# Patient Record
Sex: Female | Born: 1965
Health system: Southern US, Academic
[De-identification: ages and names within clinical notes are randomized; demographics above are authoritative.]

## PROBLEM LIST (undated history)

## (undated) ENCOUNTER — Encounter

## (undated) ENCOUNTER — Telehealth

## (undated) DIAGNOSIS — E785 Hyperlipidemia, unspecified: Secondary | ICD-10-CM

## (undated) DIAGNOSIS — G4489 Other headache syndrome: Secondary | ICD-10-CM

## (undated) DIAGNOSIS — D126 Benign neoplasm of colon, unspecified: Secondary | ICD-10-CM

## (undated) DIAGNOSIS — G43909 Migraine, unspecified, not intractable, without status migrainosus: Secondary | ICD-10-CM

## (undated) DIAGNOSIS — S86019A Strain of unspecified Achilles tendon, initial encounter: Secondary | ICD-10-CM

## (undated) DIAGNOSIS — K7689 Other specified diseases of liver: Secondary | ICD-10-CM

## (undated) DIAGNOSIS — K2970 Gastritis, unspecified, without bleeding: Secondary | ICD-10-CM

## (undated) DIAGNOSIS — K317 Polyp of stomach and duodenum: Secondary | ICD-10-CM

## (undated) DIAGNOSIS — G709 Myoneural disorder, unspecified: Secondary | ICD-10-CM

## (undated) DIAGNOSIS — E039 Hypothyroidism, unspecified: Secondary | ICD-10-CM

## (undated) DIAGNOSIS — M797 Fibromyalgia: Secondary | ICD-10-CM

## (undated) DIAGNOSIS — E079 Disorder of thyroid, unspecified: Secondary | ICD-10-CM

## (undated) DIAGNOSIS — K760 Fatty (change of) liver, not elsewhere classified: Secondary | ICD-10-CM

## (undated) HISTORY — DX: Fatty (change of) liver, not elsewhere classified: K76.0

## (undated) HISTORY — DX: Other headache syndrome: G44.89

## (undated) HISTORY — DX: Gastritis, unspecified, without bleeding: K29.70

## (undated) HISTORY — DX: Disorder of thyroid, unspecified: E07.9

## (undated) HISTORY — PX: ABDOMINAL HYSTERECTOMY: SHX81

## (undated) HISTORY — PX: PARTIAL HYSTERECTOMY: SHX80

## (undated) HISTORY — DX: Fibromyalgia: M79.7

## (undated) HISTORY — DX: Strain of unspecified achilles tendon, initial encounter: S86.019A

## (undated) HISTORY — DX: Polyp of stomach and duodenum: K31.7

## (undated) HISTORY — PX: SALPINGOOPHORECTOMY: SHX82

## (undated) HISTORY — DX: Benign neoplasm of colon, unspecified: D12.6

## (undated) HISTORY — PX: NASAL SEPTUM SURGERY: SHX37

## (undated) HISTORY — DX: Hypothyroidism, unspecified: E03.9

## (undated) HISTORY — DX: Myoneural disorder, unspecified: G70.9

## (undated) HISTORY — DX: Other specified diseases of liver: K76.89

## (undated) HISTORY — DX: Migraine, unspecified, not intractable, without status migrainosus: G43.909

## (undated) HISTORY — PX: TUBAL LIGATION: SHX77

## (undated) HISTORY — DX: Hyperlipidemia, unspecified: E78.5

## (undated) HISTORY — PX: CYSTOSCOPY: SUR368

## (undated) HISTORY — PX: DILATION AND CURETTAGE OF UTERUS: SHX78

## (undated) HISTORY — PX: CRUCIATE LIGAMENT REPAIR: SHX607

---

## 2011-03-31 ENCOUNTER — Other Ambulatory Visit (HOSPITAL_COMMUNITY)
Admission: RE | Admit: 2011-03-31 | Discharge: 2011-03-31 | Disposition: A | Discharge status: Home / Self Care | Payer: BC Managed Care – PPO | Source: Ambulatory Visit | Attending: Family Medicine | Admitting: Family Medicine

## 2011-03-31 DIAGNOSIS — Z01419 Encounter for gynecological examination (general) (routine) without abnormal findings: Secondary | ICD-10-CM | POA: Insufficient documentation

## 2012-05-31 ENCOUNTER — Other Ambulatory Visit: Payer: Self-pay | Admitting: Internal Medicine

## 2012-05-31 DIAGNOSIS — N644 Mastodynia: Secondary | ICD-10-CM

## 2012-06-05 ENCOUNTER — Ambulatory Visit
Admission: RE | Admit: 2012-06-05 | Discharge: 2012-06-05 | Disposition: A | Discharge status: Home / Self Care | Payer: BC Managed Care – PPO | Source: Ambulatory Visit | Attending: Internal Medicine | Admitting: Internal Medicine

## 2012-06-05 ENCOUNTER — Other Ambulatory Visit: Payer: BC Managed Care – PPO

## 2012-06-05 DIAGNOSIS — N644 Mastodynia: Secondary | ICD-10-CM

## 2016-03-13 ENCOUNTER — Ambulatory Visit: Attending: Family Medicine | Primary: Family Medicine

## 2016-03-13 ENCOUNTER — Ambulatory Visit

## 2016-03-13 ENCOUNTER — Inpatient Hospital Stay: Admit: 2016-03-13 | Discharge: 2016-03-14 | Primary: Family Medicine

## 2016-03-13 DIAGNOSIS — E875 Hyperkalemia: Principal | ICD-10-CM

## 2016-03-15 ENCOUNTER — Ambulatory Visit: Attending: Family Medicine | Primary: Family Medicine

## 2016-03-15 DIAGNOSIS — M797 Fibromyalgia: Principal | ICD-10-CM

## 2016-03-15 DIAGNOSIS — Z9189 Other specified personal risk factors, not elsewhere classified: Secondary | ICD-10-CM

## 2016-03-15 DIAGNOSIS — E063 Autoimmune thyroiditis: Secondary | ICD-10-CM

## 2016-03-15 DIAGNOSIS — M9901 Segmental and somatic dysfunction of cervical region: Secondary | ICD-10-CM

## 2016-03-15 DIAGNOSIS — Z6836 Body mass index (BMI) 36.0-36.9, adult: Secondary | ICD-10-CM

## 2016-03-15 DIAGNOSIS — S86019A Strain of unspecified Achilles tendon, initial encounter: Secondary | ICD-10-CM

## 2016-03-15 DIAGNOSIS — M9902 Segmental and somatic dysfunction of thoracic region: Secondary | ICD-10-CM

## 2016-03-15 DIAGNOSIS — Z8639 Personal history of other endocrine, nutritional and metabolic disease: Secondary | ICD-10-CM

## 2016-03-15 DIAGNOSIS — E039 Hypothyroidism, unspecified: Secondary | ICD-10-CM

## 2016-03-15 DIAGNOSIS — R3914 Feeling of incomplete bladder emptying: Secondary | ICD-10-CM

## 2016-03-15 DIAGNOSIS — E079 Disorder of thyroid, unspecified: Secondary | ICD-10-CM

## 2016-03-15 DIAGNOSIS — R102 Pelvic and perineal pain: Secondary | ICD-10-CM

## 2016-03-15 DIAGNOSIS — Z1231 Encounter for screening mammogram for malignant neoplasm of breast: Secondary | ICD-10-CM

## 2016-03-15 DIAGNOSIS — G43909 Migraine, unspecified, not intractable, without status migrainosus: Secondary | ICD-10-CM

## 2016-03-15 DIAGNOSIS — M9908 Segmental and somatic dysfunction of rib cage: Secondary | ICD-10-CM

## 2016-03-15 DIAGNOSIS — N946 Dysmenorrhea, unspecified: Secondary | ICD-10-CM

## 2016-03-15 NOTE — Patient Instructions
Look at the floor while you do this.  4. Stay in this position for 3-5 seconds.  5. Slowly lower your chest and your face to the floor.  GET HELP IF:  ? Your back pain gets a lot worse when you do an exercise.  ? Your back pain does not lessen 2 hours after you exercise.  If you have any of these problems, stop doing the exercises. Do not do them again unless your doctor says it is okay.  GET HELP RIGHT AWAY IF:  ? You have sudden, very bad back pain. If this happens, stop doing the exercises. Do not do them again unless your doctor says it is okay.     This information is not intended to replace advice given to you by your health care provider. Make sure you discuss any questions you have with your health care provider.     Document Released: 09/23/2010 Document Revised: 12/13/2015 Document Reviewed: 10/15/2014  Elsevier Interactive Patient Education ?2017 [employer]

## 2016-03-15 NOTE — Patient Instructions
Back Exercises  If you have pain in your back, do these exercises 2-3 times each day or as told by your doctor. When the pain goes away, do the exercises once each day, but repeat the steps more times for each exercise (do more repetitions). If you do not have pain in your back, do these exercises once each day or as told by your doctor.  EXERCISES  Single Knee to Chest  Do these steps 3-5 times in a row for each leg:  1. Lie on your back on a firm bed or the floor with your legs stretched out.  2. Bring one knee to your chest.  3. Hold your knee to your chest by grabbing your knee or thigh.  4. Pull on your knee until you feel a gentle stretch in your lower back.  5. Keep doing the stretch for 10-30 seconds.  6. Slowly let go of your leg and straighten it.  Pelvic Tilt  Do these steps 5-10 times in a row:  1. Lie on your back on a firm bed or the floor with your legs stretched out.  2. Bend your knees so they point up to the ceiling. Your feet should be flat on the floor.  3. Tighten your lower belly (abdomen) muscles to press your lower back against the floor. This will make your tailbone point up to the ceiling instead of pointing down to your feet or the floor.  4. Stay in this position for 5-10 seconds while you gently tighten your muscles and breathe evenly.  Cat-Cow  Do these steps until your lower back bends more easily:  1. Get on your hands and knees on a firm surface. Keep your hands under your shoulders, and keep your knees under your hips. You may put padding under your knees.  2. Let your head hang down, and make your tailbone point down to the floor so your lower back is round like the back of a cat.  3. Stay in this position for 5 seconds.  4. Slowly lift your head and make your tailbone point up to the ceiling so your back hangs low (sags) like the back of a cow.  5. Stay in this position for 5 seconds.  Press-Ups  Do these steps 5-10 times in a row:  1. Lie on your belly (face-down) on the floor.

## 2016-03-15 NOTE — Patient Instructions
2. Place your hands near your head, about shoulder-width apart.  3. While you keep your back relaxed and keep your hips on the floor, slowly straighten your arms to raise the top half of your body and lift your shoulders. Do not use your back muscles. To make yourself more comfortable, you may change where you place your hands.  4. Stay in this position for 5 seconds.  5. Slowly return to lying flat on the floor.  Bridges  Do these steps 10 times in a row:  1. Lie on your back on a firm surface.  2. Bend your knees so they point up to the ceiling. Your feet should be flat on the floor.  3. Tighten your butt muscles and lift your butt off of the floor until your waist is almost as high as your knees. If you do not feel the muscles working in your butt and the back of your thighs, slide your feet 1-2 inches farther away from your butt.  4. Stay in this position for 3-5 seconds.  5. Slowly lower your butt to the floor, and let your butt muscles relax.  If this exercise is too easy, try doing it with your arms crossed over your chest.  Belly Crunches  Do these steps 5-10 times in a row:  1. Lie on your back on a firm bed or the floor with your legs stretched out.  2. Bend your knees so they point up to the ceiling. Your feet should be flat on the floor.  3. Cross your arms over your chest.  4. Tip your chin a little bit toward your chest but do not bend your neck.  5. Tighten your belly muscles and slowly raise your chest just enough to lift your shoulder blades a tiny bit off of the floor.  6. Slowly lower your chest and your head to the floor.  Back Lifts  Do these steps 5-10 times in a row:  1. Lie on your belly (face-down) with your arms at your sides, and rest your forehead on the floor.  2. Tighten the muscles in your legs and your butt.  3. Slowly lift your chest off of the floor while you keep your hips on the floor. Keep the back of your head in line with the curve in your back.

## 2016-03-16 NOTE — Progress Notes
?   High Blood Pressure Father    ? Cancer Maternal Uncle    ? Heart Attack Paternal Uncle    ? Heart Attack Paternal Grandfather    ? Breast Cancer Neg Hx      Social History     Social History   ? Marital status: Married     Spouse name: N/A   ? Number of children: N/A   ? Years of education: N/A     Occupational History   ? Not on file.     Social History Main Topics   ? Smoking status: Former Smoker   ? Smokeless tobacco: Never Used   ? Alcohol use No      Comment: very rare   ? Drug use: No   ? Sexual activity: Yes     Partners: Male     Other Topics Concern   ? Not on file     Social History Narrative     Current Outpatient Prescriptions on File Prior to Visit   Medication Sig   ? cholecalciferol (VITAMIN D-3) 1000 UNITS Tablet Take 1,000 Units by mouth daily.   ? cyanocobalamin (VITAMIN B-12) 50 MCG Tablet Take by mouth daily. Pt does not know dose.   ? estradiol (ESTRACE) 0.1 MG/GM Cream Apply dime sized amount to vagina and urethra twice weekly at bedtime   ? levothyroxine (SYNTHROID, LEVOTHROID) 100 MCG Tablet Take 1 tablet by mouth daily.   ? [DISCONTINUED] PEG-3350 and electrolytes (GoLYTELY) 236 G Solution Reconstituted Use as directed     No current facility-administered medications on file prior to visit.      Allergies   Allergen Reactions   ? Shellfish Allergy Itching and Swelling   ? Azithromycin Rash     Takes benadryl with this medication.   ? Erythromycin Rash     Takes benadryl with this medication.         Review of Systems  Review of Systems  CONSTITUTIONAL: Appetite good, no fevers, improved fatigue or weight loss, no headache  CV: One episode of atypical chest pain, palpitations, PND or peripheral edema  RESPIRATORY: No cough, shortness of breath, wheezing or dyspnea  GI: No nausea/vomiting, change in bowel habits, blood in stool, dysphagia, heartburn or abdominal pain  GU: No dysuria, urgency or incontinence  MUSC: upper back pain   SKIN: resolved lesion on left cheek

## 2016-03-16 NOTE — Progress Notes
Subjective:   Angela Andersen is a 50 y.o. 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 She is drinking more water and feels like it helps.  She states she is working on it.    She had one episode of chest pain doing dishes in April 2017. She was doing dishes.   Improved with rest.  She was able to relax with breathing.  Had one episode very similar six years ago that per [name] was worked up and came back as nothing.  No current pain.  No pain with exercise or sob.  No primary relatives with heart disease    She is down 2lbs    We adjusted her thyroid medications. She had some pain for a couple months and was able to work through it. She is now euthyroid.  She has cut back on choclate.      She states the spot we froze is resolved    She has fibromyalgia.  She is not going to [facility].          The 10-year ASCVD risk score Denman George(Goff DC Jorge NyJr, et al., 2013) is: 1.2%    Values used to calculate the score:      Age: [age] years      Sex: Female      Is Non-Hispanic African American: No      Diabetic: No      Tobacco smoker: No      Systolic Blood Pressure: 118 mmHg      Is BP treated: No      HDL Cholesterol: 60 mg/dL      Total Cholesterol: 211 mg/dL      Results for Angela ElyBAKER, Sidney (MRN [id number]) as of 03/15/2016 09:43   Ref. Range 02/09/2016 07:50 02/24/2016 09:10 03/13/2016 10:10   SODIUM Latest Ref Range: 135 - 145 mmol/L 143 140 142   POTASSIUM, SERUM Latest Ref Range: 3.3 - 4.6 mmol/L 5.6 (H) 5.4 (H) 4.8 (H)   CHLORIDE, SERUM Latest Ref Range: 101 - 110 mmol/L 103 99 (L) 101   CARBON DIOXIDE TOTAL Latest Ref Range: 21 - 29 mmol/L 28 28 28    Urea Nitrogen Latest Ref Range: 6 - 22 mg/dL 11 8 12    CREATININE Latest Ref Range: 0.51 - 0.95 mg/dL 5.400.79 9.810.71 1.910.78   GLUCOSE, SERUM Latest Ref Range: 71 - 99 mg/dL 478100 (H) 88 71

## 2016-03-16 NOTE — Progress Notes
nRBC % Latest Ref Range: 0.0 - 1.0 % 0.0     Basophils % Latest Ref Range: 0 - 1 % 1.1 (H)     Neutrophils Absolute Latest Ref Range: 1.80 - 8.70 x10E3/uL 2.97     Lymphocytes Absolute Latest Units: x10E3/uL 1.84     Monocytes Absolute Latest Units: x10E3/uL 0.37     Eosinophils Absolute Latest Units: x10E3/uL 0.09     Basophils Absolute Latest Units: x10E3/uL 0.06     Absolute NRBC Count Unknown 0.00     CBC AND DIFFERENTIAL Unknown Rpt (A)       Past Medical History:   Diagnosis Date   ? Dysmenorrhea    ? Feeling of incomplete bladder emptying    ? Fibromyalgia    ? H/O hyperkalemia    ? History of general anesthesia complication     VERY sensitive to anesthesia   ? Hypothyroid    ? Migraines     hemiplegic migraine   ? Partial tear of Achilles tendon     left   ? Pelvic pain     before hysterectomy   ? Thyroid disease      Past Surgical History:   Procedure Laterality Date   ? COLONOSCOPY     ? COLONOSCOPY W/ OR W/O BIOPSY N/A 01/27/2016    COLONOSCOPY W/ OR W/O BIOPSY performed by Noel GeroldEid, Amalie F, [facility]   ? CYSTOSCOPY      Surgery Description: Cystoscopy (Diagnostic);  Problem Comments: 12/16/12 (Created by Conversion)   ? DILATION AND CURETTAGE OF UTERUS      Surgery Description: Dilation And Curettage;  Problem Comments: for heavy bleeding after second delivery (Created by Conversion)   ? HYSTERECTOMY     ? NASAL SEPTUM SURGERY      Surgery Description: Nasal Septal Deviation Repair;   (Created by Conversion)   ? OTHER SURGICAL HISTORY      Surgery Description: Laparoscopy With Total Hysterectomy;  Problem Comments: excision of endometriosis-12/16/12 (Created by Conversion)   ? SALPINGO-OOPHORECTOMY      Surgery Description: Salpingo-oophorectomy Left Side;  Problem Comments: 12/16/12 (Created by Conversion)   ? TUBAL LIGATION     ? TUBAL LIGATION      Surgery Description: Tubal Ligation;   (Created by Conversion)     Family History   Problem Relation Age of Onset   ? High Blood Pressure Mother

## 2016-03-16 NOTE — Progress Notes
2. Visit for screening mammogram Z12.31 V76.12 MAM Screening   3. Somatic dysfunction of cervical region M99.01 739.1    4. Somatic dysfunction of thoracic region M99.02 739.2    5. Somatic dysfunction of rib M99.08 739.8    6. BMI 36.0-36.9,adult Z68.36 V85.36    7. Chronic lymphocytic thyroiditis E06.3 245.2           Plan:   Plan   1.  Thyroid disorder  -Improved and euthyroid on synthroid 100mcg  -recheck labs in 12 weeks     2. Fibromyalgia  -stable  -improving with adjustment of thyroid  -pnt will self refer to [facility]  -work on 30 minutes of activity daily  -work on gluten free diet  -call if worsening  -email consider gabapentin or flexerill         3. Somatic dysfunction cspine tspine and ribs  -Verbal consent given to the patient who consents to the procedure  -Chaperone is: Milbert CoulterLatesha Harmon  -Osteopathic Manipulative Treatment using Soft tissue techniques, Muscle Energy (ME) and High Velocity Low Amplitude (HVLA)  to affected areas  -Pnt is 50 %better  -Stretches given  -Recommend foam rolling  -Pnt instructed to email or call if not improving or worsening consider further workup        4. Hep C screening ordered        5.  Hyperkalemia  -improved  -hydrate well  -consider hctz    No orders of the following type(s) were placed in this encounter: Medications.     Orders Placed This Encounter   Procedures   ? MAM Screening       Health Maintenance was reviewed. The patient's HM Topic list was:                                            Health Maintenance   Topic Date Due   ? DTaP,Tdap,and Td Vaccines (1 - Tdap) 06/15/2016 (Originally 12/19/1984)   ? Mammogram Discussion  06/15/2016 (Originally 10/24/2014)   ? Influenza Vaccine (1) 05/05/2016   ? Preventive Wellness Visit  02/08/2017   ? Colonoscopy  01/26/2021   ? Lipid Profile  02/08/2021   ? USPSTF HIV Risk Assessment  Completed   ? USPSTF Hepatitis C Screening  Completed     No changes made.

## 2016-03-16 NOTE — Progress Notes
CALCIUM, SERUM Latest Ref Range: 8.6 - 10.0 mg/dL 10.2 (H) 9.7 9.7   Calc Total Globuin Latest Units: gm/dL 2.8     ANION GAP Latest Ref Range: 4 - 16 mmol/L _0 BUN/Creatinine Ratio Latest Ref Range: 6.0 - 22.0 (calc) 13.9 11.3 15.4   TOTAL PROTEIN Latest Ref Range: 6.5 - 8.3 g/dL 7.2     ALBUMIN Latest Ref Range: 3.8 - 4.9 g/dL 4.4     ALBUMIN/GLOBULIN RATIO Latest Units: (calc) 1.6     AST Latest Ref Range: 14 - 33 IU/L 14     ALT Latest Ref Range: 10 - 42 IU/L 17     EGFR Latest Units: mL/min/1.73M2 >59 >59 >59   Total Bilirubin Latest Ref Range: 0.2 - 1.0 mg/dL 0.4     Alkaline Phosphatase Latest Ref Range: 35 - 104 IU/L 93     Osmolality Calc Unknown 284.5 277.1 281.4   Ferritin Latest Ref Range: 15.0 - 150.0 ng/ml 105.4     Cholesterol, Total Latest Ref Range: 100 - 199 mg/dL 211 (H)     HDL Latest Ref Range: >=60 mg/dL 60     LDL Calculated Latest Units: mg/dL 132     NON-HDL CHOLESTEROL Latest Units: mg/dL (calc) 151     Triglycerides Latest Ref Range: <=150 mg/dL 96     Vitamin B-12 Latest Ref Range: 211 - 946 pg/ml 369     T4, Total Latest Ref Range: 4.6 - 12 mcg/dL 9.1     TSH, 3RD GENERATION Latest Ref Range: 0.270 - 4.200 mIU/L 0.637     WBC Latest Ref Range: 4.5 - 11 x10E3/uL 5.35     RBC Latest Ref Range: 4.20 - 5.40 x10E6/uL 4.58     HEMOGLOBIN Latest Ref Range: 12.0 - 16.0 g/dL 13.3     HEMATOCRIT Latest Ref Range: 37.0 - 47.0 % 41.4     MCV Latest Ref Range: 82.0 - 101.0 fl 90.4     MCH Latest Ref Range: 27.0 - 34.0 pg 29.0     MCHC Latest Ref Range: 31.0 - 36.0 g/dL 32.1     RDW Latest Ref Range: 12.0 - 16.1 % 13.3     PLATELET COUNT Latest Ref Range: 140 - 440 thou/cu mm 340     MPV Latest Ref Range: 9.5 - 11.5 fl 10.9     Neutrophils Latest Ref Range: 34.0 - 73.0 % 55.5     IMMATURE GRANULOCYTES Latest Ref Range: 0.0 - 2.0 % 0.4     Lymphocytes % Latest Ref Range: 25.0 - 45.0 % 34.4     MONOCYTES Latest Ref Range: 2.0 - 6.0 % 6.9 (H)     EOSINOPHILS Latest Ref Range: 1.0 - 4.0 % 1.7

## 2016-03-16 NOTE — Progress Notes
NEURO: No dizziness, numbness, MS changes, motor weakness, or syncope    Objective:        VITAL SIGNS (all recorded)      Clinic Vitals       03/15/16 0854             Amb Encounter Vitals    Weight 101.2 kg (223 lb)    -MD at 03/15/16 0858       Height 1.676 m (5\' 6" )    -MD at 03/15/16 0858       BMI (Calculated) 36.07    -MD at 03/15/16 0858       BSA (Calculated - sq m) 2.17    -MD at 03/15/16 0858       BP 118/80    -MD at 03/15/16 0858       BP Location Left lower arm    -MD at 03/15/16 0858       Position Sitting    -MD at 03/15/16 0858       Pulse 79    -MD at 03/15/16 0858       Resp 16    -MD at 03/15/16 0858       Temp 36.4 ?C (97.5 ?F)    -MD at 03/15/16 0858       Temperature Source Oral    -MD at 03/15/16 0858       O2 Saturation 98 %    -MD at 03/15/16 0858       FiO2 Source RA    -MD at 03/15/16 0858       Pain Score Zero    -MD at 03/15/16 0858       Education/Communication Barriers?    Learning/Communication Barriers? No    -MD at 03/15/16 0858       Fall Risk Assessment    Had recent fall / Last 6 months? No recent fall    -MD at 03/15/16 0858       Does patient have a fear of falling? No    -MD at 03/15/16 [phone]         User Key  (r) = Recorded By, (t) = Taken By, (c) = Cosigned By    Initials Name Effective Dates    MD Kennis Carinaonovan, Mary A, MA 12/07/15 -         Physical Exam  GENERAL: Vitals reviewed and patient is in NAD  HEENT: Neck supple. No lymphadenopathy.   LUNGS: Clear to auscultation bilaterally, no wheezes with forced expiration, respirations unlabored.  HEART: Regular rate and rhythm,  no ectopic beats or murmur  ABDOMEN: Non-tender, no organomegaly, no palpable masses or lesions.   EXTREMITIES: No edema.    MUSC: C7-T4RlSl with rib dysfunction   NEURO: Gait normal.   MENTAL STATUS: Alert, normal MS. Answers all questions appropriately.        Procedures  none   Assessment:       ICD-10-CM ICD-9-CM    1. Fibromyalgia M79.7 729.1

## 2016-03-23 ENCOUNTER — Inpatient Hospital Stay: Admit: 2016-03-23 | Discharge: 2016-03-24 | Primary: Family Medicine

## 2016-03-23 DIAGNOSIS — Z1231 Encounter for screening mammogram for malignant neoplasm of breast: Principal | ICD-10-CM

## 2016-03-23 DIAGNOSIS — R921 Mammographic calcification found on diagnostic imaging of breast: Secondary | ICD-10-CM

## 2016-03-23 LAB — HM MAMMOGRAPHY

## 2016-06-04 DIAGNOSIS — K760 Fatty (change of) liver, not elsewhere classified: Secondary | ICD-10-CM

## 2016-06-04 HISTORY — DX: Fatty (change of) liver, not elsewhere classified: K76.0

## 2016-06-12 ENCOUNTER — Ambulatory Visit: Attending: Family Medicine | Primary: Family Medicine

## 2016-06-12 DIAGNOSIS — D649 Anemia, unspecified: Principal | ICD-10-CM

## 2016-06-12 DIAGNOSIS — E039 Hypothyroidism, unspecified: Secondary | ICD-10-CM

## 2016-06-12 LAB — BASIC METABOLIC PANEL
BUN: 12 (ref 4–21)
Creatinine: 0.8 (ref 0.5–1.1)
GLUCOSE: 94
POTASSIUM: 4.5 (ref 3.4–5.3)
SODIUM: 143 (ref 137–147)

## 2016-06-12 LAB — HEPATIC FUNCTION PANEL
ALT: 20 (ref 7–35)
AST: 18 (ref 13–35)
Alkaline Phosphatase: 94 (ref 25–125)
BILIRUBIN, TOTAL: 0.5

## 2016-06-12 LAB — CBC AND DIFFERENTIAL
HCT: 42 (ref 36–46)
Hemoglobin: 13 (ref 12.0–16.0)
Platelets: 336 (ref 150–399)
WBC: 5.4

## 2016-06-12 LAB — FERRITIN: Ferritin: 123.7

## 2016-06-12 LAB — LIPID PANEL
Cholesterol: 230 — AB (ref 0–200)
HDL: 65 (ref 35–70)
LDL Cholesterol: 140
Triglycerides: 124 (ref 40–160)

## 2016-06-12 LAB — VITAMIN B12: VITAMIN B 12: 336

## 2016-06-12 LAB — TSH: TSH: 1.49 (ref 0.41–5.90)

## 2016-06-12 LAB — T4, FREE: FREE T4: 1.3

## 2016-06-13 ENCOUNTER — Inpatient Hospital Stay: Admit: 2016-06-13 | Discharge: 2016-06-13 | Primary: Family Medicine

## 2016-06-15 ENCOUNTER — Ambulatory Visit: Attending: Family Medicine | Primary: Family Medicine

## 2016-06-15 DIAGNOSIS — E039 Hypothyroidism, unspecified: Secondary | ICD-10-CM

## 2016-06-15 DIAGNOSIS — R3914 Feeling of incomplete bladder emptying: Secondary | ICD-10-CM

## 2016-06-15 DIAGNOSIS — R0789 Other chest pain: Principal | ICD-10-CM

## 2016-06-15 DIAGNOSIS — M797 Fibromyalgia: Secondary | ICD-10-CM

## 2016-06-15 DIAGNOSIS — R5383 Other fatigue: Secondary | ICD-10-CM

## 2016-06-15 DIAGNOSIS — Z9189 Other specified personal risk factors, not elsewhere classified: Secondary | ICD-10-CM

## 2016-06-15 DIAGNOSIS — N3943 Post-void dribbling: Secondary | ICD-10-CM

## 2016-06-15 DIAGNOSIS — N946 Dysmenorrhea, unspecified: Secondary | ICD-10-CM

## 2016-06-15 DIAGNOSIS — Z8639 Personal history of other endocrine, nutritional and metabolic disease: Secondary | ICD-10-CM

## 2016-06-15 DIAGNOSIS — E079 Disorder of thyroid, unspecified: Secondary | ICD-10-CM

## 2016-06-15 DIAGNOSIS — G43909 Migraine, unspecified, not intractable, without status migrainosus: Secondary | ICD-10-CM

## 2016-06-15 DIAGNOSIS — S86019A Strain of unspecified Achilles tendon, initial encounter: Secondary | ICD-10-CM

## 2016-06-15 DIAGNOSIS — Z6836 Body mass index (BMI) 36.0-36.9, adult: Secondary | ICD-10-CM

## 2016-06-15 DIAGNOSIS — R102 Pelvic and perineal pain: Secondary | ICD-10-CM

## 2016-06-15 NOTE — Patient Instructions
Your chest pain may come and go, or it may stay constant.  DIAGNOSIS  Lab tests or other studies may be needed to find the cause of your pain. Your health care provider may have you take a test called an ambulatory ECG (electrocardiogram). An ECG records your heartbeat patterns at the time the test is performed. You may also have other tests, such as:  ? Transthoracic echocardiogram (TTE). During echocardiography, sound waves are used to create a picture of all of the heart structures and to look at how blood flows through your heart.  ? Transesophageal echocardiogram (TEE).?This is a more advanced imaging test that obtains images from inside your body. It allows your health care provider to see your heart in finer detail.  ? Cardiac monitoring. This allows your health care provider to monitor your heart rate and rhythm in real time.  ? Holter monitor. This is a portable device that records your heartbeat and can help to diagnose abnormal heartbeats. It allows your health care provider to track your heart activity for several days, if needed.  ? Stress tests. These can be done through exercise or by taking medicine that makes your heart beat more quickly.  ? Blood tests.  ? Imaging tests.  TREATMENT   Your treatment depends on what is causing your chest pain. Treatment may include:  ? Medicines. These may include:    Acid blockers for heartburn.    Anti-inflammatory medicine.    Pain medicine for inflammatory conditions.    Antibiotic medicine, if an infection is present.    Medicines to dissolve blood clots.    Medicines to treat coronary artery disease.  ? Supportive care for conditions that do not require medicines. This may include:    Resting.    Applying heat or cold packs to injured areas.    Limiting activities until pain decreases.  HOME CARE INSTRUCTIONS  ? If you were prescribed an antibiotic medicine, finish it all even if you start to feel better.  ? Avoid any activities that bring on chest pain.

## 2016-06-15 NOTE — Patient Instructions
Nonspecific Chest Pain   Chest pain can be caused by many different conditions. There is always a chance that your pain could be related to something serious, such as a heart attack or a blood clot in your lungs. Chest pain can also be caused by conditions that are not life-threatening. If you have chest pain, it is very important to follow up with your health care provider.  CAUSES   Chest pain can be caused by:  ? Heartburn.  ? Pneumonia or bronchitis.  ? Anxiety or stress.  ? Inflammation around your heart (pericarditis) or lung (pleuritis or pleurisy).  ? A blood clot in your lung.  ? A collapsed lung (pneumothorax). It can develop suddenly on its own (spontaneous pneumothorax) or from trauma to the chest.  ? Shingles infection (varicella-zoster virus).  ? Heart attack.  ? Damage to the bones, muscles, and cartilage that make up your chest wall. This can include:    Bruised bones due to injury.    Strained muscles or cartilage due to frequent or repeated coughing or overwork.    Fracture to one or more ribs.    Sore cartilage due to inflammation (costochondritis).  RISK FACTORS   Risk factors for chest pain may include:  ? Activities that increase your risk for trauma or injury to your chest.  ? Respiratory infections or conditions that cause frequent coughing.  ? Medical conditions or overeating that can cause heartburn.  ? Heart disease or family history of heart disease.  ? Conditions or health behaviors that increase your risk of developing a blood clot.  ? Having had chicken pox (varicella zoster).  SIGNS AND SYMPTOMS  Chest pain can feel like:  ? Burning or tingling on the surface of your chest or deep in your chest.  ? Crushing, pressure, aching, or squeezing pain.  ? Dull or sharp pain that is worse when you move, cough, or take a deep breath.  ? Pain that is also felt in your back, neck, shoulder, or arm, or pain that spreads to any of these areas.

## 2016-06-15 NOTE — Patient Instructions
?   Do not use any tobacco products, including cigarettes, chewing tobacco, or electronic cigarettes. If you need help quitting, ask your health care provider.  ? Do not drink alcohol.  ? Take medicines only as directed by your health care provider.  ? Keep all follow-up visits as directed by your health care provider. This is important. This includes any further testing if your chest pain does not go away.  ? If heartburn is the cause for your chest pain, you may be told to keep your head raised (elevated) while sleeping. This reduces the chance that acid will go from your stomach into your esophagus.  ? Make lifestyle changes as directed by your health care provider. These may include:    Getting regular exercise. Ask your health care provider to suggest some activities that are safe for you.    Eating a heart-healthy diet. A registered dietitian can help you to learn healthy eating options.    Maintaining a healthy weight.    Managing diabetes, if necessary.    Reducing stress.  SEEK MEDICAL CARE IF:  ? Your chest pain does not go away after treatment.  ? You have a rash with blisters on your chest.  ? You have a fever.  SEEK IMMEDIATE MEDICAL CARE IF:   ? Your chest pain is worse.  ? You have an increasing cough, or you cough up blood.  ? You have severe abdominal pain.  ? You have severe weakness.  ? You faint.  ? You have chills.  ? You have sudden, unexplained chest discomfort.  ? You have sudden, unexplained discomfort in your arms, back, neck, or jaw.  ? You have shortness of breath at any time.  ? You suddenly start to sweat, or your skin gets clammy.  ? You feel nauseous or you vomit.  ? You suddenly feel light-headed or dizzy.  ? Your heart begins to beat quickly, or it feels like it is skipping beats.  These symptoms may represent a serious problem that is an emergency. Do not wait to see if the symptoms will go away. Get medical help right away.

## 2016-06-15 NOTE — Patient Instructions
Call your local emergency services (911 in the [place] Do not drive yourself to the hospital.     This information is not intended to replace advice given to you by your health care provider. Make sure you discuss any questions you have with your health care provider.     Document Released: 05/31/2005 Document Revised: 09/11/2014 Document Reviewed: 03/27/2014  Elsevier Interactive Patient Education ?2017 [employer]

## 2016-06-16 NOTE — Progress Notes
?   Not on file.     Social History Main Topics   ? Smoking status: Former Smoker   ? Smokeless tobacco: Never Used   ? Alcohol use No      Comment: very rare   ? Drug use: No   ? Sexual activity: Yes     Partners: Male     Other Topics Concern   ? Not on file     Social History Narrative     Current Outpatient Prescriptions on File Prior to Visit   Medication Sig   ? cholecalciferol (VITAMIN D-3) 1000 UNITS Tablet Take 1,000 Units by mouth daily.   ? cyanocobalamin (VITAMIN B-12) 50 MCG Tablet Take by mouth daily. Pt does not know dose.   ? estradiol (ESTRACE) 0.1 MG/GM Cream Apply dime sized amount to vagina and urethra twice weekly at bedtime   ? levothyroxine (SYNTHROID, LEVOTHROID) 100 MCG Tablet Take 1 tablet by mouth daily.     No current facility-administered medications on file prior to visit.      Allergies   Allergen Reactions   ? Shellfish Allergy Itching and Swelling   ? Azithromycin Rash     Takes benadryl with this medication.   ? Erythromycin Rash     Takes benadryl with this medication.         Review of Systems  Review of Systems  CONSTITUTIONAL: Appetite good, no fevers, worse daytime fatigue or weight loss, no headache  CV: Last week she had one episode of atypical chest pain, palpitations, PND or peripheral edema  RESPIRATORY: No cough, shortness of breath, wheezing or dyspnea  GI: No nausea/vomiting, change in bowel habits, blood in stool, dysphagia, heartburn or abdominal pain  GU: No dysuria, urgency increased smell and post void occasional incontinence  MUSC: + occasional myalgias  NEURO: No dizziness, numbness, MS changes, motor weakness, or syncope    Objective:        VITAL SIGNS (all recorded)      Clinic Vitals       06/15/16 0842             Amb Encounter Vitals    Weight 101.2 kg (223 lb)    -LD at 06/15/16 0843       Height 1.676 m (5\' 6" )    -LD at 06/15/16 0843       BMI (Calculated) 36.07    -LD at 06/15/16 0843       BSA (Calculated - sq m) 2.17    -LD at 06/15/16 [id number]

## 2016-06-16 NOTE — Progress Notes
BP 124/79    -LD at 06/15/16 0843       BP Location Right upper arm    -LD at 06/15/16 0843       Position Sitting    -LD at 06/15/16 0843       Pulse 68    -LD at 06/15/16 0843       Resp 14    -LD at 06/15/16 0843       Temp 36.2 ?C (97.1 ?F)    -LD at 06/15/16 0843       Temperature Source Oral    -LD at 06/15/16 0843       O2 Saturation 99 %    -LD at 06/15/16 0843       Pain Score Zero    -LD at 06/15/16 16100843         User Key  (r) = Recorded By, (t) = Taken By, (c) = Cosigned By    Initials Name Effective Dates    LD Cordelia Pocheavis, [place], MA 12/07/15 -         Physical Exam  GENERAL: Vitals reviewed and patient is in NAD  Pulse ox 99% on RA BMI 36  HEENT: Neck supple. No lymphadenopathy.   LUNGS: Clear to auscultation bilaterally, no wheezes with forced expiration, respirations unlabored.  HEART: Regular rate and rhythm,  no ectopic beats or murmur  ABDOMEN: Mild suprapubic ttp no r/r/g no organomegaly, no palpable masses or lesions.   EXTREMITIES: No edema.    NEURO: Gait normal.   MENTAL STATUS: Alert, normal MS. Answers all questions appropriately.        Procedures  none   Assessment:       ICD-10-CM ICD-9-CM    1. Atypical chest pain R07.89 786.59 Refer to Cardiology   2. Fatigue, unspecified type R53.83 780.79    3. BMI 36.0-36.9,adult Z68.36 V85.36    4. Post-void dribbling N39.43 788.35 Refer to Physical Therapy      Urinalysis W/Microscopy      Culture, Urine   5. Fibromyalgia M79.7 729.1 Refer to Physical Therapy   6. Hypothyroidism, unspecified type E03.9 244.9           Plan:   Plan   1.  Thyroid disorder  -Euthyroid  -continue synthroid 100mcg  -recheck labs in 12 weeks     2. Fibromyalgia  -stable  -doing well   -work on 30 minutes of activity daily  -work on gluten free diet  -call if worsening  -email consider gabapentin or flexerill         3. Chest pain second episode since April and increasing daytime fatigue  -no current symptoms  -refer to cardiology  -ekg NSR v3 has borderline t waves

## 2016-06-16 NOTE — Progress Notes
-  reviewed limits of cardio workup at our office  -start baby asa  -er precautions given  -if cardiac workup neg consider sleep eval   -no current complaints         4.  Urine has ammonia smell and has some incontinence  -urine culture and UA today  -refer to pelvic pt  -consider urogyn   Patient given precautions that illnesses may evolve and that they should seek immediate medical treatment if they have:  Worsening fever   Shortness of breath   Vomiting and inability to keep down liquids   Severe pain or worsening pain   Mental status changes  No improvement in 3-5 days   Worsening of symptoms   Any adverse affects they feel is due to any of their current medications     5. BMI 36  Dimitra's Estimated body mass index is 35.99 kg/(m^2) as calculated from the following:    Height as of this encounter: 1.676 m (5\' 6" ).    Weight as of this encounter: 101.2 kg (223 lb).      The health risks associated with an elevated BMI were discussed and education was provided in the AVS.  See orders for any further follow up plans.        No orders of the following type(s) were placed in this encounter: Medications.     Orders Placed This Encounter   Procedures   ? Culture, Urine   ? Urinalysis W/Microscopy   ? Refer to Cardiology   ? Refer to Physical Therapy       Health Maintenance was reviewed. The patient's HM Topic list was:                                            Health Maintenance   Topic Date Due   ? DTaP,Tdap,and Td Vaccines (1 - Tdap) 06/15/2016 (Originally [birth year]19/1984)   ? Preventive Wellness Visit  02/08/2017   ? Breast Cancer Screening  03/23/2018   ? Lipid Profile  06/12/2021   ? Colon Cancer Screening  01/26/2026   ? USPSTF HIV Risk Assessment  Completed   ? USPSTF Hepatitis C Screening  Completed   ? Influenza Vaccine  Completed     No changes made.

## 2016-06-16 NOTE — Progress Notes
Electronically Verified By Cristal Deer- Cassandra Fernandez M.D.    Released Date Time - 03/23/2016 10:33 AM    Resident -   ?  Past Medical History:   Diagnosis Date   ? Dysmenorrhea    ? Feeling of incomplete bladder emptying    ? Fibromyalgia    ? H/O hyperkalemia    ? History of general anesthesia complication     VERY sensitive to anesthesia   ? Hypothyroid    ? Migraines     hemiplegic migraine   ? Partial tear of Achilles tendon     left   ? Pelvic pain     before hysterectomy   ? Thyroid disease      Past Surgical History:   Procedure Laterality Date   ? COLONOSCOPY     ? COLONOSCOPY W/ OR W/O BIOPSY N/A 01/27/2016    COLONOSCOPY W/ OR W/O BIOPSY performed [facility] Noel GeroldEid, Amalie F[facility]   ? CYSTOSCOPY      Surgery Description: Cystoscopy (Diagnostic);  Problem Comments: 12/16/12 (Created by Conversion)   ? DILATION AND CURETTAGE OF UTERUS      Surgery Description: Dilation And Curettage;  Problem Comments: for heavy bleeding after second delivery (Created by Conversion)   ? HYSTERECTOMY     ? NASAL SEPTUM SURGERY      Surgery Description: Nasal Septal Deviation Repair;   (Created by Conversion)   ? OTHER SURGICAL HISTORY      Surgery Description: Laparoscopy With Total Hysterectomy;  Problem Comments: excision of endometriosis-12/16/12 (Created by Conversion)   ? SALPINGO-OOPHORECTOMY      Surgery Description: Salpingo-oophorectomy Left Side;  Problem Comments: 12/16/12 (Created by Conversion)   ? TUBAL LIGATION     ? TUBAL LIGATION      Surgery Description: Tubal Ligation;   (Created by Conversion)     Family History   Problem Relation Age of Onset   ? High Blood Pressure Mother    ? High Blood Pressure Father    ? Cancer Maternal Uncle    ? Heart Attack Paternal Uncle    ? Heart Attack Paternal Grandfather    ? Breast Cancer Neg Hx      Social History     Social History   ? Marital status: Married     Spouse name: N/A   ? Number of children: N/A   ? Years of education: N/A     Occupational History

## 2016-06-16 NOTE — Progress Notes
CALCIUM, SERUM Latest Ref Range: 8.6 - 10.0 mg/dL 10.2 (H) 9.7 9.7  10.1 (H)   Calc Total Globuin Latest Units: gm/dL 2.8    3.1   ANION GAP Latest Ref Range: 4 - 16 mmol/L _0 (H)   BUN/Creatinine Ratio Latest Ref Range: 6.0 - 22.0 (calc) 13.9 11.3 15.4  15.8   TOTAL PROTEIN Latest Ref Range: 6.5 - 8.3 g/dL 7.2    7.4   ALBUMIN Latest Ref Range: 3.8 - 4.9 g/dL 4.4    4.3   ALBUMIN/GLOBULIN RATIO Latest Units: (calc) 1.6    1.4   AST Latest Ref Range: 14 - 33 IU/L 14    18   ALT Latest Ref Range: 10 - 42 IU/L 17    20   EGFR Latest Units: mL/min/1.73M2 >59 >59 >59  >59   Total Bilirubin Latest Ref Range: 0.2 - 1.0 mg/dL 0.4    0.5   Alkaline Phosphatase Latest Ref Range: 35 - 104 IU/L 93    94   Osmolality Calc Unknown 284.5 277.1 281.4  284.5   Ferritin Latest Ref Range: 15.0 - 150.0 ng/ml 105.4    123.7   Cholesterol, Total Latest Ref Range: 100 - 199 mg/dL 211 (H)    230 (H)   HDL Latest Ref Range: >=60 mg/dL 60    65   LDL Calculated Latest Units: mg/dL 132    140   NON-HDL CHOLESTEROL Latest Units: mg/dL (calc) 151    165   Triglycerides Latest Ref Range: <=150 mg/dL 96    124   Vitamin B-12 Latest Ref Range: 211 - 946 pg/ml 369    336   Free T4 Latest Ref Range: 0.80 - 1.70 ng/dL     1.30   T4, Total Latest Ref Range: 4.6 - 12 mcg/dL 9.1       TSH, 3RD GENERATION Latest Ref Range: 0.270 - 4.200 mIU/L 0.637    1.490   WBC Latest Ref Range: 4.5 - 11 x10E3/uL 5.35    5.35   RBC Latest Ref Range: 4.20 - 5.40 x10E6/uL 4.58    4.69   HEMOGLOBIN Latest Ref Range: 12.0 - 16.0 g/dL 13.3    13.0   HEMATOCRIT Latest Ref Range: 37.0 - 47.0 % 41.4    42.3   MCV Latest Ref Range: 82.0 - 101.0 fl 90.4    90.2   MCH Latest Ref Range: 27.0 - 34.0 pg 29.0    27.7   MCHC Latest Ref Range: 31.0 - 36.0 g/dL 32.1    30.7 (L)   RDW Latest Ref Range: 12.0 - 16.1 % 13.3    13.8   PLATELET COUNT Latest Ref Range: 140 - 440 thou/cu mm 340    336   MPV Latest Ref Range: 9.5 - 11.5 fl 10.9    10.6

## 2016-06-16 NOTE — Progress Notes
Subjective:   Angela Andersen is a 50 y.o. 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  She has hypothyroidism.  TSH is 1.49    She has hyperlipidemia.  Her 10 year cardiac risk is 1.4%.  LDL is 140    She has no one more episode of chest pain.  She had one stabbing episode of chest pain last week.  Had last episode in April. She can relax it with breathing.     She has no current complaints.  No sob or chest pain with activity. She gets tired with activity.  She is napping some more than usual. She is not snoring at all per her husband     She had a flu shot at work    She has fibromyalgia.  She states she is stable. She is sleeping better. Wakes up with lots of energy. During the day she gets tired midday        The 10-year ASCVD risk score Denman George(Goff DC Jorge NyJr, et al., 2013) is: 1.4%    Values used to calculate the score:      Age: [age] years      Sex: Female      Is Non-Hispanic African American: No      Diabetic: No      Tobacco smoker: No      Systolic Blood Pressure: 124 mmHg      Is BP treated: No      HDL Cholesterol: 65 mg/dL      Total Cholesterol: 230 mg/dL      Results for Angela Andersen, Angela Andersen (MRN [id number]) as of 06/15/2016 09:10   Ref. Range 02/09/2016 07:50 02/24/2016 09:10 03/13/2016 10:10 03/23/2016 08:31 06/12/2016 07:45   SODIUM Latest Ref Range: 135 - 145 mmol/L 143 140 142  143   POTASSIUM, SERUM Latest Ref Range: 3.3 - 4.6 mmol/L 5.6 (H) 5.4 (H) 4.8 (H)  4.5   CHLORIDE, SERUM Latest Ref Range: 101 - 110 mmol/L 103 99 (L) 101  101   CARBON DIOXIDE TOTAL Latest Ref Range: 21 - 29 mmol/L 28 28 28  24    Urea Nitrogen Latest Ref Range: 6 - 22 mg/dL 11 8 12  12    CREATININE Latest Ref Range: 0.51 - 0.95 mg/dL 8.290.79 5.620.71 1.300.78  8.650.76   GLUCOSE, SERUM Latest Ref Range: 71 - 99 mg/dL 784100 (H) 88 71  94

## 2016-06-16 NOTE — Progress Notes
Neutrophils Latest Ref Range: 34.0 - 73.0 % 55.5    59.8   IMMATURE GRANULOCYTES Latest Ref Range: 0.0 - 2.0 % 0.4    0.4   Lymphocytes % Latest Ref Range: 25.0 - 45.0 % 34.4    32.0   MONOCYTES Latest Ref Range: 2.0 - 6.0 % 6.9 (H)    6.0   EOSINOPHILS Latest Ref Range: 1.0 - 4.0 % 1.7    1.1   nRBC % Latest Ref Range: 0.0 - 1.0 % 0.0    0.0   Basophils % Latest Ref Range: 0 - 1 % 1.1 (H)    0.7   Neutrophils Absolute Latest Ref Range: 1.80 - 8.70 x10E3/uL 2.97    3.20   Lymphocytes Absolute Latest Units: x10E3/uL 1.84    1.71   Monocytes Absolute Latest Units: x10E3/uL 0.37    0.32   Eosinophils Absolute Latest Units: x10E3/uL 0.09    0.06   Basophils Absolute Latest Units: x10E3/uL 0.06    0.04   Absolute NRBC Count Unknown 0.00    0.00   CBC AND DIFFERENTIAL Unknown Rpt (A)    Rpt (A)         Bilateral Digital Screening Mammogram   ?  Comparison: 2015  ?  History: 50 year old female with no current complaints.  ?  Routine digital cranial caudal and MLO views were obtained.  ?  Findings: Scattered fibroglandular densities are seen.  No masses are identified.  There are no   abnormal clustered microcalcifications.  A few scattered benign-appearing calcifications are   noted.No evidence of architectural distortion or skin thickening identified.  ?  CAD was also utilized.  ?  IMPRESSION:  Impression: BIRADS:2, benign findings.  Annual mammography is recommended.  ?  ?  NOTE:  ?  An x-ray report which is negative for cancer should not delay biopsy if a dominant or   clinically suspicious mass is present.  4  to 8% of cancers are not are not identified by   x-ray.  ?  A negative report may reinforce clinical impression.  ?  Adenosis or fibrocystic disease ( mammary dysplasia )may obscure an underlying neoplasm.  ?  False positive reports average 6 to 10%.  ?  Written notification of the findings and follow-up recommendations were mailed to the patient.  ?  Read By Stephanie Coup- Cassandra Fernandez M.D.

## 2016-06-19 ENCOUNTER — Inpatient Hospital Stay: Admit: 2016-06-19 | Discharge: 2016-06-19

## 2016-06-19 ENCOUNTER — Ambulatory Visit: Attending: Cardiovascular Disease | Primary: Family Medicine

## 2016-06-19 ENCOUNTER — Emergency Department: Admit: 2016-06-19 | Discharge: 2016-06-19

## 2016-06-19 DIAGNOSIS — E039 Hypothyroidism, unspecified: Secondary | ICD-10-CM

## 2016-06-19 DIAGNOSIS — Z8639 Personal history of other endocrine, nutritional and metabolic disease: Secondary | ICD-10-CM

## 2016-06-19 DIAGNOSIS — E079 Disorder of thyroid, unspecified: Principal | ICD-10-CM

## 2016-06-19 DIAGNOSIS — Z91013 Allergy to seafood: Secondary | ICD-10-CM

## 2016-06-19 DIAGNOSIS — R102 Pelvic and perineal pain: Secondary | ICD-10-CM

## 2016-06-19 DIAGNOSIS — N946 Dysmenorrhea, unspecified: Secondary | ICD-10-CM

## 2016-06-19 DIAGNOSIS — M797 Fibromyalgia: Secondary | ICD-10-CM

## 2016-06-19 DIAGNOSIS — Z79899 Other long term (current) drug therapy: Secondary | ICD-10-CM

## 2016-06-19 DIAGNOSIS — S86019A Strain of unspecified Achilles tendon, initial encounter: Secondary | ICD-10-CM

## 2016-06-19 DIAGNOSIS — G43909 Migraine, unspecified, not intractable, without status migrainosus: Secondary | ICD-10-CM

## 2016-06-19 DIAGNOSIS — E785 Hyperlipidemia, unspecified: Secondary | ICD-10-CM

## 2016-06-19 DIAGNOSIS — Z87891 Personal history of nicotine dependence: Secondary | ICD-10-CM

## 2016-06-19 DIAGNOSIS — R0609 Other forms of dyspnea: Secondary | ICD-10-CM

## 2016-06-19 DIAGNOSIS — E669 Obesity, unspecified: Secondary | ICD-10-CM

## 2016-06-19 DIAGNOSIS — R079 Chest pain, unspecified: Principal | ICD-10-CM

## 2016-06-19 DIAGNOSIS — Z7982 Long term (current) use of aspirin: Secondary | ICD-10-CM

## 2016-06-19 DIAGNOSIS — R0602 Shortness of breath: Secondary | ICD-10-CM

## 2016-06-19 DIAGNOSIS — R0789 Other chest pain: Principal | ICD-10-CM

## 2016-06-19 DIAGNOSIS — R61 Generalized hyperhidrosis: Secondary | ICD-10-CM

## 2016-06-19 DIAGNOSIS — Z9189 Other specified personal risk factors, not elsewhere classified: Secondary | ICD-10-CM

## 2016-06-19 DIAGNOSIS — R3914 Feeling of incomplete bladder emptying: Secondary | ICD-10-CM

## 2016-06-19 DIAGNOSIS — R9431 Abnormal electrocardiogram [ECG] [EKG]: Secondary | ICD-10-CM

## 2016-06-19 DIAGNOSIS — Z6835 Body mass index (BMI) 35.0-35.9, adult: Secondary | ICD-10-CM

## 2016-06-19 LAB — CBC AND DIFFERENTIAL
HCT: 37 (ref 36–46)
Hemoglobin: 12.4 (ref 12.0–16.0)
NEUTROS ABS: 4
PLATELETS: 327 (ref 150–399)
WBC: 6.7

## 2016-06-19 LAB — BASIC METABOLIC PANEL
BUN: 11 (ref 4–21)
Creatinine: 0.8 (ref <=1.1)
GLUCOSE: 87
POTASSIUM: 4.1 (ref 3.4–5.3)
SODIUM: 139 (ref 137–147)

## 2016-06-19 LAB — TSH: TSH: 1.05 (ref <=5.90)

## 2016-06-19 MED ORDER — ASPIRIN 81 MG PO TABS
Freq: Every day | ORAL
Start: 2016-06-19 — End: 2016-09-19

## 2016-06-19 MED ORDER — ASPIRIN 325 MG PO TABS
325 mg | Freq: Once | ORAL | Status: CP
Start: 2016-06-19 — End: ?

## 2016-06-19 MED ORDER — BOLUS IV FLUID JX
Freq: Once | INTRAVENOUS | Status: CP
Start: 2016-06-19 — End: ?

## 2016-06-19 NOTE — Progress Notes
she has had a few episodes of left sided chest pain, described as sharp across the left anterior chest and retrosternal pressure, radiated to the neck, unrelated with exertion, occasionally associated with dyspnea, and diaphoresis. Her last episode was actually today in the morning and [facility] where she had negative cardiac enzymes and unchanged ECG and was discharged to follow up in the office. She is asymptomatic now. She also admits dyspnea on exertion, NYHA functional class II, and occasional palpitations, lightheadedness, and dizziness. She denies syncope, claudication, orthopnea, cough, bleeding or other crdiovascular symptoms. Her cardiovascular exam is unremarkable except for a soft systolic murmur, she is not on heart failure and is asymptomatic now. Some of the characteristics of her pain are typical but her pain is unrelted with exertion. However, given her risk factors and abnormal ECG, I will order an exercise nuclear stress test and a transthoracic ecocardiogram to rule out ischemia or structural or valvular disease. She will continue ASA 81 mg daily. Advised on coming back to the ER if her symptoms worsen.      - Ordered an exercise nuclear stress test and transthoracic echocardiogram.  - Continue ASA 81 mg daily.  - RTC in 4 weeks.       2. Dyslipidemia. LDL 140.  - She will like to try diet and exercise before starting statin therapy.       Plan:     No orders of the following type(s) were placed in this encounter: Medications.     Orders Placed This Encounter   Procedures   ? NM Myocard Perfusion Comp Exercise Stres   ? Echo-TTE complete       Health Maintenance was reviewed. The patient's HM Topic list was:                                            Health Maintenance   Topic Date Due   ? DTaP,Tdap,and Td Vaccines (1 - Tdap) 12/19/1982   ? Preventive Wellness Visit  02/08/2017   ? Breast Cancer Screening  03/23/2018   ? Lipid Profile  06/12/2021

## 2016-06-19 NOTE — ED Notes
Bed: ED-24  Expected date: 06/19/16  Expected time:   Means of arrival:   Comments:  Hold first

## 2016-06-19 NOTE — Progress Notes
?   Colon Cancer Screening  01/26/2026   ? USPSTF HIV Risk Assessment  Completed   ? USPSTF Hepatitis C Screening  Completed   ? Influenza Vaccine  Completed       Return in about 4 weeks (around 07/17/2016).

## 2016-06-19 NOTE — Patient Instructions
?   Certain medicines, including birth control pills, diuretics, beta-blockers, and some medicines for depression.  ? Smoking.  ? Eating a high-fat diet.  ? Being overweight.  ? Medical conditions such as diabetes, polycystic ovary syndrome, pregnancy, kidney disease, and hypothyroidism.  ? Lack of regular exercise.  SIGNS AND SYMPTOMS  There are no signs or symptoms with dyslipidemia.  DIAGNOSIS  A simple blood test called a fasting blood test can be done to determine your level of:  ? Total cholesterol. This is the combined number of LDL cholesterol and HDL cholesterol. A healthy number is lower than 200.  ? LDL cholesterol. The goal number for LDL cholesterol is different for each person depending on risk factors. Ask your health care provider what your LDL cholesterol number should be.  ? HDL cholesterol. A healthy level of HDL cholesterol is 60 or higher. A number lower than 40 for men or 50 for women is a danger sign.  ? Triglycerides. A healthy triglyceride number is less than 150.  TREATMENT  Dyslipidemia is a treatable condition. Your health care provider will advise you on what type of treatment is best based on your age, your test results, and current guidelines. Treatment may include:  ? Dietary changes. A dietitian may help you create a diet that is based on your risk factors, conditions, and lifestyle.  ? Regular exercise. This can help lower your LDL cholesterol, raise your HDL cholesterol, and help with weight management. Check with your health care provider before beginning an exercise program. Most people should participate in 30 minutes of brisk exercise 5 days a week.  ? Quitting smoking.  ? Medicines to lower LDL cholesterol and triglycerides.  ? If you have high levels of triglycerides, your health care provider may:    Have you stop drinking alcohol.    Have you restrict your fat intake.    Have you eliminate refined sugars from your diet.

## 2016-06-19 NOTE — Progress Notes
bleeding after second delivery (Created by Conversion)   ? HYSTERECTOMY     ? NASAL SEPTUM SURGERY      Surgery Description: Nasal Septal Deviation Repair;   (Created by Conversion)   ? OTHER SURGICAL HISTORY      Surgery Description: Laparoscopy With Total Hysterectomy;  Problem Comments: excision of endometriosis-12/16/12 (Created by Conversion)   ? SALPINGO-OOPHORECTOMY      Surgery Description: Salpingo-oophorectomy Left Side;  Problem Comments: 12/16/12 (Created by Conversion)   ? TUBAL LIGATION     ? TUBAL LIGATION      Surgery Description: Tubal Ligation;   (Created by Conversion)     Family History   Problem Relation Age of Onset   ? High Blood Pressure Mother    ? High Blood Pressure Father    ? Cancer Maternal Uncle    ? Heart Attack Paternal Uncle    ? Heart Attack Paternal Grandfather    ? Breast Cancer Neg Hx      Social History     Social History   ? Marital status: Married     Spouse name: N/A   ? Number of children: N/A   ? Years of education: N/A     Occupational History   ? Not on file.     Social History Main Topics   ? Smoking status: Former Smoker   ? Smokeless tobacco: Never Used   ? Alcohol use No      Comment: very rare   ? Drug use: No   ? Sexual activity: Yes     Partners: Male     Other Topics Concern   ? Not on file     Social History Narrative     Current Outpatient Prescriptions on File Prior to Visit   Medication Sig   ? cholecalciferol (VITAMIN D-3) 1000 UNITS Tablet Take 1,000 Units by mouth daily.   ? cyanocobalamin (VITAMIN B-12) 50 MCG Tablet Take by mouth daily. Pt does not know dose.   ? estradiol (ESTRACE) 0.1 MG/GM Cream Apply dime sized amount to vagina and urethra twice weekly at bedtime   ? levothyroxine (SYNTHROID, LEVOTHROID) 100 MCG Tablet Take 1 tablet by mouth daily.     No current facility-administered medications on file prior to visit.      Allergies   Allergen Reactions   ? Shellfish Allergy Itching and Swelling   ? Azithromycin Rash

## 2016-06-19 NOTE — ED Notes
IV removed with catheter intact.  Pressure applied, Time of discharge: 1426  PM., Patient discharged to  Home.  Patient discharged  ambulatory. to exit with belongings in  Improved condition.  Patient escorted by  family., Written discharge instructions given to  patient.  Patient/recipient  verbalizes discharge instructions.

## 2016-06-19 NOTE — Progress Notes
Takes benadryl with this medication.   ? Erythromycin Rash     Takes benadryl with this medication.         Review of Systems  Review of Systems   Constitutional: Positive for diaphoresis and fatigue. Negative for chills and fever.   HENT: Negative for nosebleeds, rhinorrhea and sinus pressure.    Respiratory: Positive for chest tightness and shortness of breath. Negative for cough.    Cardiovascular: Positive for chest pain, palpitations and leg swelling.   Gastrointestinal: Negative for abdominal pain, anal bleeding, constipation, diarrhea, nausea and vomiting.   Genitourinary: Negative for dysuria, frequency and hematuria.   Musculoskeletal: Positive for arthralgias and neck pain. Negative for back pain.   Skin: Positive for pallor and rash.   Neurological: Positive for dizziness and light-headedness. Negative for syncope.           Objective:        VITAL SIGNS (all recorded)      Clinic Vitals       06/19/16 1046 06/19/16 1400 06/19/16 1616       Amb Encounter Vitals    Weight 98.5 kg (217 lb 2.5 oz)    -JB at 06/19/16 1047  102.1 kg (225 lb)    -LW at 06/19/16 1618     Height 1.676 m (5\' 6" )    -JB at 06/19/16 1047  1.676 m (5\' 6" )    -LW at 06/19/16 1618     BMI (Calculated) 35.12    -JB at 06/19/16 1047  36.39    -LW at 06/19/16 1618     BSA (Calculated - sq m) 2.14    -JB at 06/19/16 1047  2.18    -LW at 06/19/16 1618     BP 107/68    -JB at 06/19/16 1047 110/67    -JB at 06/19/16 1440 130/82    -LW at 06/19/16 1618     BP Location   Right lower arm    -LW at 06/19/16 1618     Position   Sitting    -LW at 06/19/16 1618     Pulse 72    -JB at 06/19/16 1047 84    -JB at 06/19/16 1440 84    -LW at 06/19/16 1618     Pulse Source   Monitor    -LW at 06/19/16 1618     Pulse Quality   Normal    -LW at 06/19/16 1618     Resp 20    -JB at 06/19/16 1047 20    -JB at 06/19/16 1440      Temp 36.8 ?C (98.3 ?F)    -JB at 06/19/16 1047       O2 Saturation   98 %    -LW at 06/19/16 1618     Pain Score   Five

## 2016-06-19 NOTE — Patient Instructions
Chest Pain, Nonspecific  It is often hard to give a specific diagnosis for the cause of chest pain. There is always a chance that your pain could be related to something serious, like a heart attack or a blood clot in the lungs. You need to follow up with your caregiver for further evaluation. More lab tests or other studies such as X-rays, electrocardiography, stress testing, or cardiac imaging may be needed to find the cause of your pain.  Most of the time, nonspecific chest pain improves within 2 to 3 days with rest and mild pain medicine. For the next few days, avoid physical exertion or activities that bring on pain. Do not smoke. Avoid drinking alcohol. Call your caregiver for routine follow-up as advised.   SEEK IMMEDIATE MEDICAL CARE IF:  ? You develop increased chest pain or pain that radiates to the arm, neck, jaw, back, or abdomen.   ? You develop shortness of breath, increased coughing, or you start coughing up blood.   ? You have severe back or abdominal pain, nausea, or vomiting.   ? You develop severe weakness, fainting, fever, or chills.   Document Released: 08/21/2005 Document Revised: 08/10/2011 Document Reviewed: 02/08/2007  ExitCare? Patient Information ?[address]2012 [employer], [place].    Dyslipidemia  Dyslipidemia is an imbalance of the lipids in your blood. Lipids are waxy, fat-like proteins that your body needs in small amounts. Dyslipidemia often involves the lipids cholesterol or triglycerides. Common forms of dyslipidemia are:  ? High levels of bad cholesterol (LDL cholesterol). LDL cholesterol is the type of cholesterol that causes heart disease.  ? Low levels of good cholesterol (HDL cholesterol). HDL cholesterol is the type of cholesterol that helps protect against heart disease.  ? High levels of triglycerides. Triglycerides are a fatty substance in the blood linked to a buildup of plaque on your arteries.  RISK FACTORS  ? Increased age.  ? Having a family history of high cholesterol.

## 2016-06-19 NOTE — ED Notes
Pt to ed rm 22 w steady gait; pt states cp started 15 mins prior to arriving at ed; states intermittent sob with it, radiating to left side; pt a/ox4, resp even/unlabored; speech clear, skin w/d, moving all extremties

## 2016-06-19 NOTE — Progress Notes
-  LW at 06/19/16 1618     Location   chest    -LW at 06/19/16 1618     Education/Communication Barriers?    Learning/Communication Barriers?   No    -LW at 06/19/16 1618     Fall Risk Assessment    Had recent fall / Last 6 months?   No recent fall    -LW at 06/19/16 1618     Does patient have a fear of falling?   No    -LW at 06/19/16 1618       User Key  (r) = Recorded By, (t) = Taken By, (c) = Cosigned By    Initials Name Effective Dates    Heron SabinsJB Bicksler, Curt BearsJennifer J, RN 12/27/15 -     Alyse LowLW Bill SalinasWillingham, Loni -        Physical Exam   Vitals:    06/19/16 1616   BP: 130/82   Pulse: 84   Weight: 102.1 kg (225 lb)   Height: 1.676 m (5\' 6" )   GEN: Comfortable. No acute distress.   NECK: normal JVP. No bruits.   CV: RRR, normal S1 and S2, no S3. 2/6 SEM best at the LUSB  RESP: clear lung auscultation bilaterally.   ABD: + BS, NT/ND, soft. Obese.   EXT: 2+ pedal pulses bilaterally. No edema.  NEURO: alert, oriented x3. No motor or sensory deficit.        Diagnostic Studies:    ECHO: N/A     CATH: N/A     ECG (06/19/16): SR, HR 69, PR 176, QRS 84, TWI lead III.     LABS (06/19/16): Creatinine 0.76, GFR >60, Hb 12.4, Plt 327, TSH 1.050    LIPID PROFILE (06/19/16): HDL 65, LDL 140, Total Cholesterol 230, Triglycerides 124.         Assessment:       ICD-10-CM ICD-9-CM    1. Atypical chest pain R07.89 786.59 Refer to Cardiology      NM Myocard Perfusion Comp Exercise Stres      Echo-TTE complete   2. Dyspnea on exertion R06.09 786.09 NM Myocard Perfusion Comp Exercise Stres      Echo-TTE complete   3. Hypothyroidism, unspecified type E03.9 244.9    4. Dyslipidemia E78.5 272.4        1. Chest Pain/Abnormal ECG. 50 y/o female with significant past medical history for dyslipidemia, hypothyroidism, fibromyalgia, ex-smoker, and family history of CAD (father had PCI in his 6270's) was referred for initial cardiovascular evaluation due to chest pain. For the past 6 months

## 2016-06-19 NOTE — Patient Instructions
Treat you for other conditions, such as underactive thyroid gland (hypothyroidism) and high blood sugar (hyperglycemia).  Your health care provider will monitor your lipid levels with regular blood tests.  HOME CARE INSTRUCTIONS  ? Eat a healthy diet. Follow any diet instructions if they were given to you by your health care provider.  ? Maintain a healthy weight.  ? Exercise regularly based on the recommendations of your health care provider.  ? Do not use any tobacco products, including cigarettes, chewing tobacco, or electronic cigarettes.  ? Take medicines only as directed by your health care provider.  ? Keep all follow-up visits as directed by your health care provider.  SEEK MEDICAL CARE IF:  You are having possible side effects from your medicines.     This information is not intended to replace advice given to you by your health care provider. Make sure you discuss any questions you have with your health care provider.     Document Released: 08/26/2013 Document Revised: 09/11/2014 Document Reviewed: 08/26/2013  Elsevier Interactive Patient Education ?2017 Elsevier Inc.

## 2016-06-19 NOTE — Progress Notes
Subjective:   Angela Andersen is a 50 y.o. female being seen today for New Patient (chest pain)       HPI   [name] is a 50 y/o female with significant past medical history for dyslipidemia, hypothyroidism, fibromyalgia, ex-smoker, and family history of CAD (father had PCI in his 10870's) was referred for initial cardiovascular evaluation due to chest pain. For the past 6 months she has had a few episodes of left sided chest pain, described as sharp across the left anterior chest and retrosternal pressure, radiated to the neck, unrelated with exertion, occasionally associated with dyspnea, and diaphoresis. Her last episode was actually today in the morning and [facility] where she had negative cardiac enzymes and unchanged ECG and was discharged to follow up in the office. She is asymptomatic now. She also admits dyspnea on exertion, NYHA functional class II, and occasional palpitations, lightheadedness, and dizziness. She denies syncope, claudication, orthopnea, cough, bleeding or other crdiovascular symptoms.       Current Cardiovascular Meds:  ASA 81 mg daily      Past Medical History:   Diagnosis Date   ? Dysmenorrhea    ? Feeling of incomplete bladder emptying    ? Fibromyalgia    ? H/O hyperkalemia    ? History of general anesthesia complication     VERY sensitive to anesthesia   ? Hypothyroid    ? Migraines     hemiplegic migraine   ? Partial tear of Achilles tendon     left   ? Pelvic pain     before hysterectomy   ? Thyroid disease      Past Surgical History:   Procedure Laterality Date   ? COLONOSCOPY     ? COLONOSCOPY W/ OR W/O BIOPSY N/A 01/27/2016    COLONOSCOPY W/ OR W/O BIOPSY performed by Noel GeroldEid, Amalie F, MD at [facility]   ? CYSTOSCOPY      Surgery Description: Cystoscopy (Diagnostic);  Problem Comments: 12/16/12 (Created by Conversion)   ? DILATION AND CURETTAGE OF UTERUS      Surgery Description: Dilation And Curettage;  Problem Comments: for heavy

## 2016-06-20 ENCOUNTER — Encounter: Primary: Family Medicine

## 2016-06-20 DIAGNOSIS — R0789 Other chest pain: Principal | ICD-10-CM

## 2016-06-20 DIAGNOSIS — R0609 Other forms of dyspnea: Secondary | ICD-10-CM

## 2016-06-24 NOTE — ED Provider Notes
History   No chief complaint on file.      HPI Comments: 50 y.o F w/ hx hypothyroid c/o acute onset L sided sharp stabbing CP and retrosternal pressure like CP x 20 minytes. Assocaited w/ some mild sob/diaphoresis. Noa ssocaited n/v/f/c/abd apin/leg pain or swelling. Had a similar episode about 9 days ago. Saw her pcp for this 5 days ago. Has not ttried anything to relive pain. Worsenend by deep breathing. No previous cariac or vte hx. States compliant w/ meds. Denies etoh, tobacco, drug use      Allergies   Allergen Reactions   ? Shellfish Allergy Itching and Swelling   ? Azithromycin Rash     Takes benadryl with this medication.   ? Erythromycin Rash     Takes benadryl with this medication.       Patient's Medications   New Prescriptions    No medications on file   Previous Medications    CHOLECALCIFEROL (VITAMIN D-3) 1000 UNITS TABLET    Take 1,000 Units by mouth daily.    CYANOCOBALAMIN (VITAMIN B-12) 50 MCG TABLET    Take by mouth daily. Pt does not know dose.    ESTRADIOL (ESTRACE) 0.1 MG/GM CREAM    Apply dime sized amount to vagina and urethra twice weekly at bedtime    LEVOTHYROXINE (SYNTHROID, LEVOTHROID) 100 MCG TABLET    Take 1 tablet by mouth daily.   Modified Medications    No medications on file   Discontinued Medications    No medications on file       Past Medical History:   Diagnosis Date   ? Dysmenorrhea    ? Feeling of incomplete bladder emptying    ? Fibromyalgia    ? H/O hyperkalemia    ? History of general anesthesia complication     VERY sensitive to anesthesia   ? Hypothyroid    ? Migraines     hemiplegic migraine   ? Partial tear of Achilles tendon     left   ? Pelvic pain     before hysterectomy   ? Thyroid disease        Past Surgical History:   Procedure Laterality Date   ? COLONOSCOPY     ? COLONOSCOPY W/ OR W/O BIOPSY N/A 01/27/2016    COLONOSCOPY W/ OR W/O BIOPSY performed by Noel GeroldEid, Amalie F, [facility]   ? CYSTOSCOPY

## 2016-06-24 NOTE — ED Provider Notes
and plan as documented in the note. I reviewed the patient's history. I discussed this patient with the resident/fellow.          Rogelia RohrerStorch, Ian David, MD  06/24/16 Jacinta [id number]

## 2016-06-24 NOTE — ED Provider Notes
{  Imaging findings:[phone]}    EKG (Read by ED Provider):  {EKG findings:[phone]}    MDM    ED Course & Re-Evaluation     ED Course     VS wnl. Resting comfortably. Exam unremarkable. Cbc, bmp, trop, tsh, d dimer, ekg, cxr, asa, 1L NS ordered.  Anne ShutterWARREN SHER, MD 10:25 AM 06/19/2016      ED Disposition   ED Disposition: No ED Disposition Set      ED Clinical Impression   ED Clinical Impression:   No Clinical Impression Set      ED Patient Status   Patient Status:   {SH ED JX PATIENT [phone]}        ED Medical Evaluation Initiated   Medical Evaluation Initiated:   Yes, filed at 06/19/16 1025  by Anne ShutterSher, Warren, MD

## 2016-06-24 NOTE — ED Provider Notes
{  Imaging findings:[id number]}    EKG (Read by ED Provider):  {EKG findings:[phone]}    MDM    ED Course & Re-Evaluation     ED Course     VS wnl. Resting comfortably. Exam unremarkable. Cbc, bmp, trop, tsh, d dimer, ekg, cxr, asa, 1L NS ordered.  Anne ShutterWARREN SHER, MD 10:25 AM 06/19/2016    Pt resting comfrotably - sx improvng and almost gone. Exam largely unremakrbale. EKG NSr @ 75, no STE, QTc 413, QRS 90. HEART score 3. D dimer negative. Wil delta trop and repeat ekg.  Anne ShutterWARREN SHER, MD 12:04 PM 06/19/2016    ED Disposition   ED Disposition: No ED Disposition Set      ED Clinical Impression   ED Clinical Impression:   No Clinical Impression Set      ED Patient Status   Patient Status:   {SH ED JX PATIENT [phone]}        ED Medical Evaluation Initiated   Medical Evaluation Initiated:   Yes, filed at 06/19/16 1025  by Anne ShutterSher, Warren, MD

## 2016-06-24 NOTE — ED Provider Notes
estradiol (ESTRACE) 0.1 MG/GM Cream Apply dime sized amount to vagina and urethra twice weekly at bedtimeDisp-1 Tube, R-11, E-Prescribing      levothyroxine (SYNTHROID, LEVOTHROID) 100 MCG Tablet Take 1 tablet by mouth daily.Disp-90 tablet, R-3, DAW, E-PrescribingBrand only synthroid             Past Medical History:   Diagnosis Date   ? Dysmenorrhea    ? Feeling of incomplete bladder emptying    ? Fibromyalgia    ? H/O hyperkalemia    ? History of general anesthesia complication     VERY sensitive to anesthesia   ? Hypothyroid    ? Migraines     hemiplegic migraine   ? Partial tear of Achilles tendon     left   ? Pelvic pain     before hysterectomy   ? Thyroid disease        Past Surgical History:   Procedure Laterality Date   ? COLONOSCOPY     ? COLONOSCOPY W/ OR W/O BIOPSY N/A 01/27/2016    COLONOSCOPY W/ OR W/O BIOPSY performed by Noel GeroldEid, Amalie F, MD at [facility]   ? CYSTOSCOPY      Surgery Description: Cystoscopy (Diagnostic);  Problem Comments: 12/16/12 (Created by Conversion)   ? DILATION AND CURETTAGE OF UTERUS      Surgery Description: Dilation And Curettage;  Problem Comments: for heavy bleeding after second delivery (Created by Conversion)   ? HYSTERECTOMY     ? NASAL SEPTUM SURGERY      Surgery Description: Nasal Septal Deviation Repair;   (Created by Conversion)   ? OTHER SURGICAL HISTORY      Surgery Description: Laparoscopy With Total Hysterectomy;  Problem Comments: excision of endometriosis-12/16/12 (Created by Conversion)   ? SALPINGO-OOPHORECTOMY      Surgery Description: Salpingo-oophorectomy Left Side;  Problem Comments: 12/16/12 (Created by Conversion)   ? TUBAL LIGATION     ? TUBAL LIGATION      Surgery Description: Tubal Ligation;   (Created by Conversion)       Family History   Problem Relation Age of Onset   ? High Blood Pressure Mother    ? High Blood Pressure Father    ? Cancer Maternal Uncle    ? Heart Attack Paternal Uncle    ? Heart Attack Paternal Grandfather

## 2016-06-24 NOTE — ED Provider Notes
History     Chief Complaint   Patient presents with   ? Chest Pain       HPI Comments: [age] y.o F w/ hx hypothyroid c/o acute onset L sided sharp stabbing CP and retrosternal pressure like CP x 20 minytes. Assocaited w/ some mild sob/diaphoresis. Noa ssocaited n/v/f/c/abd apin/leg pain or swelling. Had a similar episode about 9 days ago. Saw her pcp for this 5 days ago. Has not ttried anything to relive pain. Worsenend by deep breathing. No previous cariac or vte hx. States compliant w/ meds. Denies etoh, tobacco, drug use    Patient is a 50 y.o. female presenting with chest pain. The history is provided by the patient.   Chest Pain   Pain location:  L chest and substernal area  Pain quality: pressure (retrosternal) and sharp (Left side)    Pain radiates to:  Does not radiate  Pain severity:  Moderate  Onset quality:  Sudden  Duration:  20 minutes  Timing:  Constant  Progression:  Unchanged  Chronicity:  Recurrent  Context: at rest    Relieved by:  None tried  Worsened by:  Nothing  Ineffective treatments:  None tried  Associated symptoms: diaphoresis and shortness of breath    Associated symptoms: no abdominal pain, no claudication, no nausea, no numbness, no palpitations and no vomiting    Risk factors: obesity    Risk factors: no diabetes mellitus, no high cholesterol, no prior DVT/PE and no smoking        Allergies   Allergen Reactions   ? Shellfish Allergy Itching and Swelling   ? Azithromycin Rash     Takes benadryl with this medication.   ? Erythromycin Rash     Takes benadryl with this medication.       Discharge Medication List as of 06/19/2016  2:26 PM      CONTINUE these medications which have NOT CHANGED    Details   aspirin 81 MG Tablet Take by mouth daily.Historical Med      cholecalciferol (VITAMIN D-3) 1000 UNITS Tablet Take 1,000 Units by mouth daily.Historical Med      cyanocobalamin (VITAMIN B-12) 50 MCG Tablet Take by mouth daily. Pt does not know dose.Historical Med

## 2016-06-24 NOTE — ED Provider Notes
History   No chief complaint on file.      HPI    Allergies   Allergen Reactions   ? Shellfish Allergy Itching and Swelling   ? Azithromycin Rash     Takes benadryl with this medication.   ? Erythromycin Rash     Takes benadryl with this medication.       Patient's Medications   New Prescriptions    No medications on file   Previous Medications    CHOLECALCIFEROL (VITAMIN D-3) 1000 UNITS TABLET    Take 1,000 Units by mouth daily.    CYANOCOBALAMIN (VITAMIN B-12) 50 MCG TABLET    Take by mouth daily. Pt does not know dose.    ESTRADIOL (ESTRACE) 0.1 MG/GM CREAM    Apply dime sized amount to vagina and urethra twice weekly at bedtime    LEVOTHYROXINE (SYNTHROID, LEVOTHROID) 100 MCG TABLET    Take 1 tablet by mouth daily.   Modified Medications    No medications on file   Discontinued Medications    No medications on file       Past Medical History:   Diagnosis Date   ? Dysmenorrhea    ? Feeling of incomplete bladder emptying    ? Fibromyalgia    ? H/O hyperkalemia    ? History of general anesthesia complication     VERY sensitive to anesthesia   ? Hypothyroid    ? Migraines     hemiplegic migraine   ? Partial tear of Achilles tendon     left   ? Pelvic pain     before hysterectomy   ? Thyroid disease        Past Surgical History:   Procedure Laterality Date   ? COLONOSCOPY     ? COLONOSCOPY W/ OR W/O BIOPSY N/A 01/27/2016    COLONOSCOPY W/ OR W/O BIOPSY performed by Noel GeroldEid, Amalie F, [facility]   ? CYSTOSCOPY      Surgery Description: Cystoscopy (Diagnostic);  Problem Comments: 12/16/12 (Created by Conversion)   ? DILATION AND CURETTAGE OF UTERUS      Surgery Description: Dilation And Curettage;  Problem Comments: for heavy bleeding after second delivery (Created by Conversion)   ? HYSTERECTOMY     ? NASAL SEPTUM SURGERY      Surgery Description: Nasal Septal Deviation Repair;   (Created by Conversion)   ? OTHER SURGICAL HISTORY      Surgery Description: Laparoscopy With Total Hysterectomy;  Problem

## 2016-06-24 NOTE — ED Provider Notes
?   Breast Cancer Neg Hx        Social History     Social History   ? Marital status: Married     Spouse name: N/A   ? Number of children: N/A   ? Years of education: N/A     Social History Main Topics   ? Smoking status: Former Smoker   ? Smokeless tobacco: Never Used   ? Alcohol use No      Comment: very rare   ? Drug use: No   ? Sexual activity: Yes     Partners: Male     Other Topics Concern   ? None     Social History Narrative       Review of Systems   Constitutional: Positive for diaphoresis.   Respiratory: Positive for shortness of breath.    Cardiovascular: Positive for chest pain. Negative for palpitations, claudication and leg swelling.   Gastrointestinal: Negative for nausea, vomiting and abdominal pain.   Genitourinary: Negative for dysuria.   Neurological: Negative for numbness.   All other systems reviewed and are negative.      Physical Exam       ED Triage Vitals   BP 06/19/16 1046 107/68   Pulse 06/19/16 1046 72   Resp 06/19/16 1046 20   Temp 06/19/16 1046 36.8 ?C (98.3 ?F)   Temp src 06/19/16 1046 Oral   Height 06/19/16 1046 1.676 m   Weight 06/19/16 1046 98.5 kg   SpO2 06/19/16 1046 100 %   BMI (Calculated) 06/19/16 1046 35.12             Physical Exam   Constitutional: She is oriented to person, place, and time. She appears well-developed and well-nourished. No distress.   HENT:   Head: Normocephalic and atraumatic.   Mouth/Throat: Oropharynx is clear and moist.   Eyes: Conjunctivae and EOM are normal. Pupils are equal, round, and reactive to light.   Neck: Normal range of motion. Neck supple.   Cardiovascular: Normal rate, regular rhythm, normal heart sounds and intact distal pulses.  Exam reveals no gallop and no friction rub.    No murmur heard.  Pulses:       Radial pulses are 2+ on the right side, and 2+ on the left side.        Dorsalis pedis pulses are 2+ on the right side, and 2+ on the left side.        Posterior tibial pulses are 2+ on the right side, and 2+ on the left side.

## 2016-06-24 NOTE — ED Provider Notes
{  Imaging findings:[phone]}    EKG (Read by ED Provider):  {EKG findings:[phone]}    MDM    ED Course & Re-Evaluation     ED Course     VS wnl. Resting comfortably. Exam unremarkable. Cbc, bmp, trop, tsh, d dimer, ekg, cxr, asa, 1L NS ordered.  Anne ShutterWARREN SHER, MD 10:25 AM 06/19/2016    Pt resting comfrotably - sx improvng and almost gone. Exam largely unremakrbale. EKG NSr @ 75, no STE, QTc 413, QRS 90. HEART score 3. D dimer negative. Wil delta trop and repeat ekg.  Anne ShutterWARREN SHER, MD 12:04 PM 06/19/2016    Delta trop neagtive. Pt sees cardiology today in 2 hrs.  Anne ShutterWARREN SHER, MD 2:24 PM 06/19/2016    ED Disposition   ED Disposition: No ED Disposition Set      ED Clinical Impression   ED Clinical Impression:   No Clinical Impression Set      ED Patient Status   Patient Status:   {SH ED JX PATIENT [phone]}        ED Medical Evaluation Initiated   Medical Evaluation Initiated:   Yes, filed at 06/19/16 1025  by Anne ShutterSher, Warren, MD

## 2016-06-24 NOTE — ED Provider Notes
Comment: A cut-off for the exclusion of DVT and PE has not been established for this method   TROPONIN T (ED -ONLY) - Normal    Troponin T <0.01  0.00 - 0.04 ng/ml   BASIC METABOLIC PANEL    Sodium 867  135 - 145 mmol/L    Potassium 4.1  3.3 - 4.6 mmol/L    Chloride 101  101 - 110 mmol/L    CO2 28  21 - 29 mmol/L    Urea Nitrogen 11  6 - 22 mg/dL    Creatinine 0.76  0.51 - 0.95 mg/dL    BUN/Creatinine Ratio 14.5  6.0 - 22.0 (calc)    Glucose 87  71 - 99 mg/dL    Calcium 9.8  8.6 - 10.0 mg/dL    Osmolality Calc 276.3      Anion Gap 10  4 - 16 mmol/L    EGFR >59  mL/min/1.73M2    Comment:   Reference range: =>90 ml/min/1.73M2  eGFR estimates are unable to accurately differentiate levels of GFR above 60 ml/min/1.73M2.   POCT GLUCOSE   CBC AND DIFFERENTIAL         Imaging (Read by ED Provider):  Per Radiology: Xr Chest Single View    Result Date: 06/19/2016  PORTABLE CHEST X-RAY (SINGLE FRONTAL VIEW) History:50 years Female Chest pain Comparison: None. Findings:  The trachea is midline. The lungs are grossly clear. The costophrenic angles are sharp. No pleural effusion or pneumothorax is identified.  The cardiac silhouette, mediastinum,  and pulmonary vessels are within normal limits. There is no acute osseous abnormality.     Impression: No acute cardiopulmonary disease. I personally reviewed the images and the residents findings and agree with the above. Read By Ricci Barker M.D.  Electronically Verified By - Ricci Barker M.D.  Released Date Time - 06/19/2016 11:16 AM  Resident - Valorie Roosevelt       EKG (Read by ED Provider):  NSr @ 75, no STE, QTc 413, QRS 9    MDM  Number of Diagnoses or Management Options  Chest pain, unspecified type:      Amount and/or Complexity of Data Reviewed  Clinical lab tests: ordered and reviewed  Tests in the radiology section of CPT?: ordered and reviewed  Tests in the medicine section of CPT?: ordered and reviewed  Discussion of test results with the performing providers: no

## 2016-06-24 NOTE — ED Provider Notes
Yes, filed at 06/19/16 1025  by Anne ShutterSher, Warren, MD

## 2016-06-24 NOTE — ED Provider Notes
Pulmonary/Chest: Effort normal and breath sounds normal. No respiratory distress. She has no wheezes. She has no rales. She exhibits no tenderness.   Abdominal: Soft. Bowel sounds are normal. She exhibits no distension and no mass. There is no tenderness. There is no rebound and no guarding.   Musculoskeletal: Normal range of motion.   Neurological: She is alert and oriented to person, place, and time.   Skin: Skin is warm and dry. She is not diaphoretic.   Psychiatric: She has a normal mood and affect. Her behavior is normal. Judgment and thought content normal.   Nursing note and vitals reviewed.      Differential DDx: acs, vte, pna, ptx, dissection, other    Is this an Emergent Medical Condition? Yes - Severe Pain/Acute Onset of Symptons  409.901 FS  641.19 FS  627.732 (16) FS    ED Workup   Procedures    Labs:  -   CBC AUTODIFF - Abnormal        Result Value Ref Range    Monocytes % 6.9 (*) 2.0 - 6.0 %    Eosinophils % 0.7 (*) 1.0 - 4.0 %    WBC 6.67  4.5 - 11 x10E3/uL    RBC 4.31  4.20 - 5.40 x10E6/uL    Hemoglobin 12.4  12.0 - 16.0 g/dL    Hematocrit 13.037.1  86.537.0 - 47.0 %    MCV 86.1  82.0 - 101.0 fl    MCH 28.8  27.0 - 34.0 pg    MCHC 33.4  31.0 - 36.0 g/dL    RDW 78.413.1  69.612.0 - 29.516.1 %    Platelet Count 327  140 - 440 thou/cu mm    MPV 10.0  9.5 - 11.5 fl    nRBC % 0.0  0.0 - 1.0 %    Absolute NRBC Count 0.00      Neutrophils % 61.3  34.0 - 73.0 %    Lymphocytes % 30.1  25.0 - 45.0 %    Immature Granulocytes % 0.3  0.0 - 2.0 %    Neutrophils Absolute 4.08  1.80 - 8.70 x10E3/uL    Lymphocytes Absolute 2.01  x10E3/uL    Monocytes Absolute 0.46  x10E3/uL    Eosinophils Absolute 0.05  x10E3/uL    Basophil Absolute 0.05  x10E3/uL    Basophils % 0.7  0 - 1 %   TROPONIN T (ED -ONLY) - Normal    Troponin T <0.01  0.00 - 0.04 ng/ml   TSH - Normal    TSH 1.050  0.270 - 4.200 mIU/L   D-DIMER,QUANTITATIVE - Normal    D Dimer (hs) <0.27  0.00 - 0.49 ug/mL (FEU)

## 2016-06-24 NOTE — ED Provider Notes
Surgery Description: Cystoscopy (Diagnostic);  Problem Comments: 12/16/12 (Created by Conversion)   ? DILATION AND CURETTAGE OF UTERUS      Surgery Description: Dilation And Curettage;  Problem Comments: for heavy bleeding after second delivery (Created by Conversion)   ? HYSTERECTOMY     ? NASAL SEPTUM SURGERY      Surgery Description: Nasal Septal Deviation Repair;   (Created by Conversion)   ? OTHER SURGICAL HISTORY      Surgery Description: Laparoscopy With Total Hysterectomy;  Problem Comments: excision of endometriosis-12/16/12 (Created by Conversion)   ? SALPINGO-OOPHORECTOMY      Surgery Description: Salpingo-oophorectomy Left Side;  Problem Comments: 12/16/12 (Created by Conversion)   ? TUBAL LIGATION     ? TUBAL LIGATION      Surgery Description: Tubal Ligation;   (Created by Conversion)       Family History   Problem Relation Age of Onset   ? High Blood Pressure Mother    ? High Blood Pressure Father    ? Cancer Maternal Uncle    ? Heart Attack Paternal Uncle    ? Heart Attack Paternal Grandfather    ? Breast Cancer Neg Hx        Social History     Social History   ? Marital status: Married     Spouse name: N/A   ? Number of children: N/A   ? Years of education: N/A     Social History Main Topics   ? Smoking status: Former Smoker   ? Smokeless tobacco: Never Used   ? Alcohol use No      Comment: very rare   ? Drug use: No   ? Sexual activity: Yes     Partners: Male     Other Topics Concern   ? Not on file     Social History Narrative       Review of Systems    Physical Exam     ED Triage Vitals   BP --    Pulse --    Resp --    Temp --    Temp src --    Height --    Weight --    SpO2 --    BMI (Calculated) --              Physical Exam    Differential DDx: ***    Is this an Emergent Medical Condition? {SH ED EMERGENT MEDICAL CONDITION:[id number]}  409.901 FS  641.19 FS  627.732 (16) FS    ED Workup   Procedures    Labs:  - - No data to display      Imaging (Read by ED Provider):

## 2016-06-24 NOTE — ED Provider Notes
Decide to obtain previous medical records or to obtain history from someone other than the patient: no  Obtain history from someone other than the patient: no  Review and summarize past medical records: no  Discuss the patient with other providers: no  Independent visualization of images, tracings, or specimens: yes        ED Course & Re-Evaluation     ED Course     VS wnl. Resting comfortably. Exam unremarkable. Cbc, bmp, trop, tsh, d dimer (wells 0, cannot perc due to age), ekg, cxr, asa, 1L NS ordered.  Anne ShutterWARREN SHER, MD 10:25 AM 06/19/2016    Pt resting comfrotably - sx improvng and almost gone. Exam largely unremakrbale. EKG NSr @ 75, no STE, QTc 413, QRS 90. HEART score 3. D dimer negative. Wil delta trop and repeat ekg.  Anne ShutterWARREN SHER, MD 12:04 PM 06/19/2016    Delta trop neagtive. Pt sees cardiology today in 2 hrs. Reassured and gien return precautions. Pt voices understanding and feels much better. The pt/caregiver understands that, at this time, there is no indication for further inpatient workup or admission, but that the patient may require further diagnostics/intervention for worsening, changing, or persistent symptoms at a later time. Any of the above should prompt immediate follow-up with the PCP or return to the ED if the patient is unable to be seen by the PCP. The importance of follow-up and return precautions were discussed. The pt/caregiver verbalized understanding and their questions were answered. Discharge instructions and information were provided.  Anne ShutterWARREN SHER, MD 2:24 PM 06/19/2016    ED Disposition   ED Disposition: Discharge      ED Clinical Impression   ED Clinical Impression:   Chest pain, unspecified type  Chest pain      ED Patient Status   Patient Status:   Good        ED Medical Evaluation Initiated   Medical Evaluation Initiated:   Yes, filed at 06/19/16 1025  by Anne ShutterSher, Warren, MD          I saw and evaluated the patient. I reviewed and agree with the findings

## 2016-06-24 NOTE — ED Provider Notes
Comments: excision of endometriosis-12/16/12 (Created by Conversion)   ? SALPINGO-OOPHORECTOMY      Surgery Description: Salpingo-oophorectomy Left Side;  Problem Comments: 12/16/12 (Created by Conversion)   ? TUBAL LIGATION     ? TUBAL LIGATION      Surgery Description: Tubal Ligation;   (Created by Conversion)       Family History   Problem Relation Age of Onset   ? High Blood Pressure Mother    ? High Blood Pressure Father    ? Cancer Maternal Uncle    ? Heart Attack Paternal Uncle    ? Heart Attack Paternal Grandfather    ? Breast Cancer Neg Hx        Social History     Social History   ? Marital status: Married     Spouse name: N/A   ? Number of children: N/A   ? Years of education: N/A     Social History Main Topics   ? Smoking status: Former Smoker   ? Smokeless tobacco: Never Used   ? Alcohol use No      Comment: very rare   ? Drug use: No   ? Sexual activity: Yes     Partners: Male     Other Topics Concern   ? Not on file     Social History Narrative       Review of Systems    Physical Exam     ED Triage Vitals   BP --    Pulse --    Resp --    Temp --    Temp src --    Height --    Weight --    SpO2 --    BMI (Calculated) --              Physical Exam    Differential DDx: ***    Is this an Emergent Medical Condition? {SH ED EMERGENT MEDICAL CONDITION:[id number]}  409.901 FS  641.19 FS  627.732 (16) FS    ED Workup   Procedures    Labs:  - - No data to display      Imaging (Read by ED Provider):  {Imaging findings:[phone]}    EKG (Read by ED Provider):  {EKG findings:[phone]}    MDM    ED Course & Re-Evaluation     ED Course     Anne ShutterWARREN SHER, MD 10:25 AM 06/19/2016      ED Disposition   ED Disposition: No ED Disposition Set      ED Clinical Impression   ED Clinical Impression:   No Clinical Impression Set      ED Patient Status   Patient Status:   {SH ED JX PATIENT STATUS:[phone]}        ED Medical Evaluation Initiated   Medical Evaluation Initiated:

## 2016-06-24 NOTE — ED Provider Notes
Decide to obtain previous medical records or to obtain history from someone other than the patient: no  Obtain history from someone other than the patient: no  Review and summarize past medical records: no  Discuss the patient with other providers: no  Independent visualization of images, tracings, or specimens: yes        ED Course & Re-Evaluation     ED Course     VS wnl. Resting comfortably. Exam unremarkable. Cbc, bmp, trop, tsh, d dimer (wells 0, cannot perc due to age), ekg, cxr, asa, 1L NS ordered.  Anne ShutterWARREN SHER, MD 10:25 AM 06/19/2016    Pt resting comfrotably - sx improvng and almost gone. Exam largely unremakrbale. EKG NSr @ 75, no STE, QTc 413, QRS 90. HEART score 3. D dimer negative. Wil delta trop and repeat ekg.  Anne ShutterWARREN SHER, MD 12:04 PM 06/19/2016    Delta trop neagtive. Pt sees cardiology today in 2 hrs. Reassured and gien return precautions. Pt voices understanding and feels much better. The pt/caregiver understands that, at this time, there is no indication for further inpatient workup or admission, but that the patient may require further diagnostics/intervention for worsening, changing, or persistent symptoms at a later time. Any of the above should prompt immediate follow-up with the PCP or return to the ED if the patient is unable to be seen by the PCP. The importance of follow-up and return precautions were discussed. The pt/caregiver verbalized understanding and their questions were answered. Discharge instructions and information were provided.  Anne ShutterWARREN SHER, MD 2:24 PM 06/19/2016    ED Disposition   ED Disposition: Discharge      ED Clinical Impression   ED Clinical Impression:   Chest pain, unspecified type  Chest pain      ED Patient Status   Patient Status:   Good        ED Medical Evaluation Initiated   Medical Evaluation Initiated:   Yes, filed at 06/19/16 1025  by Anne ShutterSher, Warren, MD

## 2016-06-29 ENCOUNTER — Inpatient Hospital Stay: Admit: 2016-06-29 | Discharge: 2016-06-30 | Primary: Family Medicine

## 2016-06-29 DIAGNOSIS — R0789 Other chest pain: Principal | ICD-10-CM

## 2016-06-29 DIAGNOSIS — R9431 Abnormal electrocardiogram [ECG] [EKG]: Secondary | ICD-10-CM

## 2016-06-29 DIAGNOSIS — R079 Chest pain, unspecified: Secondary | ICD-10-CM

## 2016-06-29 DIAGNOSIS — E669 Obesity, unspecified: Secondary | ICD-10-CM

## 2016-06-29 DIAGNOSIS — R0609 Other forms of dyspnea: Secondary | ICD-10-CM

## 2016-06-29 DIAGNOSIS — R0600 Dyspnea, unspecified: Secondary | ICD-10-CM

## 2016-06-29 DIAGNOSIS — E785 Hyperlipidemia, unspecified: Secondary | ICD-10-CM

## 2016-06-29 MED ORDER — TECHNETIUM TC 99M SESTAMIBI (CARDIOLITE)
9-11 | Freq: Once | INTRAVENOUS | Status: CP
Start: 2016-06-29 — End: ?

## 2016-06-29 MED ORDER — TECHNETIUM TC 99M SESTAMIBI (CARDIOLITE)
27-33 | Freq: Once | INTRAVENOUS | Status: CP
Start: 2016-06-29 — End: ?

## 2016-06-29 NOTE — Progress Notes
Nuclear treadmill test completed.  Pt had no complaints of chest pain before, during or after exercise.  Discharge instructions given to patient and she verbalized understanding.  Pt's IV was removed by stress RN with no problems.  Pt is to remain in department for stress images.

## 2016-07-04 ENCOUNTER — Encounter: Primary: Family Medicine

## 2016-07-10 ENCOUNTER — Encounter: Attending: Cardiovascular Disease | Primary: Family Medicine

## 2016-07-24 ENCOUNTER — Ambulatory Visit: Attending: Cardiovascular Disease | Primary: Family Medicine

## 2016-07-24 DIAGNOSIS — R3914 Feeling of incomplete bladder emptying: Secondary | ICD-10-CM

## 2016-07-24 DIAGNOSIS — G43909 Migraine, unspecified, not intractable, without status migrainosus: Secondary | ICD-10-CM

## 2016-07-24 DIAGNOSIS — S86019A Strain of unspecified Achilles tendon, initial encounter: Secondary | ICD-10-CM

## 2016-07-24 DIAGNOSIS — E785 Hyperlipidemia, unspecified: Secondary | ICD-10-CM

## 2016-07-24 DIAGNOSIS — E079 Disorder of thyroid, unspecified: Principal | ICD-10-CM

## 2016-07-24 DIAGNOSIS — E6609 Other obesity due to excess calories: Secondary | ICD-10-CM

## 2016-07-24 DIAGNOSIS — N946 Dysmenorrhea, unspecified: Secondary | ICD-10-CM

## 2016-07-24 DIAGNOSIS — Z8639 Personal history of other endocrine, nutritional and metabolic disease: Secondary | ICD-10-CM

## 2016-07-24 DIAGNOSIS — M797 Fibromyalgia: Secondary | ICD-10-CM

## 2016-07-24 DIAGNOSIS — Z9189 Other specified personal risk factors, not elsewhere classified: Secondary | ICD-10-CM

## 2016-07-24 DIAGNOSIS — R0789 Other chest pain: Principal | ICD-10-CM

## 2016-07-24 DIAGNOSIS — R102 Pelvic and perineal pain: Secondary | ICD-10-CM

## 2016-07-24 DIAGNOSIS — E039 Hypothyroidism, unspecified: Secondary | ICD-10-CM

## 2016-07-24 DIAGNOSIS — R0609 Other forms of dyspnea: Secondary | ICD-10-CM

## 2016-07-24 NOTE — Progress Notes
COLONOSCOPY W/ OR W/O BIOPSY performed by Noel GeroldEid, Amalie F, [facility]   ? CYSTOSCOPY      Surgery Description: Cystoscopy (Diagnostic);  Problem Comments: 12/16/12 (Created by Conversion)   ? DILATION AND CURETTAGE OF UTERUS      Surgery Description: Dilation And Curettage;  Problem Comments: for heavy bleeding after second delivery (Created by Conversion)   ? HYSTERECTOMY     ? NASAL SEPTUM SURGERY      Surgery Description: Nasal Septal Deviation Repair;   (Created by Conversion)   ? OTHER SURGICAL HISTORY      Surgery Description: Laparoscopy With Total Hysterectomy;  Problem Comments: excision of endometriosis-12/16/12 (Created by Conversion)   ? SALPINGO-OOPHORECTOMY      Surgery Description: Salpingo-oophorectomy Left Side;  Problem Comments: 12/16/12 (Created by Conversion)   ? TUBAL LIGATION     ? TUBAL LIGATION      Surgery Description: Tubal Ligation;   (Created by Conversion)     Family History   Problem Relation Age of Onset   ? High Blood Pressure Mother    ? High Blood Pressure Father    ? Cancer Maternal Uncle    ? Heart Attack Paternal Uncle    ? Heart Attack Paternal Grandfather    ? Breast Cancer Neg Hx      Social History     Social History   ? Marital status: Married     Spouse name: N/A   ? Number of children: N/A   ? Years of education: N/A     Occupational History   ? Not on file.     Social History Main Topics   ? Smoking status: Former Smoker   ? Smokeless tobacco: Never Used   ? Alcohol use No      Comment: very rare   ? Drug use: No   ? Sexual activity: Yes     Partners: Male     Other Topics Concern   ? Not on file     Social History Narrative     Current Outpatient Prescriptions on File Prior to Visit   Medication Sig   ? aspirin 81 MG Tablet Take by mouth daily.   ? cholecalciferol (VITAMIN D-3) 1000 UNITS Tablet Take 1,000 Units by mouth daily.   ? cyanocobalamin (VITAMIN B-12) 50 MCG Tablet Take by mouth daily. Pt does not know dose.

## 2016-07-24 NOTE — Patient Instructions
?   Dietary changes. A dietitian may help you create a diet that is based on your risk factors, conditions, and lifestyle.  ? Regular exercise. This can help lower your LDL cholesterol, raise your HDL cholesterol, and help with weight management. Check with your health care provider before beginning an exercise program. Most people should participate in 30 minutes of brisk exercise 5 days a week.  ? Quitting smoking.  ? Medicines to lower LDL cholesterol and triglycerides.  ? If you have high levels of triglycerides, your health care provider may:    Have you stop drinking alcohol.    Have you restrict your fat intake.    Have you eliminate refined sugars from your diet.    Treat you for other conditions, such as underactive thyroid gland (hypothyroidism) and high blood sugar (hyperglycemia).  Your health care provider will monitor your lipid levels with regular blood tests.  HOME CARE INSTRUCTIONS  ? Eat a healthy diet. Follow any diet instructions if they were given to you by your health care provider.  ? Maintain a healthy weight.  ? Exercise regularly based on the recommendations of your health care provider.  ? Do not use any tobacco products, including cigarettes, chewing tobacco, or electronic cigarettes.  ? Take medicines only as directed by your health care provider.  ? Keep all follow-up visits as directed by your health care provider.  SEEK MEDICAL CARE IF:  You are having possible side effects from your medicines.     This information is not intended to replace advice given to you by your health care provider. Make sure you discuss any questions you have with your health care provider.     Document Released: 08/26/2013 Document Revised: 09/11/2014 Document Reviewed: 08/26/2013  Elsevier Interactive Patient Education ?2017 [employer]

## 2016-07-24 NOTE — Progress Notes
?  ?    2. Dyslipidemia, LDL 140.  - She will like to try diet and exercise before starting statin therapy.  - Recheck lipid panel in 3-6 months before next visit.       Plan:     No orders of the following type(s) were placed in this encounter: Medications.     No orders of the following type(s) were placed in this encounter: Procedures      Health Maintenance was reviewed. The patient's HM Topic list was:                                            Health Maintenance   Topic Date Due   ? DTaP,Tdap,and Td Vaccines (1 - Tdap) 12/19/1982   ? Pneumovax / Prevnar (1 of 1 - PPSV23) 12/19/1982   ? Preventive Wellness Visit  02/08/2017   ? Breast Cancer Screening  03/23/2018   ? Lipid Profile  06/12/2021   ? Colon Cancer Screening  01/26/2026   ? USPSTF HIV Risk Assessment  Completed   ? USPSTF Hepatitis C Screening  Completed   ? Influenza Vaccine  Completed

## 2016-07-24 NOTE — Progress Notes
Subjective:   Angela Andersen is a 50 y.o. female being seen today for Follow-up       HPI  Angela Andersen is an 50 year old white female with significant past medical history for dyslipidemia, hypothyroidism, fibromyalgia, ex-smoker, with family history of coronary artery disease, is coming for a follow-up evaluation and to review the results of her noninvasive testing today. During last visit she complained of a few months of episodes of left-sided chest pain associated with dizziness and exertion for which she underwent transthoracic echocardiogram and nuclear stress testing, which showed normal left ventricular systolic function, no significant valvular disease, and no ischemia. Today she actually says her chest pain episodes have decreased, since her last visit she only had one episode 2 weeks ago which is described as sharp and pressure retrosternal chest pain associated with epigastric pain which actually woke her up from sleep and lasted a few hours. She admits eating more spicy foods lately. She hasn't had episodes of chest pain on exertion since her last visit. She still complaining of dyspnea on exertion, NYHA functional class II, which she only notices when she goes up a couple of flights of stairs. She denies orthopnea, PND, lightheadedness, syncope, dizziness, cough, claudication, lower extremity edema, or any other cardiovascular symptoms.     ?  Current Cardiovascular Meds:  ASA 81 mg daily  ?    Past Medical History:   Diagnosis Date   ? Dysmenorrhea    ? Feeling of incomplete bladder emptying    ? Fibromyalgia    ? H/O hyperkalemia    ? History of general anesthesia complication     VERY sensitive to anesthesia   ? Hypothyroid    ? Migraines     hemiplegic migraine   ? Partial tear of Achilles tendon     left   ? Pelvic pain     before hysterectomy   ? Thyroid disease      Past Surgical History:   Procedure Laterality Date   ? COLONOSCOPY     ? COLONOSCOPY W/ OR W/O BIOPSY N/A 01/27/2016

## 2016-07-24 NOTE — Progress Notes
Fall Risk Assessment    Had recent fall / Last 6 months? No recent fall    -RW at 07/24/16 [id number]       Does patient have a fear of falling? No    -RW at 07/24/16 [id number]         User Key  (r) = Recorded By, (t) = Taken By, (c) = Cosigned By    Initials Name Effective Dates    Angela JarvisW Waters, [place], [place] 01/21/16 -         Physical Exam   Vitals:    07/24/16 0917   BP: 125/86   Pulse: 87   Weight: 102.1 kg (225 lb)   Height: 1.676 m (5\' 6" )     GEN: Comfortable. No acute distress.   EYES: normal conjuntivea, anicteric sclerae.   HEENT: moist oral mocusa, normal oropharynx.   NECK: normal JVP. No bruits.   CV: RRR, normal S1 and S2, no S3. 1/6 SEM best at the RUSB.   RESP: clear lung auscultation bilaterally.   ABD: + BS, NT/ND, soft. Obese.   EXT: 2+ pedal pulses bilaterally. No edema.   NEURO: alert, oriented x3. No motor or sensory deficit.      ?  Diagnostic Studies:  ?  ECHO - TTE (06/10/16) : UF  1. This was a technically difficult study with suboptimal views.  2. Overall left ventricular systolic function is normal with, an EF between 60 - 65 %.  3. Normal diastolic function and filling pressure.    NUCLEAR STRESS TEST (06/29/16): UF  1) Normal myocardial perfusion scan. No inducible ischemia and no scar.  2) The stress ECG is reported separately.  3) Left Ventricular systolic function is normal overall with a ?LVEF of 72 % at rest and 73% post stress with normal wall motion.  4) Thyroid uptake homegenous  ?  CATH: N/A  ?  ECG (06/19/16): SR, HR 69, PR 176, QRS 84, TWI lead III.   ?  LABS (06/19/16): Creatinine 0.76, GFR >60, Hb 12.4, Plt 327, TSH 1.050  ?  LIPID PROFILE (06/19/16): HDL 65, LDL 140, Total Cholesterol 230, Triglycerides 124.   ?    Assessment:       ICD-10-CM ICD-9-CM    1. Atypical chest pain R07.89 786.59    2. Dyslipidemia E78.5 272.4    3. Dyspnea on exertion R06.09 786.09    4. Class 1 obesity due to excess calories in adult, unspecified BMI,

## 2016-07-24 NOTE — Patient Instructions
Dyslipidemia  Dyslipidemia is an imbalance of the lipids in your blood. Lipids are waxy, fat-like proteins that your body needs in small amounts. Dyslipidemia often involves the lipids cholesterol or triglycerides. Common forms of dyslipidemia are:  ? High levels of bad cholesterol (LDL cholesterol). LDL cholesterol is the type of cholesterol that causes heart disease.  ? Low levels of good cholesterol (HDL cholesterol). HDL cholesterol is the type of cholesterol that helps protect against heart disease.  ? High levels of triglycerides. Triglycerides are a fatty substance in the blood linked to a buildup of plaque on your arteries.  RISK FACTORS  ? Increased age.  ? Having a family history of high cholesterol.  ? Certain medicines, including birth control pills, diuretics, beta-blockers, and some medicines for depression.  ? Smoking.  ? Eating a high-fat diet.  ? Being overweight.  ? Medical conditions such as diabetes, polycystic ovary syndrome, pregnancy, kidney disease, and hypothyroidism.  ? Lack of regular exercise.  SIGNS AND SYMPTOMS  There are no signs or symptoms with dyslipidemia.  DIAGNOSIS  A simple blood test called a fasting blood test can be done to determine your level of:  ? Total cholesterol. This is the combined number of LDL cholesterol and HDL cholesterol. A healthy number is lower than 200.  ? LDL cholesterol. The goal number for LDL cholesterol is different for each person depending on risk factors. Ask your health care provider what your LDL cholesterol number should be.  ? HDL cholesterol. A healthy level of HDL cholesterol is 60 or higher. A number lower than 40 for men or 50 for women is a danger sign.  ? Triglycerides. A healthy triglyceride number is less than 150.  TREATMENT  Dyslipidemia is a treatable condition. Your health care provider will advise you on what type of treatment is best based on your age, your test results, and current guidelines. Treatment may include:

## 2016-07-24 NOTE — Progress Notes
unspecified whether serious comorbidity present E66.09 278.00        1. Atypical Chest Pain. 50 year old white female with significant past medical history for dyslipidemia, hypothyroidism, fibromyalgia, ex-smoker, with family history of coronary artery disease, is coming for a follow-up evaluation and to review the results of her noninvasive testing today. During last visit she complained of a few months of episodes of left-sided chest pain associated with dizziness and exertion for which she underwent transthoracic echocardiogram and nuclear stress testing, which showed normal left ventricular systolic function, no significant valvular disease, and no ischemia. Today she actually says her chest pain episodes have decreased, since her last visit she only had one episode 2 weeks ago which is described as sharp and pressure retrosternal chest pain associated with epigastric pain which actually woke her up from sleep and lasted a few hours. She admits eating more spicy foods lately. She hasn't had episodes of chest pain on exertion since her last visit. She still complaining of dyspnea on exertion, NYHA functional class II, which she only notices when she goes up a couple of flights of stairs. She denies orthopnea, PND, lightheadedness, syncope, dizziness, cough, claudication, lower extremity edema, or any other cardiovascular symptoms. Her cardiovascular exam today is unremarkable. Given her atypical symptoms and the results of her noninvasive testing it is very likely her symptoms are not from heart disease. Her symptoms may be related with reflux or other GI issues. I have recommended to start taking daily PPI and follow-up with her primary care physician for possible GI evaluation. Her mild dyspnea on exertion might be due to deconditioning and obesity. I recommended to continue with low salt, low-cholesterol, low carbs diet and to start an exercise program.   ?  - Continue ASA 81 mg daily.  - RTC in 6 months.

## 2016-07-24 NOTE — Progress Notes
?   estradiol (ESTRACE) 0.1 MG/GM Cream Apply dime sized amount to vagina and urethra twice weekly at bedtime   ? levothyroxine (SYNTHROID, LEVOTHROID) 100 MCG Tablet Take 1 tablet by mouth daily.     No current facility-administered medications on file prior to visit.      Allergies   Allergen Reactions   ? Shellfish Allergy Itching and Swelling   ? Azithromycin Rash     Takes benadryl with this medication.   ? Erythromycin Rash     Takes benadryl with this medication.         Review of Systems  Review of Systems   Constitutional: Negative for diaphoresis, fatigue and fever.   HENT: Negative for congestion, nosebleeds and sneezing.    Eyes: Negative for pain and itching.   Respiratory: Positive for chest tightness. Negative for cough and shortness of breath.    Cardiovascular: Positive for chest pain. Negative for palpitations and leg swelling.   Gastrointestinal: Negative for abdominal pain, blood in stool, constipation, diarrhea, nausea and vomiting.   Genitourinary: Negative for dysuria, frequency and hematuria.   Musculoskeletal: Negative for arthralgias and back pain.   Neurological: Negative for dizziness, syncope and light-headedness.           Objective:        VITAL SIGNS (all recorded)      Clinic Vitals       07/24/16 0917             Amb Encounter Vitals    Weight 102.1 kg (225 lb)    -RW at 07/24/16 0918       Height 1.676 m (5\' 6" )    -RW at 07/24/16 0918       BMI (Calculated) 36.39    -RW at 07/24/16 0918       BSA (Calculated - sq m) 2.18    -RW at 07/24/16 0918       BP 125/86    -RW at 07/24/16 0918       BP Location Left upper arm    -RW at 07/24/16 0918       Position Sitting    -RW at 07/24/16 0918       Pulse 87    -RW at 07/24/16 0918       Pulse Source Brachial    -RW at 07/24/16 0918       O2 Saturation 99 %    -RW at 07/24/16 0918       Pain Score Zero    -RW at 07/24/16 [id number]       Education/Communication Barriers?    Learning/Communication Barriers? No    -RW at 07/24/16 [phone]

## 2016-08-01 ENCOUNTER — Ambulatory Visit: Attending: Physician Assistant | Primary: Family Medicine

## 2016-08-01 ENCOUNTER — Inpatient Hospital Stay: Admit: 2016-08-01 | Discharge: 2016-08-02 | Primary: Family Medicine

## 2016-08-01 DIAGNOSIS — R0789 Other chest pain: Secondary | ICD-10-CM

## 2016-08-01 DIAGNOSIS — Z9189 Other specified personal risk factors, not elsewhere classified: Secondary | ICD-10-CM

## 2016-08-01 DIAGNOSIS — R109 Unspecified abdominal pain: Principal | ICD-10-CM

## 2016-08-01 DIAGNOSIS — S86019A Strain of unspecified Achilles tendon, initial encounter: Secondary | ICD-10-CM

## 2016-08-01 DIAGNOSIS — K8050 Calculus of bile duct without cholangitis or cholecystitis without obstruction: Secondary | ICD-10-CM

## 2016-08-01 DIAGNOSIS — G43909 Migraine, unspecified, not intractable, without status migrainosus: Secondary | ICD-10-CM

## 2016-08-01 DIAGNOSIS — E079 Disorder of thyroid, unspecified: Principal | ICD-10-CM

## 2016-08-01 DIAGNOSIS — E038 Other specified hypothyroidism: Secondary | ICD-10-CM

## 2016-08-01 DIAGNOSIS — Z8639 Personal history of other endocrine, nutritional and metabolic disease: Secondary | ICD-10-CM

## 2016-08-01 DIAGNOSIS — R110 Nausea: Secondary | ICD-10-CM

## 2016-08-01 DIAGNOSIS — E039 Hypothyroidism, unspecified: Secondary | ICD-10-CM

## 2016-08-01 DIAGNOSIS — M797 Fibromyalgia: Secondary | ICD-10-CM

## 2016-08-01 DIAGNOSIS — K219 Gastro-esophageal reflux disease without esophagitis: Secondary | ICD-10-CM

## 2016-08-01 DIAGNOSIS — R3914 Feeling of incomplete bladder emptying: Secondary | ICD-10-CM

## 2016-08-01 DIAGNOSIS — N946 Dysmenorrhea, unspecified: Secondary | ICD-10-CM

## 2016-08-01 DIAGNOSIS — R102 Pelvic and perineal pain: Secondary | ICD-10-CM

## 2016-08-01 LAB — COMPREHENSIVE METABOLIC PANEL
ALK PHOS: 104
ALT: 18
AST: 17
BUN: 13 (ref 4–21)
CREATININE: 0.74
GLUCOSE: 88
Potassium: 5.2
Sodium: 142
Total bilirubin, fluid: 0.3

## 2016-08-01 LAB — CBC AND DIFFERENTIAL
HCT: 43 (ref 36–46)
HCT: 43 (ref 36–46)
HEMOGLOBIN: 14.3 (ref 12.0–16.0)
Hemoglobin: 14.3 (ref 12.0–16.0)
Neutrophils Absolute: 5
PLATELETS: 374 (ref 150–399)
Platelets: 374 (ref 150–399)
WBC: 7.8
WBC: 7.8

## 2016-08-01 LAB — HEPATIC FUNCTION PANEL
ALK PHOS: 104 (ref 25–125)
ALT: 18 (ref 7–35)
ALT: 18 (ref 7–35)
AST: 17 (ref 13–35)
AST: 17 (ref 13–35)
Alkaline Phosphatase: 104 (ref 25–125)
Bilirubin, Total: 0.3

## 2016-08-01 LAB — BASIC METABOLIC PANEL
BUN: 13 (ref 4–21)
BUN: 13 (ref 4–21)
CREATININE: 0.7 (ref <=1.1)
CREATININE: 0.7 (ref <=1.1)
GLUCOSE: 88
Glucose: 88
Potassium: 5.2 (ref 3.4–5.3)
Potassium: 5.2 (ref 3.4–5.3)
SODIUM: 142 (ref 137–147)
Sodium: 142 (ref 137–147)

## 2016-08-01 LAB — CBC
HCT: 43
Hemoglobin: 14.3
PLATELETS: 374
WBC: 7.76

## 2016-08-01 LAB — LIPASE: Lipase: 35

## 2016-08-01 LAB — AMYLASE: Amylase: 56

## 2016-08-01 MED ORDER — ONDANSETRON HCL 4 MG PO TABS
4 mg | Freq: Every day | ORAL | 1 refills | Status: CP | PRN
Start: 2016-08-01 — End: ?

## 2016-08-01 MED ORDER — SUCRALFATE 1 G PO TABS
1 g | Freq: Four times a day (QID) | ORAL | 1 refills | Status: CP
Start: 2016-08-01 — End: 2016-09-19

## 2016-08-01 MED ORDER — PANTOPRAZOLE SODIUM 40 MG PO TBEC
40 mg | Freq: Two times a day (BID) | ORAL | 1 refills | Status: CP
Start: 2016-08-01 — End: 2016-08-08

## 2016-08-01 NOTE — Patient Instructions
Abdominal Pain, Adult  Many things can cause abdominal pain. Usually, abdominal pain is not caused by a disease and will improve without treatment. It can often be observed and treated at home. Your health care provider will do a physical exam and possibly order blood tests and X-rays to help determine the seriousness of your pain. However, in many cases, more time must pass before a clear cause of the pain can be found. Before that point, your health care provider may not know if you need more testing or further treatment.  HOME CARE INSTRUCTIONS  Monitor your abdominal pain for any changes. The following actions may help to alleviate any discomfort you are experiencing:  ? Only take over-the-counter or prescription medicines as directed by your health care provider.  ? Do not take laxatives unless directed to do so by your health care provider.  ? Try a clear liquid diet (broth, tea, or water) as directed by your health care provider. Slowly move to a bland diet as tolerated.  SEEK MEDICAL CARE IF:  ? You have unexplained abdominal pain.  ? You have abdominal pain associated with nausea or diarrhea.  ? You have pain when you urinate or have a bowel movement.  ? You experience abdominal pain that wakes you in the night.  ? You have abdominal pain that is worsened or improved by eating food.  ? You have abdominal pain that is worsened with eating fatty foods.  ? You have a fever.  SEEK IMMEDIATE MEDICAL CARE IF:  ? Your pain does not go away within 2 hours.  ? You keep throwing up (vomiting).  ? Your pain is felt only in portions of the abdomen, such as the right side or the left lower portion of the abdomen.  ? You pass bloody or black tarry stools.  MAKE SURE YOU:  ? Understand these instructions.  ? Will watch your condition.  ? Will get help right away if you are not doing well or get worse.     This information is not intended to replace advice given to you by your

## 2016-08-01 NOTE — Patient Instructions
health care provider. Make sure you discuss any questions you have with your health care provider.     Document Released: 05/31/2005 Document Revised: 05/12/2015 Document Reviewed: 04/30/2013  Elsevier Interactive Patient Education ?2017 [employer]

## 2016-08-02 NOTE — Progress Notes
?   COLONOSCOPY W/ OR W/O BIOPSY N/A 01/27/2016    COLONOSCOPY W/ OR W/O BIOPSY performed by Noel GeroldEid, Amalie F, MD at [facility] ENDO OR   ? CYSTOSCOPY      Surgery Description: Cystoscopy (Diagnostic);  Problem Comments: 12/16/12 (Created by Conversion)   ? DILATION AND CURETTAGE OF UTERUS      Surgery Description: Dilation And Curettage;  Problem Comments: for heavy bleeding after second delivery (Created by Conversion)   ? HYSTERECTOMY     ? NASAL SEPTUM SURGERY      Surgery Description: Nasal Septal Deviation Repair;   (Created by Conversion)   ? OTHER SURGICAL HISTORY      Surgery Description: Laparoscopy With Total Hysterectomy;  Problem Comments: excision of endometriosis-12/16/12 (Created by Conversion)   ? SALPINGO-OOPHORECTOMY      Surgery Description: Salpingo-oophorectomy Left Side;  Problem Comments: 12/16/12 (Created by Conversion)   ? TUBAL LIGATION     ? TUBAL LIGATION      Surgery Description: Tubal Ligation;   (Created by Conversion)     Family History   Problem Relation Age of Onset   ? High Blood Pressure Mother    ? High Blood Pressure Father    ? Cancer Maternal Uncle    ? Heart Attack Paternal Uncle    ? Heart Attack Paternal Grandfather    ? Breast Cancer Neg Hx      Social History     Social History   ? Marital status: Married     Spouse name: N/A   ? Number of children: N/A   ? Years of education: N/A     Occupational History   ? Not on file.     Social History Main Topics   ? Smoking status: Former Smoker   ? Smokeless tobacco: Never Used   ? Alcohol use No      Comment: very rare   ? Drug use: No   ? Sexual activity: Yes     Partners: Male     Other Topics Concern   ? Not on file     Social History Narrative     Current Outpatient Prescriptions on File Prior to Visit   Medication Sig   ? aspirin 81 MG Tablet Take by mouth daily.   ? cholecalciferol (VITAMIN D-3) 1000 UNITS Tablet Take 1,000 Units by mouth daily.   ? cyanocobalamin (VITAMIN B-12) 50 MCG Tablet Take by mouth daily. Pt does

## 2016-08-02 NOTE — Progress Notes
This chart was reviewed and no teaching statement is necessary.

## 2016-08-02 NOTE — Progress Notes
08/01/2016 ?5:36 PM - Lab, Background User   Result Summary for COMPREHENSIVE METABOLIC PANEL   Result Information   ? Status Provider Status      Abnormal Final result (Collected: 08/01/2016 12:55 PM) Reviewed    08/01/2016 ?5:50 PM - Lab, Background User   Component Results   Component Value Flag Ref Range Units Status   Sodium 142  135 - 145 mmol/L Final   Potassium 5.2 (H) 3.3 - 4.6 mmol/L Final   Chloride 101  101 - 110 mmol/L Final   CO2 29  21 - 29 mmol/L Final   Urea Nitrogen 13  6 - 22 mg/dL Final   Creatinine 0.74  0.51 - 0.95 mg/dL Final   BUN/Creatinine Ratio 17.6  6.0 - 22.0 (calc) Final   Glucose 88  71 - 99 mg/dL Final   Calcium 10.3 (H) 8.6 - 10.0 mg/dL Final   Total Protein 8.0  6.5 - 8.3 g/dL Final   Albumin 4.6  3.8 - 4.9 g/dL Final   Calc Total Globuin 3.4   gm/dL Final   ALBUMIN/GLOBULIN RATIO 1.4   (calc) Final   Total Bilirubin 0.3  0.2 - 1.0 mg/dL Final   Alkaline Phosphatase 104  35 - 104 IU/L Final   AST 17  14 - 33 IU/L Final   ALT 18  10 - 42 IU/L Final   Osmolality Calc 282.7    Final   Anion Gap 12  4 - 16 mmol/L Final   EGFR >59   mL/min/1.73M2 Final   Comment:     Reference range: =>90 ml/min/1.73M2   eGFR estimates are unable to accurately differentiate levels of GFR above 60 ml/min/1.73M2.   Reviewed By List   Derenda Mis, PA-C on 08/01/2016 ?8:38 PM   Result Summary for AMYLASE   Result Information   ? Status Provider Status      Normal Final result (Collected: 08/01/2016 12:55 PM) Reviewed    08/01/2016 ?5:50 PM - Lab, Background User   Component Results   Component Value Flag Ref Range Units Status   Amylase 56  30 - 134 U/L Final   Reviewed By List   Derenda Mis, PA-C on 08/01/2016 ?8:38 PM   Result Summary for LIPASE   Result Information   ? Status Provider Status      Normal Final result (Collected: 08/01/2016 12:55 PM) Reviewed    08/01/2016 ?5:50 PM - Lab, Background User   Component Results   Component Value Flag Ref Range Units Status

## 2016-08-02 NOTE — Progress Notes
CO2 28  21 - 29 mmol/L Final   Urea Nitrogen 11  6 - 22 mg/dL Final   Creatinine 0.76  0.51 - 0.95 mg/dL Final   BUN/Creatinine Ratio 14.5  6.0 - 22.0 (calc) Final   Glucose 87  71 - 99 mg/dL Final   Calcium 9.8  8.6 - 10.0 mg/dL Final   Osmolality Calc 276.3    Final   Anion Gap 10  4 - 16 mmol/L Final   EGFR >59   mL/min/1.73M2 Final   Comment:     Reference range: =>90 ml/min/1.73M2   eGFR estimates are unable to accurately differentiate levels of GFR above 60 ml/min/1.73M2.   Result Summary for XR Chest Single View   Result Information   Status Provider Status      Final result (Exam End: 06/19/2016 10:49 AM) Open    06/19/2016 11:19 AM - Interface, Gv Radresults   Narrative   PORTABLE CHEST X-RAY (SINGLE FRONTAL VIEW)    History:50 years Female Chest pain    Comparison: None.     Findings: ?The trachea is midline. The lungs are grossly clear. The costophrenic angles are   sharp. No pleural effusion or pneumothorax is identified. ?The cardiac silhouette, mediastinum,  ?and pulmonary vessels are within normal limits. There is no acute osseous abnormality.     Impression   Impression: No acute cardiopulmonary disease.    I personally reviewed the images and the residents findings and agree with the above.    Read By Ricci Barker M.D.   ?Electronically Verified By - Ricci Barker M.D.   ?Released Date Time - 06/19/2016 11:16 AM   ?Resident - Valorie Roosevelt     Result Summary for Troponin T (Ed -Only)   Result Information   ? Status Provider Status      Normal Final result (Collected: 06/19/2016 10:53 AM) Open    06/19/2016 11:31 AM - Lab, Background User   Component Results   Component Value Flag Ref Range Units Status   Troponin T <0.01  0.00 - 0.04 ng/ml Final   Result Summary for ECG - Adult   Result Information   Status Provider Status      Final result (Collected: 06/19/2016 10:33 AM) Ordered    06/19/2016 12:45 PM - Edi, Rad Results In   Component Results   Component Value Flag Ref Range Units Status

## 2016-08-02 NOTE — Progress Notes
Component Value Flag Ref Range Units Status   TSH 1.050  0.270 - 4.200 mIU/L Final   Result Summary for D-Dimer,Quantitative   Result Information   ? Status Provider Status      Normal Final result (Collected: 06/19/2016 10:53 AM) Open    06/19/2016 11:20 AM - B[place]field, Amily   Component Results   Component Value Flag Ref Range Units Status   D Dimer (hs) <0.27  0.00 - 0.49 ug/mL (FEU) Final   Comment:   A cut-off for the exclusion of DVT and PE has not been established for this method   Result Summary for ECG - Adult   Result Information   Status Provider Status      Final result (Collected: 06/19/2016 12:09 PM) Ordered    06/19/2016 12:39 PM - Edi, Rad Results In   Component Results   Component Value Flag Ref Range Units Status   Ventricular Rate 69   BPM Final   Atrial Rate 69   BPM Final   P-R Interval 176   ms Final   QRS Duration 84   ms Final   Q-T Interval 398   ms Final   QTC Calculation (Bezet) 426   ms Final   Calculated P Axis 47   degrees Final   Calculated R Axis 23   degrees Final   Calculated T Axis 9   degrees Final   Narrative   Normal sinus rhythm  Nonspecific T wave abnormality  Abnormal ECG  When compared with ECG of 19-Jun-2016 10:33, (Unconfirmed)  No significant change was found  Confirmed by Hyacinth MeekerMiller MD, Alan ([id number]) on 06/19/2016 12:39:37 PM   Linked Documents   VIEW EKG   Result Summary for Troponin T (Ed -Only)   Result Information   ? Status Provider Status      Normal Final result (Collected: 06/19/2016 ?1:00 PM) Open    06/19/2016 ?1:22 PM - Lab, Background User   Component Results   Component Value Flag Ref Range Units Status   Troponin T <0.01  0.00 - 0.04 ng/ml Final   Result Summary for CARDIOLOGY TESTING   Result Information   Status         Final result (Resulted: 06/19/2016 12:43 PM)     06/19/2016 12:47 PM - Edi, Transcription   Scans on Order [id number]   Scan on 06/19/2016 1243 by Scanned, Document Onb : EKG - Final ReportScan

## 2016-08-02 NOTE — Progress Notes
Ventricular Rate 75   BPM Final   Atrial Rate 75   BPM Final   P-R Interval 164   ms Final   QRS Duration 90   ms Final   Q-T Interval 370   ms Final   QTC Calculation (Bezet) 413   ms Final   Calculated P Axis 49   degrees Final   Calculated R Axis 34   degrees Final   Calculated T Axis 19   degrees Final   Narrative   Normal sinus rhythm  Normal ECG  When compared with ECG of 13-Dec-2012 10:07,  No significant change was found  Confirmed by Hyacinth MeekerMiller MD, Alan ([id number]) on 06/19/2016 12:45:04 PM   Linked Documents   VIEW EKG   Result Summary for CBC with Differential panel result   Result Information   ? Status Provider Status      Abnormal Final result (Collected: 06/19/2016 10:52 AM) Open    06/19/2016 11:01 AM - Lab, Background User   Component Results   Component Value Flag Ref Range Units Status   WBC 6.67  4.5 - 11 x10E3/uL Final   RBC 4.31  4.20 - 5.40 x10E6/uL Final   Hemoglobin 12.4  12.0 - 16.0 g/dL Final   Hematocrit 54.037.1  37.0 - 47.0 % Final   MCV 86.1  82.0 - 101.0 fl Final   MCH 28.8  27.0 - 34.0 pg Final   MCHC 33.4  31.0 - 36.0 g/dL Final   RDW 98.113.1  19.112.0 - 16.1 % Final   Platelet Count 327  140 - 440 thou/cu mm Final   MPV 10.0  9.5 - 11.5 fl Final   nRBC % 0.0  0.0 - 1.0 % Final   Absolute NRBC Count 0.00    Final   Neutrophils % 61.3  34.0 - 73.0 % Final   Lymphocytes % 30.1  25.0 - 45.0 % Final   Monocytes % 6.9 (H) 2.0 - 6.0 % Final   Eosinophils % 0.7 (L) 1.0 - 4.0 % Final   Immature Granulocytes % 0.3  0.0 - 2.0 % Final   Neutrophils Absolute 4.08  1.80 - 8.70 x10E3/uL Final   Lymphocytes Absolute 2.01   x10E3/uL Final   Monocytes Absolute 0.46   x10E3/uL Final   Eosinophils Absolute 0.05   x10E3/uL Final   Basophil Absolute 0.05   x10E3/uL Final   Basophils % 0.7  0 - 1 % Final   Result Summary for TSH   Result Information   ? Status Provider Status      Normal Final result (Collected: 06/19/2016 10:53 AM) Open    06/19/2016 11:31 AM - Lab, Background User   Component Results

## 2016-08-02 NOTE — Progress Notes
Patient advised to eat a low-fat diet/as tolerated  Patient was given general instructions/printed regarding abdominal pain-patient voiced understanding  Patient was given strong Emergency room precautions-If worsening or localized abdominal pain, fever, rapid heart rate, Red urine, altered mental status dizziness physical or mental deterioration go to the emergency room or call 911  Follow up with PCP/PRN-sooner if needed      Orders Placed This Encounter   Medications   ? sucralfate (CARAFATE) 1 G PO Tablet     Sig: Take 1 tablet by mouth 4 times daily.     Dispense:  120 tablet     Refill:  1   ? pantoprazole (PROTONIX) 40 MG PO Tablet Delayed Release     Sig: Take 1 tablet by mouth 2 times daily.     Dispense:  60 tablet     Refill:  1   ? ondansetron (ZOFRAN) 4 MG PO Tablet     Sig: Take 1 tablet by mouth daily as needed.     Dispense:  30 tablet     Refill:  1     Orders Placed This Encounter   Procedures   ? US Abdomen Complete   ? COMPREHENSIVE METABOLIC PANEL   ? AMYLASE   ? LIPASE   ? Refer to Gastroenterology       Health Maintenance was reviewed. The patient's HM Topic list was:                                            Health Maintenance   Topic Date Due   ? DTaP,Tdap,and Td Vaccines (1 - Tdap) 12/19/1982   ? Pneumovax / Prevnar (1 of 1 - PPSV23) 12/19/1982   ? Preventive Wellness Visit  02/08/2017   ? Breast Cancer Screening  03/23/2018   ? Lipid Profile  06/12/2021   ? Colon Cancer Screening  01/26/2026   ? USPSTF HIV Risk Assessment  Completed   ? USPSTF Hepatitis C Screening  Completed   ? Influenza Vaccine  Completed

## 2016-08-02 NOTE — Progress Notes
She has no cervical adenopathy.   Neurological: She is alert and oriented to person, place, and time.   Skin: Skin is warm. No rash noted.   Psychiatric: She has a normal mood and affect. Her behavior is normal. Judgment and thought content normal.   Nursing note and vitals reviewed.       Assessment:       ICD-10-CM ICD-9-CM    1. Fibromyalgia M79.7 729.1 Refer to Gastroenterology   2. Other specified hypothyroidism E03.8 244.8 Refer to Gastroenterology      sucralfate (CARAFATE) 1 G PO Tablet   3. Atypical chest pain R07.89 786.59 Refer to Gastroenterology      sucralfate (CARAFATE) 1 G PO Tablet      pantoprazole (PROTONIX) 40 MG PO Tablet Delayed Release      CBC AND DIFFERENTIAL   4. Abdominal pain, unspecified abdominal location R10.9 789.00 Refer to Gastroenterology      sucralfate (CARAFATE) 1 G PO Tablet      pantoprazole (PROTONIX) 40 MG PO Tablet Delayed Release      US Abdomen Complete      CBC AND DIFFERENTIAL      COMPREHENSIVE METABOLIC PANEL      AMYLASE      LIPASE   5. Nausea R11.0 787.02 Refer to Gastroenterology      sucralfate (CARAFATE) 1 G PO Tablet      pantoprazole (PROTONIX) 40 MG PO Tablet Delayed Release      US Abdomen Complete      CBC AND DIFFERENTIAL      COMPREHENSIVE METABOLIC PANEL      AMYLASE      LIPASE      ondansetron (ZOFRAN) 4 MG PO Tablet   6. Biliary pain K80.50 574.20 pantoprazole (PROTONIX) 40 MG PO Tablet Delayed Release      US Abdomen Complete      CBC AND DIFFERENTIAL      COMPREHENSIVE METABOLIC PANEL      AMYLASE      LIPASE   7. Acid reflux disease with ulcer K21.9 530.81 pantoprazole (PROTONIX) 40 MG PO Tablet Delayed Release     707.9           Plan:    Generate Note for work  Patient advised to various Tx options and agreed to the following:  Continuation of current medications listed above,the importance of follow up visits.    Risk verses benefits of medication and treatments were discussed.  Patient voiced understanding.

## 2016-08-02 NOTE — Progress Notes
on 06/19/2016 1243 by Scanned, Document Onb : EKG - Final Report      Result Summary for CARDIOLOGY TESTING   Result Information   Status         Final result (Resulted: 06/19/2016 12:55 PM)     06/19/2016 12:58 PM - Edi, Transcription   Scans on Order [id number]   Scan on 06/19/2016 1255 by Scanned, Document Onb : EKG - Final ReportScan on 06/19/2016 1255 by Scanned, Document Onb : EKG - Final Report      Result Summary for CARDIOLOGY TESTING   Result Information   Status         Final result (Resulted: 06/20/2016 ?1:48 PM)     06/20/2016 ?3:01 PM - Edi, Transcription   Scans on Order [id number]   Scan on 06/20/2016 1348 by Scanned, Document Onb : EKG - PRELIMScan on 06/20/2016 1348 by Scanned, Document Onb : EKG - PRELIM              Physical Exam   Constitutional: She is oriented to person, place, and time. She appears well-developed and well-nourished. She appears distressed.   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   Eyes: Right eye exhibits no discharge. Left eye exhibits no discharge. No scleral icterus.   Neck: Normal range of motion. No JVD present. No tracheal deviation present. No thyromegaly present.   Cardiovascular: Normal rate, regular rhythm, normal heart sounds and intact distal pulses.  Exam reveals no gallop and no friction rub.    No murmur heard.  Pulmonary/Chest: Breath sounds normal. She is in respiratory distress. She has no rales. She exhibits no tenderness.   Abdominal: Soft. Bowel sounds are normal. She exhibits no distension and no mass. There is tenderness (diffusely tender, increased tenderness right upper quadrant and epigastric, positive guarding right upper quadrant). There is guarding. There is no rebound. No hernia.   Genitourinary:   Genitourinary Comments: Deferred by patient   Musculoskeletal: She exhibits no edema.   Lymphadenopathy:

## 2016-08-02 NOTE — Progress Notes
Subjective:   Angela Andersen is a 50 y.o. female being seen today for Gastric Issues       Abdominal Pain   This is a chronic problem. The current episode started 1 to 4 weeks ago. The onset quality is gradual. The problem occurs 2 to 4 times per day. The problem has been gradually worsening. The pain is located in the epigastric region. The pain is at a severity of 7/10. The pain is moderate. The quality of the pain is sharp, a sensation of fullness, colicky and burning. The abdominal pain radiates to the epigastric region, back and RUQ. Associated symptoms include nausea and weight loss. Pertinent negatives include no melena or vomiting. The pain is relieved by eating (eating small amounts of food). The treatment provided mild relief. Prior diagnostic workup includes GI consult and ultrasound (G.I. consult an ultrasound requested cardiology workup  completed).    recent  Cardiology neg wu for ischemic heart disease. Pt is here for f/u. Pt reports epigastric abd pain , dull achy, assoc w nausea, relieved  By small amts of food for  Short periods of time. Using OTC nexium w/o effect relief. Pt also reports stabbing pains Left mid axillary line T7/8 area which are sporadic in nature and not pleuritic. Patient is not reporting unusual cough or sputum production. Patient is not reporting fever chills unusual sweats. Patient is not reporting vomiting or diarrhea. Patient has had tenderness and right upper quadrant of the abdomen but without radiation.    Past Medical History:   Diagnosis Date   ? Dysmenorrhea    ? Feeling of incomplete bladder emptying    ? Fibromyalgia    ? H/O hyperkalemia    ? History of general anesthesia complication     VERY sensitive to anesthesia   ? Hypothyroid    ? Migraines     hemiplegic migraine   ? Partial tear of Achilles tendon     left   ? Pelvic pain     before hysterectomy   ? Thyroid disease      Past Surgical History:   Procedure Laterality Date   ? COLONOSCOPY

## 2016-08-02 NOTE — Progress Notes
Lipase 35  0 - 60 U/L Final   Reviewed By List   Hosie PoissonHabell, David F, PA-C on 08/01/2016 ?8:38 PM   Result Summary for CBC with Differential panel result   Result Information   ? Status Provider Status      Abnormal Final result (Collected: 08/01/2016 12:55 PM) Reviewed    08/01/2016 ?5:36 PM - Lab, Background User   Component Results   Component Value Flag Ref Range Units Status   WBC 7.76  4.5 - 11 x10E3/uL Final   RBC 4.90  4.20 - 5.40 x10E6/uL Final   Hemoglobin 14.3  12.0 - 16.0 g/dL Final   Hematocrit 16.142.8  37.0 - 47.0 % Final   MCV 87.3  82.0 - 101.0 fl Final   MCH 29.2  27.0 - 34.0 pg Final   MCHC 33.4  31.0 - 36.0 g/dL Final   RDW 09.613.4  04.512.0 - 16.1 % Final   Platelet Count 374  140 - 440 thou/cu mm Final   MPV 10.6  9.5 - 11.5 fl Final   nRBC % 0.0  0.0 - 1.0 % Final   Absolute NRBC Count 0.00    Final   Neutrophils % 61.7  34.0 - 73.0 % Final   Lymphocytes % 30.0  25.0 - 45.0 % Final   Monocytes % 6.3 (H) 2.0 - 6.0 % Final   Eosinophils % 0.8 (L) 1.0 - 4.0 % Final   Immature Granulocytes % 0.4  0.0 - 2.0 % Final   Neutrophils Absolute 4.79  1.80 - 8.70 x10E3/uL Final   Lymphocytes Absolute 2.33   x10E3/uL Final   Monocytes Absolute 0.49   x10E3/uL Final   Eosinophils Absolute 0.06   x10E3/uL Final   Basophil Absolute 0.06   x10E3/uL Final   Basophils % 0.8  0 - 1 % Final   Reviewed By List   Hosie PoissonHabell, David F, PA-C on 08/01/2016 ?8:38 PM             Result Summary for CBC and Differential   Result Information   Status Provider Status      Final result (Collected: 06/19/2016 10:52 AM) Open    06/19/2016 11:01 AM - Lab, Background User   Result Summary for Basic Metabolic Panel   Result Information   Status Provider Status      Final result (Collected: 06/19/2016 10:53 AM) Open    06/19/2016 11:31 AM - Lab, Background User   Component Results   Component Value Flag Ref Range Units Status   Sodium 139  135 - 145 mmol/L Final   Potassium 4.1  3.3 - 4.6 mmol/L Final   Chloride 101  101 - 110 mmol/L Final

## 2016-08-02 NOTE — Progress Notes
not know dose.   ? estradiol (ESTRACE) 0.1 MG/GM Cream Apply dime sized amount to vagina and urethra twice weekly at bedtime   ? levothyroxine (SYNTHROID, LEVOTHROID) 100 MCG Tablet Take 1 tablet by mouth daily.     No current facility-administered medications on file prior to visit.      Allergies   Allergen Reactions   ? Shellfish Allergy Itching and Swelling   ? Azithromycin Rash     Takes benadryl with this medication.   ? Erythromycin Rash     Takes benadryl with this medication.         Review of Systems  Review of Systems   Constitutional: Positive for activity change, appetite change, fatigue and weight loss.   HENT: Negative.    Eyes: Negative.    Respiratory: Negative.    Cardiovascular: Positive for chest pain.   Gastrointestinal: Positive for abdominal distention, abdominal pain and nausea. Negative for melena and vomiting.   Genitourinary: Negative.    Musculoskeletal: Positive for back pain.   Skin: Negative.    Neurological: Negative.    Hematological: Negative.    Psychiatric/Behavioral: Negative.            Objective:        VITAL SIGNS (all recorded)      Clinic Vitals       08/01/16 1123             Amb Encounter Vitals    Weight 102.1 kg (225 lb)    -LD at 08/01/16 1124       Height 1.676 m (5\' 6" )    -LD at 08/01/16 1124       BMI (Calculated) 36.39    -LD at 08/01/16 1124       BSA (Calculated - sq m) 2.18    -LD at 08/01/16 1124       BP 129/84    -LD at 08/01/16 1124       BP Location Right upper arm    -LD at 08/01/16 1124       Position Sitting    -LD at 08/01/16 1124       Pulse 78    -LD at 08/01/16 1124       Resp 15    -LD at 08/01/16 1124       Temp 36.2 ?C (97.2 ?F)    -LD at 08/01/16 1124       Temperature Source Oral    -LD at 08/01/16 1124       O2 Saturation 98 %    -LD at 08/01/16 1124       Pain Score SEVEN    -LD at 08/01/16 1124         User Key  (r) = Recorded By, (t) = Taken By, (c) = Cosigned By    Initials Name Effective Dates    LD Cordelia Pocheavis, [place], MA 12/07/15 -

## 2016-08-07 ENCOUNTER — Inpatient Hospital Stay: Admit: 2016-08-07 | Discharge: 2016-08-08

## 2016-08-07 ENCOUNTER — Emergency Department: Admit: 2016-08-07 | Discharge: 2016-08-08

## 2016-08-07 DIAGNOSIS — R1013 Epigastric pain: Principal | ICD-10-CM

## 2016-08-07 DIAGNOSIS — R3914 Feeling of incomplete bladder emptying: Secondary | ICD-10-CM

## 2016-08-07 DIAGNOSIS — Z91013 Allergy to seafood: Secondary | ICD-10-CM

## 2016-08-07 DIAGNOSIS — R102 Pelvic and perineal pain: Secondary | ICD-10-CM

## 2016-08-07 DIAGNOSIS — R112 Nausea with vomiting, unspecified: Secondary | ICD-10-CM

## 2016-08-07 DIAGNOSIS — R1011 Right upper quadrant pain: Secondary | ICD-10-CM

## 2016-08-07 DIAGNOSIS — R9431 Abnormal electrocardiogram [ECG] [EKG]: Secondary | ICD-10-CM

## 2016-08-07 DIAGNOSIS — E039 Hypothyroidism, unspecified: Secondary | ICD-10-CM

## 2016-08-07 DIAGNOSIS — M797 Fibromyalgia: Secondary | ICD-10-CM

## 2016-08-07 DIAGNOSIS — Z9189 Other specified personal risk factors, not elsewhere classified: Secondary | ICD-10-CM

## 2016-08-07 DIAGNOSIS — Z7982 Long term (current) use of aspirin: Secondary | ICD-10-CM

## 2016-08-07 DIAGNOSIS — Z87891 Personal history of nicotine dependence: Secondary | ICD-10-CM

## 2016-08-07 DIAGNOSIS — K219 Gastro-esophageal reflux disease without esophagitis: Secondary | ICD-10-CM

## 2016-08-07 DIAGNOSIS — R109 Unspecified abdominal pain: Secondary | ICD-10-CM

## 2016-08-07 DIAGNOSIS — N946 Dysmenorrhea, unspecified: Secondary | ICD-10-CM

## 2016-08-07 DIAGNOSIS — E079 Disorder of thyroid, unspecified: Principal | ICD-10-CM

## 2016-08-07 DIAGNOSIS — G43909 Migraine, unspecified, not intractable, without status migrainosus: Secondary | ICD-10-CM

## 2016-08-07 DIAGNOSIS — S86019A Strain of unspecified Achilles tendon, initial encounter: Secondary | ICD-10-CM

## 2016-08-07 DIAGNOSIS — Z79899 Other long term (current) drug therapy: Secondary | ICD-10-CM

## 2016-08-07 DIAGNOSIS — Z8639 Personal history of other endocrine, nutritional and metabolic disease: Secondary | ICD-10-CM

## 2016-08-07 DIAGNOSIS — R110 Nausea: Secondary | ICD-10-CM

## 2016-08-07 DIAGNOSIS — K8050 Calculus of bile duct without cholangitis or cholecystitis without obstruction: Secondary | ICD-10-CM

## 2016-08-07 MED ORDER — ONDANSETRON HCL 4 MG/2ML IJ SOLN
8 mg | Freq: Once | INTRAVENOUS | Status: CP
Start: 2016-08-07 — End: ?

## 2016-08-07 MED ORDER — PANTOPRAZOLE SODIUM 40 MG PO TBEC
40 mg | Freq: Every day | ORAL | 0 refills | Status: CP
Start: 2016-08-07 — End: 2016-09-19

## 2016-08-07 MED ORDER — MAALOX/HYOSCYAMINE UNIT DOSE JX
Freq: Once | ORAL | Status: CP
Start: 2016-08-07 — End: ?

## 2016-08-07 MED ORDER — FAMOTIDINE 10 MG/ML IV SOLN CUSTOM COMPONENT
20 mg | Freq: Once | INTRAVENOUS | Status: CP
Start: 2016-08-07 — End: ?

## 2016-08-07 MED ORDER — PROMETHAZINE HCL 25 MG/ML IJ SOLN
12.5 mg | Freq: Once | INTRAVENOUS | Status: CP
Start: 2016-08-07 — End: ?

## 2016-08-07 MED ORDER — LIDOCAINE VISCOUS 2 % MT SOLN 15 ML UD
5 mL | Freq: Once | OROMUCOSAL | Status: CP
Start: 2016-08-07 — End: ?

## 2016-08-07 MED ORDER — LORAZEPAM 2 MG/ML IJ SOLN
.5 mg | Freq: Once | INTRAVENOUS | Status: DC
Start: 2016-08-07 — End: 2016-08-08

## 2016-08-07 MED ORDER — PROMETHAZINE HCL 25 MG PO TABS
25 mg | Freq: Three times a day (TID) | ORAL | 0 refills | Status: CP | PRN
Start: 2016-08-07 — End: 2016-09-19

## 2016-08-07 MED ORDER — BOLUS IV FLUID JX
Freq: Once | INTRAVENOUS | Status: CP
Start: 2016-08-07 — End: ?

## 2016-08-07 NOTE — ED Notes
Pt stable, placed back to waiting area and is aware that she will be placed in a room as soon as one becomes available.

## 2016-08-07 NOTE — ED Notes
Pt ambulates into triage c/o abd pain, nausea, and vomiting x 1 week. Pt reports at the beginning of pain eating improved the abd pain and nausea. In the past few days eating worsens the pain and nausea and has caused vomiting. Denies diarrhea, fever, CP, or SOB. Pt reports trying Nexium, carafate, and phenergan with no relief. NAD, A&OX4, clear and appr speech and breath, skin wnl, pt ambulates well without assistance.

## 2016-08-08 ENCOUNTER — Encounter: Primary: Family Medicine

## 2016-08-08 NOTE — ED Notes
Pt awake, alert, NAD. Discussed d/c instructions, prescription and f/u care with patient. Verbalized understanding. Walked from ED with all belongings

## 2016-08-14 NOTE — ED Provider Notes
Bilirubin, Direct 0.2  0.0 - 0.2 mg/dL    Bilirubin, Indirect 0.0  mg/dL    Alkaline Phosphatase 100  35 - 104 IU/L    AST 20  14 - 33 IU/L    ALT 22  10 - 42 IU/L    Total Protein 8.0  6.5 - 8.3 g/dL    ALBUMIN/GLOBULIN RATIO 1.0  (calc)    Calc Total Globuin 4.0  gm/dL   CBC AND DIFFERENTIAL         Imaging (Read by ED Provider):  Per Radiology: [place] Abdomen Complete    Result Date: 08/08/2016  Procedure:  US ABDOMEN COMPLETE Ordering History:  Abdominal pain Additional History: None Technique: Real-time ultrasound images of the abdomen were performed. Comparison: None Result: Pancreas: Visualized portions of the pancreatic body is normal. Portions of the head and tail are obscured by overlying bowel gas Liver: Normal contour. Increased echogenicity of the liver with poor acoustic penetration noted. No focal lesions are identified.  There is no intrahepatic biliary ductal dilatation. Measurements: Portal Vein: 0.9 cm, with hepatopetal flow CBD: 3.8 mm Gallbladder: No shadowing stones, gallbladder wall thickening or pericholecystic fluid. Sonographic Eulah PontMurphy sign is negative. Spleen: The spleen is homogeneous in echotexture measuring 10.4 cm. Right kidney: 10.1 cm in length.  There is normal parenchymal echogenicity.  No focal lesion is  identified.  There is no hydronephrosis. Left kidney: 10.3 cm in length.  There is normal parenchymal echogenicity.  No focal lesion is identified.  There is no hydronephrosis. Proximal aorta:  The visualized proximal aorta is negative. Proximal IVC:  The visualized proximal IVC is negative.     Impression: No cholelithiasis or sonographic evidence of cholecystitis Echogenic liver with poor acoustic penetration suggestive of underlying hepatic steatosis. I personally reviewed the images and the residents findings and agree with the above. Read By Darral Dash- Jerry Matteo M.D.  Electronically Verified By - Darral DashJerry Matteo M.D.  Released Date Time - 08/08/2016 6:54 AM  Resident - Blima SingerMatthew Jenson

## 2016-08-14 NOTE — ED Provider Notes
aspirin 81 MG Tablet Take by mouth daily.Historical Med      cholecalciferol (VITAMIN D-3) 1000 UNITS Tablet Take 1,000 Units by mouth daily.Historical Med      cyanocobalamin (VITAMIN B-12) 50 MCG Tablet Take by mouth daily. Pt does not know dose.Historical Med      estradiol (ESTRACE) 0.1 MG/GM Cream Apply dime sized amount to vagina and urethra twice weekly at bedtimeDisp-1 Tube, R-11, E-Prescribing      levothyroxine (SYNTHROID, LEVOTHROID) 100 MCG Tablet Take 1 tablet by mouth daily.Disp-90 tablet, R-3, DAW, E-PrescribingBrand only synthroid      ondansetron (ZOFRAN) 4 MG PO Tablet Take 1 tablet by mouth daily as needed.Disp-30 tablet, R-1, E-Prescribing      sucralfate (CARAFATE) 1 G PO Tablet Take 1 tablet by mouth 4 times daily.Disp-120 tablet, R-1, E-Prescribing             Past Medical History:   Diagnosis Date   ? Dysmenorrhea    ? Feeling of incomplete bladder emptying    ? Fibromyalgia    ? H/O hyperkalemia    ? History of general anesthesia complication     VERY sensitive to anesthesia   ? Hypothyroid    ? Migraines     hemiplegic migraine   ? Partial tear of Achilles tendon     left   ? Pelvic pain     before hysterectomy   ? Thyroid disease        Past Surgical History:   Procedure Laterality Date   ? COLONOSCOPY     ? COLONOSCOPY W/ OR W/O BIOPSY N/A 01/27/2016    COLONOSCOPY W/ OR W/O BIOPSY performed by Noel GeroldEid, Amalie F, [facility]   ? CYSTOSCOPY      Surgery Description: Cystoscopy (Diagnostic);  Problem Comments: 12/16/12 (Created by Conversion)   ? DILATION AND CURETTAGE OF UTERUS      Surgery Description: Dilation And Curettage;  Problem Comments: for heavy bleeding after second delivery (Created by Conversion)   ? HYSTERECTOMY     ? NASAL SEPTUM SURGERY      Surgery Description: Nasal Septal Deviation Repair;   (Created by Conversion)   ? OTHER SURGICAL HISTORY      Surgery Description: Laparoscopy With Total Hysterectomy;  Problem

## 2016-08-14 NOTE — ED Provider Notes
Comments: excision of endometriosis-12/16/12 (Created by Conversion)   ? SALPINGO-OOPHORECTOMY      Surgery Description: Salpingo-oophorectomy Left Side;  Problem Comments: 12/16/12 (Created by Conversion)   ? TUBAL LIGATION     ? TUBAL LIGATION      Surgery Description: Tubal Ligation;   (Created by Conversion)       Family History   Problem Relation Age of Onset   ? High Blood Pressure Mother    ? High Blood Pressure Father    ? Cancer Maternal Uncle    ? Heart Attack Paternal Uncle    ? Heart Attack Paternal Grandfather    ? Breast Cancer Neg Hx        Social History     Social History   ? Marital status: Married     Spouse name: N/A   ? Number of children: N/A   ? Years of education: N/A     Social History Main Topics   ? Smoking status: Former Smoker   ? Smokeless tobacco: Never Used   ? Alcohol use No      Comment: very rare   ? Drug use: No   ? Sexual activity: Yes     Partners: Male     Other Topics Concern   ? None     Social History Narrative       Review of Systems   Constitutional: Negative for fever, chills, activity change and appetite change.   Respiratory: Negative for shortness of breath.    Cardiovascular: Negative for chest pain.   Gastrointestinal: Positive for nausea, vomiting and abdominal pain. Negative for diarrhea, constipation, blood in stool and abdominal distention.   Genitourinary: Negative for dysuria, urgency, frequency, hematuria, flank pain, vaginal bleeding, vaginal discharge, difficulty urinating and pelvic pain.   Musculoskeletal: Negative for myalgias and back pain.   Neurological: Negative for dizziness, syncope, weakness, light-headedness and headaches.   Allergic/Immunologic: Negative for immunocompromised state.       Physical Exam       ED Triage Vitals   BP 08/07/16 1716 125/88   Pulse 08/07/16 1716 85   Resp 08/07/16 1716 17   Temp 08/07/16 1716 36.6 ?C (97.9 ?F)   Temp src 08/07/16 1716 Oral   Height 08/07/16 1716 1.676 m   Weight 08/07/16 1716 102.2 kg

## 2016-08-14 NOTE — ED Provider Notes
History     Chief Complaint   Patient presents with   ? Abdominal Pain   ? Nausea   ? Vomiting       HPI Comments: Angela Andersen is a 50 y.o. female with a PMH of fibromyalgia, obesity, hypothyroidism who presents with c/o eigastric and RUQ abd pain. The pain has been present for up to 3-4 weeks but only became more bothersome over the past 1 week. The pain is burning, cramping, sharp, and colicky. Does not radiate to back or flank. It is associated w/ nausea and a couple isolated instances of vomiting. Initially, the pain improved w/ eating small amounts of food, but now eating worsens the pain. The type of food does not matter. She is having some acid reflux as well. Denies hematemesis, hematochezia, melena, fever, chills, cp, sob, or urinary sxs. Prior abdominal surgeries include tubal ligation, D&C, laparoscopic hysterectomy and LSO, and colonoscopy 01/2016 (single polyp retreived, path report "early tubal adenoma"). Saw PCP 11/28, referred to GI, given sucralfate, zofran, and protonix. Was using nexium previously w/o relief, was switched to protonix but per pt becuse it was written as BID the pharmacy was unable to fill it so has not been taking a PPI.      The history is provided by the patient. No language interpreter was used.       Allergies   Allergen Reactions   ? Shellfish Allergy Itching and Swelling   ? Azithromycin Rash     Takes benadryl with this medication.   ? Erythromycin Rash     Takes benadryl with this medication.       Discharge Medication List as of 08/07/2016 10:05 PM      START taking these medications    Details   promethazine (PHENERGAN) 25 MG PO Tablet Take 1 tablet by mouth every 8 hours as needed for nausea.Disp-20 tablet, R-0, Normal         CONTINUE these medications which have CHANGED    Details   pantoprazole (PROTONIX) 40 MG PO Tablet Delayed Release Take 1 tablet by mouth daily.Disp-30 tablet, R-0, Normal         CONTINUE these medications which have NOT CHANGED    Details

## 2016-08-14 NOTE — ED Provider Notes
EKG (Read by ED Provider):  NSR at 67 bpm. Intervals WNL. TWI in III and V3. No ST segment changes. No ectopy.      MDM  Number of Diagnoses or Management Options  Epigastric pain: established, worsening  Intractable vomiting with nausea, unspecified vomiting type: established, worsening  RUQ pain: established, worsening     Amount and/or Complexity of Data Reviewed  Clinical lab tests: ordered and reviewed  Tests in the radiology section of CPT?: ordered and reviewed  Discussion of test results with the performing providers: no  Decide to obtain previous medical records or to obtain history from someone other than the patient: no  Obtain history from someone other than the patient: no  Review and summarize past medical records: yes  Discuss the patient with other providers: no  Independent visualization of images, tracings, or specimens: yes    Patient Progress  Patient progress: improved      ED Course & Re-Evaluation     ED Course     Pt has epigatric and RUQ pain w/ n/v. Taking sucralfate and zofran w/o relief. Has been going on for 3-4 weeks but worse x 1 week. Eating initially made it better, now making it worse. Not specific to fatty foods.  No fevers. On exam has tenderness and mild guarding in RUQ and epigastric region. Neg Murphy's.    Given IVF, zofran, pepcid, GI cocktail w/ lidocaine. She then requested more antiemetic so given phenergan. This caused some dyskinesia so I ordered her some ativan, but by the time it was brought to her the twitching had resolved and she no longer required it. This did work for her n/v though, and also her pain did improve w/ the medications given.    Labs were largely wnl. No leukocy tosis. Lipasw and LFTs were normal.  RUQ US negative for any acute gallbladder or hepatobiliary pathology.    More likely gatritis/duodenitis or PUD. She is already referred to GI by her PCP. She likely needs an endoscopy.

## 2016-08-14 NOTE — ED Provider Notes
Osmolality Calc 279.1      Anion Gap 13  4 - 16 mmol/L    EGFR >59  mL/min/1.73M2    Comment:   Reference range: =>90 ml/min/1.73M2  eGFR estimates are unable to accurately differentiate levels of GFR above 60 ml/min/1.73M2.   URINALYSIS W/MICROSCOPY - Abnormal     Color -Ur Yellow  Amber    Clarity, UA Hazy  Hazy    Specific Gravity, Urine 1.020  1.003 - 1.030    pH, Urine 5.0  4.5 - 8.0    Protein-UA Negative  Negative mg/dL    Glucose -Ur Negative  Negative mg/dL    Ketones UA Negative  Negative mg/dL    Bilirubin -Ur Negative  Negative    Blood -Ur Moderate (*) Negative    Nitrite -Ur Negative  Negative    Urobilinogen -Ur Normal  Normal    Leukocytes -Ur Negative  Negative    RBC -Ur 3  0 - 5 /HPF    WBC -Ur 3  0 - 5 /HPF    Squam Epithel, UA 1  Not established /HPF    Bacteria -Ur Rare (*) None seen /HPF    Mucus -Ur Rare  2+ /LPF    ASCORBIC ACID Negative  20 mg/dL   CBC AUTODIFF - Abnormal     WBC 9.35  4.5 - 11 x10E3/uL    RBC 4.75  4.20 - 5.40 x10E6/uL    Hemoglobin 13.6  12.0 - 16.0 g/dL    Hematocrit 41.3  37.0 - 47.0 %    MCV 86.9  82.0 - 101.0 fl    MCH 28.6  27.0 - 34.0 pg    MCHC 32.9  31.0 - 36.0 g/dL    RDW 13.0  12.0 - 16.1 %    Platelet Count 377  140 - 440 thou/cu mm    MPV 10.1  9.5 - 11.5 fl    nRBC % 0.0  0.0 - 1.0 %    Absolute NRBC Count 0.00      Neutrophils % 58.6  34.0 - 73.0 %    Lymphocytes % 33.4  25.0 - 45.0 %    Monocytes % 6.1 (*) 2.0 - 6.0 %    Eosinophils % 0.9 (*) 1.0 - 4.0 %    Immature Granulocytes % 0.3  0.0 - 2.0 %    Neutrophils Absolute 5.48  1.80 - 8.70 x10E3/uL    Lymphocytes Absolute 3.12  x10E3/uL    Monocytes Absolute 0.57  x10E3/uL    Eosinophils Absolute 0.08  x10E3/uL    Basophil Absolute 0.07  x10E3/uL    Basophils % 0.7  0 - 1 %   LIPASE - Normal    Lipase 28  0 - 60 U/L   TROPONIN T (ED -ONLY) - Normal    Troponin T <0.01  0.00 - 0.04 ng/ml   HEPATIC FUNCTION PANEL    Albumin 4.0  3.8 - 4.9 g/dL    Total Bilirubin 0.2  0.2 - 1.0 mg/dL

## 2016-08-14 NOTE — ED Provider Notes
I rewrote her rx for protonix for QD for and PCP can work w/ pharmacy if they desire BID dosing. Continue sucralfate. Small bland meals, given GERD food list. Also given phenergan PO to alternate w/ her home zofran if necessary.    RTER precautions discussed with patient/family, otherwise, recommended follow up with PCP/GI in 5-7 days for further evaluation and mgmt. Patient/family state understanding and agree w/ plan.      ED Disposition   ED Disposition: Discharge      ED Clinical Impression   ED Clinical Impression:   Epigastric pain  RUQ pain  Intractable vomiting with nausea, unspecified vomiting type  Abdominal pain, unspecified abdominal location  Nausea  Biliary pain  Acid reflux disease with ulcer      ED Patient Status   Patient Status:   Good        ED Medical Evaluation Initiated   Medical Evaluation Initiated:   Yes, filed at 08/07/16 1756  by Rica KoyanagiShirley, Kaleena, PA-C

## 2016-08-14 NOTE — ED Provider Notes
SpO2 08/07/16 1716 97 %   BMI (Calculated) 08/07/16 1716 36.46             Physical Exam   Constitutional: She is oriented to person, place, and time. She appears well-developed and well-nourished. No distress.   obese   HENT:   Head: Normocephalic and atraumatic.   Mouth/Throat: Oropharynx is clear and moist and mucous membranes are normal.   Eyes: Conjunctivae are normal. No scleral icterus.   Neck: Normal range of motion. Neck supple.   Cardiovascular: Normal rate, regular rhythm, normal heart sounds and intact distal pulses.  Exam reveals no gallop and no friction rub.    No murmur heard.  Pulmonary/Chest: Effort normal and breath sounds normal. No respiratory distress.   Abdominal: Soft. Bowel sounds are normal. She exhibits no distension and no mass. There is tenderness in the right upper quadrant and epigastric area. There is guarding (mild). There is no rebound, no CVA tenderness, no tenderness at McBurney's point and negative Murphy's sign.   Musculoskeletal: Normal range of motion. She exhibits no edema.   Neurological: She is alert and oriented to person, place, and time.   Skin: Skin is warm and dry. She is not diaphoretic.   Psychiatric: She has a normal mood and affect. Her behavior is normal.   Nursing note and vitals reviewed.      Differential DDx: Gastritis, gerd, pancreatitis, GB, hepatic dysfunction, colitis, renal, vascular, pulmonary, other      Is this an Emergent Medical Condition? Yes - Severe Pain/Acute Onset of Symptons  409.901 FS  641.19 FS  627.732 (16) FS    ED Workup   Procedures    Labs:  -   BASIC METABOLIC PANEL - Abnormal        Result Value Ref Range    Sodium 140  135 - 145 mmol/L    Potassium 3.9  3.3 - 4.6 mmol/L    Chloride 98 (*) 101 - 110 mmol/L    CO2 29  21 - 29 mmol/L    Urea Nitrogen 13  6 - 22 mg/dL    Creatinine 1.610.76  0.960.51 - 0.95 mg/dL    BUN/Creatinine Ratio 17.1  6.0 - 22.0 (calc)    Glucose 91  71 - 99 mg/dL    Calcium 9.8  8.6 - 04.510.0 mg/dL

## 2016-08-15 ENCOUNTER — Ambulatory Visit: Attending: Family Medicine | Primary: Family Medicine

## 2016-08-15 ENCOUNTER — Ambulatory Visit: Attending: Family | Primary: Family Medicine

## 2016-08-15 DIAGNOSIS — M797 Fibromyalgia: Secondary | ICD-10-CM

## 2016-08-15 DIAGNOSIS — R102 Pelvic and perineal pain: Secondary | ICD-10-CM

## 2016-08-15 DIAGNOSIS — R110 Nausea: Secondary | ICD-10-CM

## 2016-08-15 DIAGNOSIS — R1013 Epigastric pain: Principal | ICD-10-CM

## 2016-08-15 DIAGNOSIS — G43909 Migraine, unspecified, not intractable, without status migrainosus: Secondary | ICD-10-CM

## 2016-08-15 DIAGNOSIS — E038 Other specified hypothyroidism: Secondary | ICD-10-CM

## 2016-08-15 DIAGNOSIS — Z8639 Personal history of other endocrine, nutritional and metabolic disease: Secondary | ICD-10-CM

## 2016-08-15 DIAGNOSIS — E039 Hypothyroidism, unspecified: Secondary | ICD-10-CM

## 2016-08-15 DIAGNOSIS — E079 Disorder of thyroid, unspecified: Principal | ICD-10-CM

## 2016-08-15 DIAGNOSIS — R0789 Other chest pain: Secondary | ICD-10-CM

## 2016-08-15 DIAGNOSIS — R109 Unspecified abdominal pain: Secondary | ICD-10-CM

## 2016-08-15 DIAGNOSIS — Z6836 Body mass index (BMI) 36.0-36.9, adult: Secondary | ICD-10-CM

## 2016-08-15 DIAGNOSIS — R8290 Unspecified abnormal findings in urine: Secondary | ICD-10-CM

## 2016-08-15 DIAGNOSIS — S86019A Strain of unspecified Achilles tendon, initial encounter: Secondary | ICD-10-CM

## 2016-08-15 DIAGNOSIS — R3914 Feeling of incomplete bladder emptying: Secondary | ICD-10-CM

## 2016-08-15 DIAGNOSIS — Z9189 Other specified personal risk factors, not elsewhere classified: Secondary | ICD-10-CM

## 2016-08-15 DIAGNOSIS — N946 Dysmenorrhea, unspecified: Secondary | ICD-10-CM

## 2016-08-15 LAB — HEPATIC FUNCTION PANEL
ALT: 18 (ref 7–35)
AST: 17 (ref 13–35)
Alkaline Phosphatase: 104 (ref 25–125)
Bilirubin, Total: 0.3

## 2016-08-15 LAB — CBC AND DIFFERENTIAL
HEMATOCRIT: 43 (ref 36–46)
HEMOGLOBIN: 14.3 (ref 12.0–16.0)
Neutrophils Absolute: 5
Platelets: 374 (ref 150–399)
WBC: 7.8

## 2016-08-15 LAB — BASIC METABOLIC PANEL
BUN: 13 (ref 4–21)
CREATININE: 0.7 (ref <=1.1)
GLUCOSE: 88
POTASSIUM: 5.2 (ref 3.4–5.3)
SODIUM: 142 (ref 137–147)

## 2016-08-15 NOTE — Patient Instructions
Gastritis, Adult  Gastritis is soreness and puffiness (inflammation) of the lining of the stomach. If you do not get help, gastritis can cause bleeding and sores (ulcers) in the stomach.  HOME CARE   ? Only take medicine as told by your doctor.  ? If you were given antibiotic medicines, take them as told. Finish the medicines even if you start to feel better.  ? Drink enough fluids to keep your pee (urine) clear or pale yellow.  ? Avoid foods and drinks that make your problems worse. Foods you may want to avoid include:  ? Caffeine or alcohol.  ? Chocolate.  ? Mint.  ? Garlic and onions.  ? Spicy foods.  ? Citrus fruits, including oranges, lemons, or limes.  ? Food containing tomatoes, including sauce, chili, salsa, and pizza.  ? Fried and fatty foods.  ? Eat small meals throughout the day instead of large meals.  GET HELP RIGHT AWAY IF:   ? You have black or dark red poop (stools).  ? You throw up (vomit) blood. It may look like coffee grounds.  ? You cannot keep fluids down.  ? Your belly (abdominal) pain gets worse.  ? You have a fever.  ? You do not feel better after 1 week.  ? You have any other questions or concerns.  MAKE SURE YOU:   ? Understand these instructions.  ? Will watch your condition.  ? Will get help right away if you are not doing well or get worse.  This information is not intended to replace advice given to you by your health care provider. Make sure you discuss any questions you have with your health care provider.  Document Released: 02/07/2008 Document Revised: 11/13/2011 Document Reviewed: 05/15/2015  Elsevier Interactive Patient Education ? 2017 [employer]

## 2016-08-15 NOTE — Progress Notes
promethazine (PHENERGAN) 25 MG PO Tablet Take 1 tablet by mouth every 8 hours as needed for nausea. 08/07/16  Yes Rica KoyanagiShirley, Kaleena, PA-C   sucralfate (CARAFATE) 1 G PO Tablet Take 1 tablet by mouth 4 times daily. 08/01/16  Yes Habell, Candace CruiseDavid F, PA-C   aspirin 81 MG Tablet Take by mouth daily.    Information, Historical         Review of Systems  Constitution - patient denies fevers chills or weight loss.  Musculoskeletal - patient denies muscle pain and weakness and difficulty with gait  Hematological - patient denies easy bleeding and bruising.  Neurological - patient denies headache or loss of consciousness.  Allergic/immune - patient denies runny nose, dry eyes, skin itching and recurrent infections.   Eye - patient denies eye pain or redness.  Skin - patient denies itching and yellow color to skin.  Psychiatric - patient denies active depression and anxiety.  Cardiopulmonary -  Denies chest pain, shortness of breath, cough, and palpitations  Urinary -  Denies hematuria, dysuria, and decreased urination.  G I -  Denies  vomiting, heartburn, dysphagia, odynophagia, diarrhea, constipation, melena, hematochezia, and anorexia. +nausea, abdominal pain    All other systems reviewed and negative.      Physical Exam:     VITAL SIGNS (all recorded)      Clinic Vitals       08/15/16 0852 08/15/16 1545          Amb Encounter Vitals    Weight 102.6 kg (226 lb 3.2 oz)    -AW at 08/15/16 0855 102.7 kg (226 lb 6.4 oz)    -AS at 08/15/16 1545      Height 1.68 m (5' 6.13")    -AW at 08/15/16 0855 1.676 m (5\' 6" )    -AS at 08/15/16 1545      BMI (Calculated) 36.45    -AW at 08/15/16 0855 36.62    -AS at 08/15/16 1545      BSA (Calculated - sq m) 2.19    -AW at 08/15/16 0855 2.19    -AS at 08/15/16 1545      BP 124/83    -AW at 08/15/16 0855 125/86    -AS at 08/15/16 1545      BP Location Right lower arm    -AW at 08/15/16 0855       Position Sitting    -AW at 08/15/16 0855       Pulse 71    -AW at 08/15/16 0855 91

## 2016-08-15 NOTE — Progress Notes
Chief Complaint: Nausea and Abdominal Pain      Referring Provider: Hosie Poissonavid F Habell, PA-C  [address]  [place], [place] [id number]      History of Present Illness:  Patient is a 50 year old female referred here for noncardiac chest pain.  She reports epigastric pain and nausea for about 5 weeks now.  Was started on protonix and zofran which help some. Decreased appetite, more belching. Went to the ER on 08/07/16 for epigastric abdominal pain and was treated with a GI cocktail which helped but wore off and had an U/S done, see results below. Had never had an EGD.    Severity: 2/10 now  Quality: ache, dull  Location: epigastric  Duration: weeks  Context: int  Timing: improves with eating  Associated symptoms: abdominal pain, nausea  Modifying factors: protonix, zofran  Laterality n/a  Acuity acute  Specificity in the location of the disease manifestations. abdomen  Timing of encounters. int  Manifestations and underlying conditions of the primary diagnosis.  History of use and dependence of tobacco, alcohol, and drugs    The patient's medical records and laboratory data was reviewed.      U/S 08/07/16-  Impression:  ?No cholelithiasis or sonographic evidence of cholecystitis  ?Echogenic liver with poor acoustic penetration suggestive of underlying hepatic steatosis.      Colonoscopy 01/27/16-  BBPS=6  One 4 mm polyp in the descending colon. Resected and  ?? ? ? ? ? ? ? ? ? ? retrieved.  Recommendation: ? ? ?- Await pathology results.  ?? ? ? ? ? ? ? ? ? ? - Repeat colonoscopy in 5 years for surveillance based  ?? ? ? ? ? ? ? ? ? ? on pathology results.    Descending colon, polyp, biopsy:  ?  Early tubular adenoma         Ref. Range 10/21/2015 08:05   HEPATITIS C ANTIBODY Latest Ref Range: Negative S/CO Negative        has a past medical history of Dysmenorrhea; Feeling of incomplete bladder emptying; Fibromyalgia; H/O hyperkalemia; History of general anesthesia

## 2016-08-15 NOTE — Progress Notes
thought pattern, no flight of ideas or pressured speech.        ASSESSMENT    ICD-10-CM ICD-9-CM    1. Fibromyalgia M79.7 729.1 Refer to Gastroenterology   2. Other specified hypothyroidism E03.8 244.8 Refer to Gastroenterology   3. Atypical chest pain R07.89 786.59 Refer to Gastroenterology   4. Abdominal pain, unspecified abdominal location R10.9 789.00 Refer to Gastroenterology      Surgical Case Request: EGD W/ BIOPSY      Surgical Case Request: EGD W/ BIOPSY   5. Nausea R11.0 787.02 Refer to Gastroenterology      Surgical Case Request: EGD W/ BIOPSY      Surgical Case Request: EGD W/ BIOPSY         PLAN:   EGD w/ bx r/o h pylori- [place] [facility] [place] [facility]  RTC PRN after procedure        COUNSELING:  Esophagogastroduoden:  The risks, benefits and alternatives were explained in detail to the patient.  Risks including but not limited to bleeding, perforation, adverse reaction to medications, cardiopulmonary compromise and their attendant sequelae explained.  Sequelae including but not limited to need for surgery, prolonged hospital stay, placement of drainage tubes, blood transfusions, disability, deformity, morbidity, and mortality were explained.   she indicates understanding and wishes to proceed.

## 2016-08-15 NOTE — ED Attestation Note
Attestation   The patient was seen exclusively by the PA/ARNP, and was not seen by me. I was immediately available in the Emergency Department for consultation as needed.             Dishong, Harlon DittyWilliam Patrick, MD  08/15/16 [phone]

## 2016-08-15 NOTE — Progress Notes
-  AS at 08/15/16 1545      Pulse Source Brachial    -AW at 08/15/16 0855       Pulse Quality Normal    -AW at 08/15/16 0855       Resp 16    -AW at 08/15/16 0855 16    -AS at 08/15/16 1545      Respiration Quality Normal    -AW at 08/15/16 0855       Temp 36.4 ?C (97.5 ?F)    -AW at 08/15/16 0855 36.3 ?C (97.4 ?F)    -AS at 08/15/16 1545      Temperature Source Oral    -AW at 08/15/16 0855       O2 Saturation 98 %    -AW at 08/15/16 0855       FiO2 Source RA    -AW at 08/15/16 0855       Pain Score Zero    -AW at 08/15/16 0855       Last Menstrual Period --   Hysterectomy     -AW at 08/15/16 [id number]       Education/Communication Barriers?    Learning/Communication Barriers? No    -AW at 08/15/16 [id number]       Fall Risk Assessment    Had recent fall / Last 6 months? No recent fall    -AW at 08/15/16 [id number]       Does patient have a fear of falling? No    -AW at 08/15/16 [id number]         User Key  (r) = Recorded By, (t) = Taken By, (c) = Cosigned By    [facility] Name Effective Dates    AW [place], [place], [place] 12/07/15 -     AS Enzo BiSmith, Audrey, [place] 07/25/16 -         Body mass index is 36.54 kg/(m^2).    CONSTITUTION (General Appearance) - Healthy appearing at stated age.   HEAD AND NECK -  Pupils equal and reactive.  No icterus, pallor, congestion of nasal passages, lymphadenopathy.  SKIN - No rash or jaundice.  EYES - Pupils react normally and are non icteric.  ENT- Normal mucosal membranes and throat. normal dentition.  RESPIRATORY - Lungs clear to auscultation, normal respiratory effort.  CARDIOVASCULAR -  Normal heart sounds, normal abdominal aorta. Peripheral pulses present.   ABDOMEN -  Soft, nontender.  No organomegaly.  Normal bowel sounds. Non-distended.  EXTREMITIES -   No edema. Normal color. No varices  MUSCULOSKELETAL -Normal gait.  NEUROLOGICAL - No obvious deficits. Normal reflexes and sensation.  PSYCHIATRIC - Normal mood and oriented to person place and time, normal

## 2016-08-15 NOTE — Progress Notes
No teaching statement needed

## 2016-08-15 NOTE — Progress Notes
complication; Hypothyroid; Migraines; Partial tear of Achilles tendon; Pelvic pain; and Thyroid disease. She also has no past medical history of BRCA1 positive; BRCA2 positive; Breast atypical hyperplasia; Breast discharge; Breast injury; Breast mass; Cancer; Colon cancer; Endometrial cancer; History of chemotherapy; History of radiation therapy; or Inverted nipple.     has a past surgical history that includes Tubal ligation; Colonoscopy; Nasal septum surgery; Tubal ligation; Dilation and curettage of uterus; other surgical history; Cystoscopy; Salpingo-oophorectomy; Hysterectomy; and colonoscopy w/ or w/o biopsy (N/A, 01/27/2016).    is allergic to shellfish allergy; azithromycin; and erythromycin.    family history includes Cancer in her maternal uncle; Heart Attack in her paternal grandfather and paternal uncle; High Blood Pressure in her father and mother. There is no history of Breast Cancer.     reports that she has quit smoking. She has never used smokeless tobacco. She reports that she drinks about 5.0 oz of alcohol per week  She reports that she does not use illicit drugs.    Current Outpatient Medications    Medication Sig Start Date End Date Taking? Authorizing Provider   cholecalciferol (VITAMIN D-3) 1000 UNITS Tablet Take 1,000 Units by mouth daily.   Yes Information, Historical   cyanocobalamin (VITAMIN B-12) 50 MCG Tablet Take by mouth daily. Pt does not know dose.   Yes Information, Historical   estradiol (ESTRACE) 0.1 MG/GM Cream Apply dime sized amount to vagina and urethra twice weekly at bedtime 11/22/15  Yes Gabriel Rung, ARNP   levothyroxine (SYNTHROID, LEVOTHROID) 100 MCG Tablet Take 1 tablet by mouth daily. 10/07/15  Yes Scuderi, Christopher B, DO   ondansetron (ZOFRAN) 4 MG PO Tablet Take 1 tablet by mouth daily as needed. 08/01/16  Yes Habell, Wilford Corner, PA-C   pantoprazole (PROTONIX) 40 MG PO Tablet Delayed Release Take 1 tablet by mouth daily. 08/07/16  Yes Lorre Munroe, PA-C

## 2016-08-16 NOTE — Progress Notes
Left kidney: 10.3 cm in length.  There is normal parenchymal echogenicity.  No focal lesion is   identified.  There is no hydronephrosis.   ?  Proximal aorta:  The visualized proximal aorta is negative.  Proximal IVC:  The visualized proximal IVC is negative.  ?  IMPRESSION:  Impression:  ?  No cholelithiasis or sonographic evidence of cholecystitis  ?  Echogenic liver with poor acoustic penetration suggestive of underlying hepatic steatosis.  ?  I personally reviewed the images and the residents findings and agree with the above.  ?  Read By Darral Dash- Jerry Matteo M.D.    Electronically Verified By - Darral DashJerry Matteo M.D.    Released Date Time - 08/08/2016 6:54 AM    Resident - Blima SingerMatthew Jenson  ?   ?     GATED STRESS & REST MYOCARDIAL PERFUSION IMAGING  Patient Name:  Angela Andersen, [name]     PROTOCOL:Treadmill   MR Number:  [id number]  [facility]  DOB: Dec 19, 1965  Age: [age]  Race: C     Sex: F      Ht(In): 6066  Wt(Lb): 225     PREGNANT: No        BREAST FEEDING:  No    Rest Study Test Date:    06/29/2016  JYNW:2956Time:0843  Stress Study Test Date:    06/29/2016    Time:1000     INTERPRETING MD:  Doreen SalvageMARTIN ZENNI, M.D.  NURSE:  Edyth Gunnels. SIrmans  PRIMARY MD:  Hilda LiasHRISTOPHER SCUDERI, M.D.  ORDERING MD:  Dionne BucyAndres Pineda Maldonado, M.D.    CARDIOLOGIST:  Dionne BucyAndres Pineda Maldonado, M.D.   FELLOW:  Eunice BlaseVictor Kairouz, M.D.  Robb MatarKhadeeja Esmail,   M.D.P & INDICATION: 50 y/o WF with HLD, Obesity with chest pain and DOE.       ?  STRESS METHODOLOGY: ADMINISTERED BY: C. Rotman CNMT  IV: L arm     STRESS IMAGING: ISOTOPE: Tc 4259m MIBI   DOSE IV=32.4 millicuries  OZHY:8657TIME:0925  GATED: Yes    REST     IMAGING:  ISOTOPE: Tc 9659m MIBI   DOSE IV=9.23 millicuries  QION:6295TIME:0758  GATED: Yes     BREAST ATTENUATION:          Yes    QUALITY: Good  STUDY: Diagnostic  DELAYED:  No              GUT ATTENUATION: Yes     RAW DATA:tHYROID UPTAKE    ?  STRESS DATA: The stress EKG is reported separately.   ?  MYOCARDIAL PERFUSION IMAGING: Stress & Rest reviewed in short, vertical

## 2016-08-16 NOTE — Progress Notes
Shortness of breath   Vomiting and inability to keep down liquids   Severe pain or worsening pain   Mental status changes  No improvement in 3-5 days   Worsening of symptoms   Any adverse affects they feel is due to any of their current medications             2. 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 BMI 36  Makinley's Estimated body mass index is 36.37 kg/(m^2) as calculated from the following:    Height as of this encounter: 1.68 m (5' 6.13").    Weight as of this encounter: 102.6 kg (226 lb 3.2 oz).      The health risks associated with an elevated BMI were discussed and education was provided in the AVS.  See orders for any further follow up plans.  We reviewed with the patient their BMI is currently 26.5 or greater  This can seriously impact their personal risk of disease and mortality. We clearly reviewed their risks. To address this higher than normal BMI I recommend the following.  Recommend 30 minutes of moderate exercise as tolerated 5 x a week and 2 strength extra  training sessions  Recommend 7% of bodyweight loss  Recommend a plant-based diet  Recommend patient track their activity and try to limit sitting for prolonged periods   Avoid liquid calories and limit processed foods  Recheck labs in 3 months   We stresssed the importance of consistency         No orders of the following type(s) were placed in this encounter: Medications.     Orders Placed This Encounter   Procedures   ? Urinalysis W/Microscopy       Health Maintenance was reviewed. The patient's HM Topic list was:                                            Health Maintenance   Topic Date Due   ? DTaP,Tdap,and Td Vaccines (1 - Tdap) 12/19/1982   ? Pneumovax / Prevnar (1 of 1 - PPSV23) 12/19/1982   ? Preventive Wellness Visit  02/08/2017   ? Breast Cancer Screening  03/23/2018

## 2016-08-16 NOTE — Progress Notes
long & horizontal long   axis.  PERFUSION:                NORMAL  SUMMED STRESS SCORE = 0           SUMMED REST SCORE =0                    SUMMED STRESS   DIFFERENCE =0   ?  GATED SPECT IMAGING: GATED SPECT images diagnostic: Y  LVEF Stress (P) =73%           EDV =57 ml:    ESV =16 ml            WALL MOTION:Normal           ??  LVEF Stress (S) =73%           EDV =65 ml:    ESV =18 ml            WALL MOTION:Normal           ??  LVEF Rest          =72%           EDV =69 ml:    ESV =19 ml            WALL MOTION:Normal    LV SIZE      =Normal                Transient Ischemic Dilatation (TID) is: 0.9    LHR; 0.23   ?  CONCLUSIONS:  1) Normal myocardial perfusion scan. No inducible ischemia and no scar.  2) The stress ECG is reported separately.   3) Left Ventricular systolic function is normal overall with a  LVEF of 72 % at rest and  73%   post stress with normal wall motion.   4) Thyroid uptake homegenous  ??  Supervision provided, test interpreted, report personally reviewed by and agreed to by:  ?  _________________________  Angela Andersen, M.D.Marland Kitchen.x    This document was electronically signed by Angela Andersen, M.D. on Date: 06/29/2016 at Time: 3:58   PM.  ?  Read By Angela Salvage- Martin Andersen    Electronically Verified By - Angela Andersen    Released Date Time - 06/29/2016 3:58 PM    Resident -   ?      Results for Angela Andersen, Angela Andersen (MRN [id number]) as of 08/15/2016 09:05   Ref. Range 08/01/2016 12:55 08/07/2016 18:40 08/07/2016 18:59   TROPONIN T Latest Ref Range: 0.00 - 0.04 ng/ml   <0.01   SODIUM Latest Ref Range: 135 - 145 mmol/L 142  140   POTASSIUM, SERUM Latest Ref Range: 3.3 - 4.6 mmol/L 5.2 (H)  3.9   CHLORIDE, SERUM Latest Ref Range: 101 - 110 mmol/L 101  98 (L)   CARBON DIOXIDE TOTAL Latest Ref Range: 21 - 29 mmol/L 29  29   Urea Nitrogen Latest Ref Range: 6 - 22 mg/dL 13  13   CREATININE Latest Ref Range: 0.51 - 0.95 mg/dL 5.400.74  9.810.76   GLUCOSE, SERUM Latest Ref Range: 71 - 99 mg/dL 88  91

## 2016-08-16 NOTE — Progress Notes
CALCIUM, SERUM Latest Ref Range: 8.6 - 10.0 mg/dL 10.3 (H)  9.8   Calc Total Globuin Latest Units: gm/dL 3.4  4.0   ANION GAP Latest Ref Range: 4 - 16 mmol/L 12  13   BUN/Creatinine Ratio Latest Ref Range: 6.0 - 22.0 (calc) 17.6  17.1   TOTAL PROTEIN Latest Ref Range: 6.5 - 8.3 g/dL 8.0  8.0   ALBUMIN Latest Ref Range: 3.8 - 4.9 g/dL 4.6  4.0   ALBUMIN/GLOBULIN RATIO Latest Units: (calc) 1.4  1.0   AST Latest Ref Range: 14 - 33 IU/L 17  20   ALT Latest Ref Range: 10 - 42 IU/L 18  22   EGFR Latest Units: mL/min/1.73M2 >59  >59   Bilirubin, Direct Latest Ref Range: 0.0 - 0.2 mg/dL   0.2   Bilirubin, Indirect Latest Units: mg/dL   0.0   Total Bilirubin Latest Ref Range: 0.2 - 1.0 mg/dL 0.3  0.2   Alkaline Phosphatase Latest Ref Range: 35 - 104 IU/L 104  100   Osmolality Calc Unknown 282.7  279.1   Amylase Latest Ref Range: 30 - 134 U/L 56     Lipase Latest Ref Range: 0 - 60 U/L 35  28   WBC Latest Ref Range: 4.5 - 11 x10E3/uL 7.76  9.35   RBC Latest Ref Range: 4.20 - 5.40 x10E6/uL 4.90  4.75   HEMOGLOBIN Latest Ref Range: 12.0 - 16.0 g/dL 14.3  13.6   HEMATOCRIT Latest Ref Range: 37.0 - 47.0 % 42.8  41.3   MCV Latest Ref Range: 82.0 - 101.0 fl 87.3  86.9   MCH Latest Ref Range: 27.0 - 34.0 pg 29.2  28.6   MCHC Latest Ref Range: 31.0 - 36.0 g/dL 33.4  32.9   RDW Latest Ref Range: 12.0 - 16.1 % 13.4  13.0   PLATELET COUNT Latest Ref Range: 140 - 440 thou/cu mm 374  377   MPV Latest Ref Range: 9.5 - 11.5 fl 10.6  10.1   Neutrophils Latest Ref Range: 34.0 - 73.0 % 61.7  58.6   IMMATURE GRANULOCYTES Latest Ref Range: 0.0 - 2.0 % 0.4  0.3   Lymphocytes % Latest Ref Range: 25.0 - 45.0 % 30.0  33.4   MONOCYTES Latest Ref Range: 2.0 - 6.0 % 6.3 (H)  6.1 (H)   EOSINOPHILS Latest Ref Range: 1.0 - 4.0 % 0.8 (L)  0.9 (L)   nRBC % Latest Ref Range: 0.0 - 1.0 % 0.0  0.0   Basophils % Latest Ref Range: 0 - 1 % 0.8  0.7   Neutrophils Absolute Latest Ref Range: 1.80 - 8.70 x10E3/uL 4.79  5.48

## 2016-08-16 NOTE — Progress Notes
Subjective:   Angela Andersen is a 50 y.o. female being seen today for GI Problem and Fatigue (Two month f/u. )       HPI       Pnt is here for an acute issue. She has had had epigastric pain x 2 months. Had cardiac workup which was unremarkable.  Cardiology recommended a GI workup. She has been eating small amounts to keep pain and nausea down.  Pain waxes and wanes in intensity. Has had some pain free days. Scheduled to see GI today to discuss EGD vs further imaging.  Pain is center and epigastric.  Pain is 2/10 currently mild.  She feels like the protonix has helped.  She states her nausea is better.  Pain has improved since last week. US showed fatty liver no gallstones. BM's are tending towards constipation.  No blood or black in her stools. She states her stools have been more green.  Had colonoscopy earlier this year with one small polyp and 5 year f/u was recommended.  Polyp was early tubular adenoma.  She is not taking any ibuprofen.        Procedure:  US ABDOMEN COMPLETE  Ordering History:  Abdominal pain  Additional History: None  ?  Technique: Real-time ultrasound images of the abdomen were performed.  ?  Comparison: None  ?  Result:   ?  Pancreas: Visualized portions of the pancreatic body is normal. Portions of the head and tail   are obscured by overlying bowel gas  ?  Liver: Normal contour. Increased echogenicity of the liver with poor acoustic penetration   noted. No focal lesions are identified.  There is no intrahepatic biliary ductal dilatation.  Measurements:  Portal Vein: 0.9 cm, with hepatopetal flow  CBD: 3.8 mm  ?  Gallbladder: No shadowing stones, gallbladder wall thickening or pericholecystic fluid.   Sonographic Eulah PontMurphy sign is negative.  ?  Spleen: The spleen is homogeneous in echotexture measuring 10.4 cm.  ?  Right kidney: 10.1 cm in length.  There is normal parenchymal echogenicity.  No focal lesion is   identified.  There is no hydronephrosis.   ?

## 2016-08-16 NOTE — Progress Notes
?   Colon Cancer Screening  01/26/2021   ? Lipid Profile  06/12/2021   ? USPSTF HIV Risk Assessment  Completed   ? USPSTF Hepatitis C Screening  Completed   ? Influenza Vaccine  Completed

## 2016-08-16 NOTE — Progress Notes
?   DILATION AND CURETTAGE OF UTERUS      Surgery Description: Dilation And Curettage;  Problem Comments: for heavy bleeding after second delivery (Created by Conversion)   ? HYSTERECTOMY     ? NASAL SEPTUM SURGERY      Surgery Description: Nasal Septal Deviation Repair;   (Created by Conversion)   ? OTHER SURGICAL HISTORY      Surgery Description: Laparoscopy With Total Hysterectomy;  Problem Comments: excision of endometriosis-12/16/12 (Created by Conversion)   ? SALPINGO-OOPHORECTOMY      Surgery Description: Salpingo-oophorectomy Left Side;  Problem Comments: 12/16/12 (Created by Conversion)   ? TUBAL LIGATION     ? TUBAL LIGATION      Surgery Description: Tubal Ligation;   (Created by Conversion)     Family History   Problem Relation Age of Onset   ? High Blood Pressure Mother    ? High Blood Pressure Father    ? Cancer Maternal Uncle    ? Heart Attack Paternal Uncle    ? Heart Attack Paternal Grandfather    ? Breast Cancer Neg Hx      Social History     Social History   ? Marital status: Married     Spouse name: N/A   ? Number of children: N/A   ? Years of education: N/A     Occupational History   ? Not on file.     Social History Main Topics   ? Smoking status: Former Smoker   ? Smokeless tobacco: Never Used   ? Alcohol use No      Comment: very rare   ? Drug use: No   ? Sexual activity: Yes     Partners: Male     Other Topics Concern   ? Not on file     Social History Narrative     Current Outpatient Prescriptions on File Prior to Visit   Medication Sig   ? aspirin 81 MG Tablet Take by mouth daily.   ? cholecalciferol (VITAMIN D-3) 1000 UNITS Tablet Take 1,000 Units by mouth daily.   ? cyanocobalamin (VITAMIN B-12) 50 MCG Tablet Take by mouth daily. Pt does not know dose.   ? estradiol (ESTRACE) 0.1 MG/GM Cream Apply dime sized amount to vagina and urethra twice weekly at bedtime   ? levothyroxine (SYNTHROID, LEVOTHROID) 100 MCG Tablet Take 1 tablet by mouth daily.

## 2016-08-16 NOTE — Progress Notes
?   ondansetron (ZOFRAN) 4 MG PO Tablet Take 1 tablet by mouth daily as needed.   ? pantoprazole (PROTONIX) 40 MG PO Tablet Delayed Release Take 1 tablet by mouth daily.   ? promethazine (PHENERGAN) 25 MG PO Tablet Take 1 tablet by mouth every 8 hours as needed for nausea.   ? sucralfate (CARAFATE) 1 G PO Tablet Take 1 tablet by mouth 4 times daily.     No current facility-administered medications on file prior to visit.      Allergies   Allergen Reactions   ? Shellfish Allergy Itching and Swelling   ? Azithromycin Rash     Takes benadryl with this medication.   ? Erythromycin Rash     Takes benadryl with this medication.         Review of Systems  Review of Systems    CONSTITUTIONAL: Appetite good, no fevers, fatigue or weight loss, no headache  CV: No chest pain, palpitations, PND or peripheral edema  RESPIRATORY: No cough, shortness of breath, wheezing or dyspnea  GI: + nausea no vomiting, change in bowel habits, blood in stool, dysphagia, heartburn  + abdominal pain  GU: No dysuria, urgency or incontinence  NEURO: No dizziness, numbness, MS changes, motor weakness, or syncope      Objective:        VITAL SIGNS (all recorded)      Clinic Vitals       08/15/16 0852             Amb Encounter Vitals    Weight 102.6 kg (226 lb 3.2 oz)    -AW at 08/15/16 0855       Height 1.68 m (5' 6.13")    -AW at 08/15/16 0855       BMI (Calculated) 36.45    -AW at 08/15/16 0855       BSA (Calculated - sq m) 2.19    -AW at 08/15/16 0855       BP 124/83    -AW at 08/15/16 0855       BP Location Right lower arm    -AW at 08/15/16 0855       Position Sitting    -AW at 08/15/16 0855       Pulse 71    -AW at 08/15/16 0855       Pulse Source Brachial    -AW at 08/15/16 0855       Pulse Quality Normal    -AW at 08/15/16 0855       Resp 16    -AW at 08/15/16 0855       Respiration Quality Normal    -AW at 08/15/16 0855       Temp 36.4 ?C (97.5 ?F)    -AW at 08/15/16 0855       Temperature Source Oral    -AW at 08/15/16 [id number]

## 2016-08-16 NOTE — Progress Notes
O2 Saturation 98 %    -AW at 08/15/16 0855       FiO2 Source RA    -AW at 08/15/16 0855       Pain Score Zero    -AW at 08/15/16 0855       Last Menstrual Period --   Hysterectomy     -AW at 08/15/16 [id number]       Education/Communication Barriers?    Learning/Communication Barriers? No    -AW at 08/15/16 [id number]       Fall Risk Assessment    Had recent fall / Last 6 months? No recent fall    -AW at 08/15/16 [id number]       Does patient have a fear of falling? No    -AW at 08/15/16 [id number]         User Key  (r) = Recorded By, (t) = Taken By, (c) = Cosigned By    [facility] Name Effective Dates    AW [place], [place], [place] 12/07/15 -         Physical Exam   GENERAL: Vitals reviewed and patient is in NAD  HEENT: Neck supple. No lymphadenopathy.   LUNGS: Clear to auscultation bilaterally, no wheezes with forced expiration, respirations unlabored.  HEART: Regular rate and rhythm,  no ectopic beats or murmur  ABDOMEN: +BS mild/mod epigastric ttp, no r/r/g no organomegaly, no palpable masses or lesions.   EXTREMITIES: No edema.    SKIN: No rashes, no evidence of skin breakdown,    NEURO: Gait normal.   MENTAL STATUS: Alert, normal MS. Answers all questions appropriately.      Assessment:       ICD-10-CM ICD-9-CM    1. Epigastric abdominal pain R10.13 789.06    2. BMI 36.0-36.9,adult Z68.36 V85.36    3. Abnormal urinalysis R82.90 791.9 Urinalysis W/Microscopy          Plan:   1.  Abd pain (epigastric)  Suspect gastritis  Await eval with GI today  Consider egd  If EGD does not offer clear etiology consider CT Scan or HIDA Scan  Doing well on protonix  Work on small frequent meals  F/u in 6 weeks  Sooner if any issues  Labs reviewed amylase and lipase wnl  US showed fatty liver recheck in 1 year  Work on diet and exercise   Recommend miralax for constipation  No nsaids if possible   Patient given precautions that illnesses may evolve and that they should seek immediate medical treatment if they have:  Worsening fever

## 2016-08-16 NOTE — Progress Notes
Lymphocytes Absolute Latest Units: x10E3/uL 2.33  3.12   Monocytes Absolute Latest Units: x10E3/uL 0.49  0.57   Eosinophils Absolute Latest Units: x10E3/uL 0.06  0.08   Basophils Absolute Latest Units: x10E3/uL 0.06  0.07   Absolute NRBC Count Unknown 0.00  0.00   CBC AND DIFFERENTIAL Unknown Rpt (A)  Rpt (A)   Color -Ur Latest Ref Range: Geographical information systems officerAmber    Yellow   Clarity, UA Latest Ref Range: Hazy    Hazy   PH, URINE Latest Ref Range: 4.5 - 8.0    5.0   Specific Gravity, Urine Latest Ref Range: 1.003 - 1.030    1.020   Bilirubin -Ur Latest Ref Range: Negative    Negative   Blood -Ur Latest Ref Range: Negative    Moderate (A)   Glucose -Ur Latest Ref Range: Negative mg/dL   Negative   Nitrite -Ur Latest Ref Range: Negative    Negative   Protein-UA Latest Ref Range: Negative mg/dL   Negative   WBC -Ur Latest Ref Range: 0 - 5 /HPF   3   Leukocytes -Ur Latest Ref Range: Negative    Negative   Ketones UA Latest Ref Range: Negative mg/dL   Negative   RBC -Ur Latest Ref Range: 0 - 5 /HPF   3   Squam Epithel, UA Latest Ref Range: Not established /HPF   1   Bacteria -Ur Latest Ref Range: None seen /HPF   Rare (A)   Mucus -Ur Latest Ref Range: 2+ /LPF   Rare   Urobilinogen -Ur Latest Ref Range: Normal    Normal       Past Medical History:   Diagnosis Date   ? Dysmenorrhea    ? Feeling of incomplete bladder emptying    ? Fibromyalgia    ? H/O hyperkalemia    ? History of general anesthesia complication     VERY sensitive to anesthesia   ? Hypothyroid    ? Migraines     hemiplegic migraine   ? Partial tear of Achilles tendon     left   ? Pelvic pain     before hysterectomy   ? Thyroid disease      Past Surgical History:   Procedure Laterality Date   ? COLONOSCOPY     ? COLONOSCOPY W/ OR W/O BIOPSY N/A 01/27/2016    COLONOSCOPY W/ OR W/O BIOPSY [facility]el [facility]oldEid[facility] F[facility]   ? CYSTOSCOPY      Surgery Description: Cystoscopy (Diagnostic);  Problem Comments: 12/16/12 (Created by Conversion)

## 2016-08-25 DIAGNOSIS — R1013 Epigastric pain: Secondary | ICD-10-CM

## 2016-08-25 DIAGNOSIS — Z7982 Long term (current) use of aspirin: Secondary | ICD-10-CM

## 2016-08-25 DIAGNOSIS — K317 Polyp of stomach and duodenum: Secondary | ICD-10-CM

## 2016-08-25 DIAGNOSIS — Z90710 Acquired absence of both cervix and uterus: Secondary | ICD-10-CM

## 2016-08-25 DIAGNOSIS — R109 Unspecified abdominal pain: Secondary | ICD-10-CM

## 2016-08-25 DIAGNOSIS — R110 Nausea: Principal | ICD-10-CM

## 2016-08-25 DIAGNOSIS — Z79899 Other long term (current) drug therapy: Secondary | ICD-10-CM

## 2016-08-25 DIAGNOSIS — K2970 Gastritis, unspecified, without bleeding: Secondary | ICD-10-CM

## 2016-09-01 ENCOUNTER — Ambulatory Visit

## 2016-09-01 ENCOUNTER — Inpatient Hospital Stay

## 2016-09-01 DIAGNOSIS — K317 Polyp of stomach and duodenum: Secondary | ICD-10-CM

## 2016-09-01 DIAGNOSIS — Z79899 Other long term (current) drug therapy: Secondary | ICD-10-CM

## 2016-09-01 DIAGNOSIS — N946 Dysmenorrhea, unspecified: Secondary | ICD-10-CM

## 2016-09-01 DIAGNOSIS — K2970 Gastritis, unspecified, without bleeding: Secondary | ICD-10-CM

## 2016-09-01 DIAGNOSIS — Z9189 Other specified personal risk factors, not elsewhere classified: Secondary | ICD-10-CM

## 2016-09-01 DIAGNOSIS — G43909 Migraine, unspecified, not intractable, without status migrainosus: Secondary | ICD-10-CM

## 2016-09-01 DIAGNOSIS — Z90710 Acquired absence of both cervix and uterus: Secondary | ICD-10-CM

## 2016-09-01 DIAGNOSIS — M797 Fibromyalgia: Secondary | ICD-10-CM

## 2016-09-01 DIAGNOSIS — R1013 Epigastric pain: Principal | ICD-10-CM

## 2016-09-01 DIAGNOSIS — Z7982 Long term (current) use of aspirin: Secondary | ICD-10-CM

## 2016-09-01 DIAGNOSIS — S86019A Strain of unspecified Achilles tendon, initial encounter: Secondary | ICD-10-CM

## 2016-09-01 DIAGNOSIS — E079 Disorder of thyroid, unspecified: Principal | ICD-10-CM

## 2016-09-01 DIAGNOSIS — R3914 Feeling of incomplete bladder emptying: Secondary | ICD-10-CM

## 2016-09-01 DIAGNOSIS — R102 Pelvic and perineal pain: Secondary | ICD-10-CM

## 2016-09-01 DIAGNOSIS — Z8639 Personal history of other endocrine, nutritional and metabolic disease: Secondary | ICD-10-CM

## 2016-09-01 DIAGNOSIS — E039 Hypothyroidism, unspecified: Secondary | ICD-10-CM

## 2016-09-01 MED ORDER — LACTATED RINGERS IV SOLN
5 mL/h | INTRAVENOUS | Status: DC
Start: 2016-09-01 — End: 2016-09-02

## 2016-09-01 MED ORDER — LIDOCAINE HCL 1 % IJ SOLN
Status: DC | PRN
Start: 2016-09-01 — End: 2016-09-02

## 2016-09-01 MED ORDER — PROPOFOL 10 MG/ML IV EMUL CUSTOM SH
Status: DC | PRN
Start: 2016-09-01 — End: 2016-09-02

## 2016-09-01 MED ORDER — ONDANSETRON HCL 4 MG/2ML IJ SOLN
4 mg | Freq: Once | INTRAVENOUS | Status: DC | PRN
Start: 2016-09-01 — End: 2016-09-02

## 2016-09-01 MED ORDER — HALOPERIDOL LACTATE 5 MG/ML IJ SOLN
.5 mg | Freq: Once | INTRAVENOUS | Status: DC | PRN
Start: 2016-09-01 — End: 2016-09-02

## 2016-09-01 MED ORDER — PROMETHAZINE HCL 25 MG/ML IJ SOLN
6.25 mg | INTRAVENOUS | Status: DC | PRN
Start: 2016-09-01 — End: 2016-09-02

## 2016-09-01 MED ORDER — ONDANSETRON HCL 4 MG/2ML IJ SOLN
Status: DC | PRN
Start: 2016-09-01 — End: 2016-09-02

## 2016-09-01 MED ORDER — NALBUPHINE HCL 10 MG/ML IJ SOLN
2.5 mg | Freq: Once | INTRAVENOUS | Status: DC | PRN
Start: 2016-09-01 — End: 2016-09-02

## 2016-09-01 NOTE — Anesthesia Preprocedure Evaluation
GCS: Patients total GCS score is 15, with 4 for eye response, with 5 for verbal response, and with 6 for motor response.I have performed a pre-anesthesia examination, evaluation, and prescribed the anesthesia plan.

## 2016-09-01 NOTE — H&P
Department of Medicine  Division of Gastroenterology, Hepatology & Nutrition    Procedure Planned: Procedure(s):  EGD W/ BIOPSY (N/A)  Reason for Procedure: Nausea [R11.0]  Abdominal pain, unspecified abdominal location [R10.9]  Pre-Op Diagnosis Codes:     * Nausea [R11.0]     * Abdominal pain, unspecified abdominal location [R10.9]    HPI: 50 year old female with epigastric abdominal pain. No dysphagia, no heartburn. Has fibromyalgia.    History:   Past Medical History:   Diagnosis Date   ? Dysmenorrhea    ? Feeling of incomplete bladder emptying    ? Fibromyalgia    ? H/O hyperkalemia    ? History of general anesthesia complication     VERY sensitive to anesthesia   ? Hypothyroid    ? Migraines     hemiplegic migraine   ? Partial tear of Achilles tendon     left   ? Pelvic pain     before hysterectomy   ? Thyroid disease      Past Surgical History:   Procedure Laterality Date   ? COLONOSCOPY     ? COLONOSCOPY W/ OR W/O BIOPSY N/A 01/27/2016    COLONOSCOPY W/ OR W/O BIOPSY performed [facility] Noel [facility]oldEid, Amalie F[facility]   ? CYSTOSCOPY      Surgery Description: Cystoscopy (Diagnostic);  Problem Comments: 12/16/12 (Created by Conversion)   ? DILATION AND CURETTAGE OF UTERUS      Surgery Description: Dilation And Curettage;  Problem Comments: for heavy bleeding after second delivery (Created by Conversion)   ? HYSTERECTOMY     ? NASAL SEPTUM SURGERY      Surgery Description: Nasal Septal Deviation Repair;   (Created by Conversion)   ? OTHER SURGICAL HISTORY      Surgery Description: Laparoscopy With Total Hysterectomy;  Problem Comments: excision of endometriosis-12/16/12 (Created by Conversion)   ? SALPINGO-OOPHORECTOMY      Surgery Description: Salpingo-oophorectomy Left Side;  Problem Comments: 12/16/12 (Created by Conversion)   ? TUBAL LIGATION     ? TUBAL LIGATION      Surgery Description: Tubal Ligation;   (Created by Conversion)        I have reviewed the past medical and surgical, history.    Home Medications:

## 2016-09-01 NOTE — Anesthesia Postprocedure Evaluation
Post Anesthesia Eval    Respiratory function appropriate( normal respiratory rate and oxygen saturation, airway patent )  Cardiovascular function appropriate( normal heart rate and blood pressure )  Mental status appropriate  Patient normothermic  Pain well controlled  No uncontrolled nausea and vomiting  Patient appropriately hydrated  No apparent anesthesia complications  Patient tolerated procedure well

## 2016-09-01 NOTE — H&P
Prescriptions Prior to Admission   Medication Sig   ? cholecalciferol (VITAMIN D-3) 1000 UNITS Tablet Take 1,000 Units by mouth daily.   ? cyanocobalamin (VITAMIN B-12) 50 MCG Tablet Take by mouth daily. Pt does not know dose.   ? levothyroxine (SYNTHROID, LEVOTHROID) 100 MCG Tablet Take 1 tablet by mouth daily.   ? ondansetron (ZOFRAN) 4 MG PO Tablet Take 1 tablet by mouth daily as needed.   ? pantoprazole (PROTONIX) 40 MG PO Tablet Delayed Release Take 1 tablet by mouth daily.   ? sucralfate (CARAFATE) 1 G PO Tablet Take 1 tablet by mouth 4 times daily.   ? aspirin 81 MG Tablet Take by mouth daily.   ? estradiol (ESTRACE) 0.1 MG/GM Cream Apply dime sized amount to vagina and urethra twice weekly at bedtime   ? promethazine (PHENERGAN) 25 MG PO Tablet Take 1 tablet by mouth every 8 hours as needed for nausea.       Allergies:  is allergic to shellfish allergy; azithromycin; and erythromycin.    Mallampati: 1    NPO since: midnight    Physical Exam  General appearance: alert, cooperative, no distress, appears stated age  Lungs: clear to auscultation bilaterally  Heart: regular rate and rhythm, S1, S2 normal, no murmur, click, rub or gallop  Abdomen: soft, non-tender; bowel sounds normal; no masses, no organomegaly    ASA:  Class II ? patient with mild systemic disease    Previous Anesthesia: General    Problems/Reaction: none    Any Contraindications for Sedation?: no    Anesthetic/Sedation Plan: General/MAC Anesthesia requested    Options and risks discussed with Patient    Sedation consent signed and in Medical Record: yes    In light of the above evaluation, I believe this patient is an acceptable candidate for the procedure planned as outlined above.    Moustapha Dimachk  09/01/2016 12:02 PM

## 2016-09-01 NOTE — Anesthesia Preprocedure Evaluation
Department of Anesthesiology  Pre-operative Evaluation Record    Patient Name: Angela Andersen                  Sex: female                  Age: 50 y.o.                  MRN: [id number]     Allergies:   Shellfish allergy; Azithromycin; and Erythromycin    Current Med List   Medication Sig   ? cholecalciferol (VITAMIN D-3) 1000 UNITS Tablet Take 1,000 Units by mouth daily.   ? cyanocobalamin (VITAMIN B-12) 50 MCG Tablet Take by mouth daily. Pt does not know dose.   ? levothyroxine (SYNTHROID, LEVOTHROID) 100 MCG Tablet Take 1 tablet by mouth daily.   ? ondansetron (ZOFRAN) 4 MG PO Tablet Take 1 tablet by mouth daily as needed.   ? pantoprazole (PROTONIX) 40 MG PO Tablet Delayed Release Take 1 tablet by mouth daily.   ? sucralfate (CARAFATE) 1 G PO Tablet Take 1 tablet by mouth 4 times daily.       Prior Surgeries:     Past Surgical History:   Procedure Laterality Date   ? COLONOSCOPY     ? COLONOSCOPY W/ OR W/O BIOPSY N/A 01/27/2016    COLONOSCOPY W/ OR W/O BIOPSY [facility] Noel [facility]oldEid, [facility]al[facility] F[facility]   ? CYSTOSCOPY      Surgery Description: Cystoscopy (Diagnostic);  Problem Comments: 12/16/12 (Created by Conversion)   ? DILATION AND CURETTAGE OF UTERUS      Surgery Description: Dilation And Curettage;  Problem Comments: for heavy bleeding after second delivery (Created by Conversion)   ? HYSTERECTOMY     ? NASAL SEPTUM SURGERY      Surgery Description: Nasal Septal Deviation Repair;   (Created by Conversion)   ? OTHER SURGICAL HISTORY      Surgery Description: Laparoscopy With Total Hysterectomy;  Problem Comments: excision of endometriosis-12/16/12 (Created by Conversion)   ? SALPINGO-OOPHORECTOMY      Surgery Description: Salpingo-oophorectomy Left Side;  Problem Comments: 12/16/12 (Created by Conversion)   ? TUBAL LIGATION     ? TUBAL LIGATION      Surgery Description: Tubal Ligation;   (Created by Conversion)       Social   ETOH:       Alcohol Use   ? 5.0 oz/week   ? 1 Glasses of wine per week

## 2016-09-01 NOTE — H&P
Prescriptions Prior to Admission   Medication Sig   ? cholecalciferol (VITAMIN D-3) 1000 UNITS Tablet Take 1,000 Units by mouth daily.   ? cyanocobalamin (VITAMIN B-12) 50 MCG Tablet Take by mouth daily. Pt does not know dose.   ? levothyroxine (SYNTHROID, LEVOTHROID) 100 MCG Tablet Take 1 tablet by mouth daily.   ? ondansetron (ZOFRAN) 4 MG PO Tablet Take 1 tablet by mouth daily as needed.   ? pantoprazole (PROTONIX) 40 MG PO Tablet Delayed Release Take 1 tablet by mouth daily.   ? sucralfate (CARAFATE) 1 G PO Tablet Take 1 tablet by mouth 4 times daily.   ? aspirin 81 MG Tablet Take by mouth daily.   ? estradiol (ESTRACE) 0.1 MG/GM Cream Apply dime sized amount to vagina and urethra twice weekly at bedtime   ? promethazine (PHENERGAN) 25 MG PO Tablet Take 1 tablet by mouth every 8 hours as needed for nausea.       Allergies:  is allergic to shellfish allergy; azithromycin; and erythromycin.    Mallampati: 1    NPO since: midnight    Physical Exam  General appearance: alert, cooperative, no distress, appears stated age  Lungs: clear to auscultation bilaterally  Heart: regular rate and rhythm, S1, S2 normal, no murmur, click, rub or gallop  Abdomen: soft, non-tender; bowel sounds normal; no masses, no organomegaly    ASA:  Class II ? patient with mild systemic disease    Previous Anesthesia: General    Problems/Reaction: none    Any Contraindications for Sedation?: no    Anesthetic/Sedation Plan: General/MAC Anesthesia requested    Options and risks discussed with Patient    Sedation consent signed and in MEDICAL RECORD NUMBERyes    In light of the above evaluation, I believe this patient is an acceptable candidate for the procedure planned as outlined above.    Moustapha Dimachk  09/01/2016 12:02 PM

## 2016-09-01 NOTE — Anesthesia Preprocedure Evaluation
Comment: very rarely once a year     Tobacco:   History   Smoking Status   ? Former Smoker   Smokeless Tobacco   ? Never Used     Drugs:   History   Drug Use No       Evaluation:   ROS / Med Hx:  Negative for review of systems and medical history except otherwise noted below:  Outstanding issue identified States "sensitive to anesthesia" not PONV    HPI: Upper endoscopy for nonspecific abdominal pain x 3748m, h/o fibromyalgia, pelvic pain, migraine, hypothyroid   Anesthetic Complications: Negative for anesthesia history ROS except otherwise noted. "Very sensitive to anesthesia " per pt.   Pulmonary: Negative for pulmonary ROS except otherwise noted.   Neuro/Psych: Negative for neuro/psych ROS except otherwise noted. 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 The headaches aremigraine headaches.  The patient has a psychiatric history of :   Renal: Feeling of incomplete bladder emptying   Endo/Other: Negative for endo/other ROS except otherwise noted anemia (remote history) and herbal medication used. The patient has a history of Fibromyalgia. The patient has a history of thyroid problem. The type is hypothyroidism.     Physical Exam:  Airway: Mallampati score of II.   Pulmonary:  On pulmonary examination breath sounds clear to auscultation.   Cardiovascular: On cardiovascular examination regular rate and rhythm.     Anesthesia Plan:  Patient has an ASA score of 2 and patient is NPO. Plan for anesthesia technique is general.  With an intravenous type of induction. Anesthetic plan and risks has been discussed with the patient. The vascular access plan include the use of peripheral. The following pulse oximetry, body tempature, ETC02, NIBP and 6-lead EKG will be used for intraoperative monitoring.  Anesthesia Plan Comments: TIVA    I have performed a pre-anesthesia examination, evaluation, and prescribed the anesthesia plan.       Outstanding issue identified States "sensitive to anesthesia" not PONV

## 2016-09-01 NOTE — H&P
Department of Medicine  Division of Gastroenterology, Hepatology & Nutrition    Procedure Planned: Procedure(s):  EGD W/ BIOPSY (N/A)  Reason for Procedure: Nausea [R11.0]  Abdominal pain, unspecified abdominal location [R10.9]  Pre-Op Diagnosis Codes:     * Nausea [R11.0]     * Abdominal pain, unspecified abdominal location [R10.9]    HPI: 50 year old female with epigastric abdominal pain. No dysphagia, no heartburn. Has fibromyalgia.    History:   Past Medical History:   Diagnosis Date   ? Dysmenorrhea    ? Feeling of incomplete bladder emptying    ? Fibromyalgia    ? H/O hyperkalemia    ? History of general anesthesia complication     VERY sensitive to anesthesia   ? Hypothyroid    ? Migraines     hemiplegic migraine   ? Partial tear of Achilles tendon     left   ? Pelvic pain     before hysterectomy   ? Thyroid disease      Past Surgical History:   Procedure Laterality Date   ? COLONOSCOPY     ? COLONOSCOPY W/ OR W/O BIOPSY N/A 01/27/2016    COLONOSCOPY W/ OR W/O BIOPSY performed by Eid, Amalie F, MD at [facility]   ? CYSTOSCOPY      Surgery Description: Cystoscopy (Diagnostic);  Problem Comments: 12/16/12 (Created by Conversion)   ? DILATION AND CURETTAGE OF UTERUS      Surgery Description: Dilation And Curettage;  Problem Comments: for heavy bleeding after second delivery (Created by Conversion)   ? HYSTERECTOMY     ? NASAL SEPTUM SURGERY      Surgery Description: Nasal Septal Deviation Repair;   (Created by Conversion)   ? OTHER SURGICAL HISTORY      Surgery Description: Laparoscopy With Total Hysterectomy;  Problem Comments: excision of endometriosis-12/16/12 (Created by Conversion)   ? SALPINGO-OOPHORECTOMY      Surgery Description: Salpingo-oophorectomy Left Side;  Problem Comments: 12/16/12 (Created by Conversion)   ? TUBAL LIGATION     ? TUBAL LIGATION      Surgery Description: Tubal Ligation;   (Created by Conversion)        I have reviewed the past medical and surgical, history.    Home Medications:

## 2016-09-01 NOTE — Anesthesia Preprocedure Evaluation
HPI: Upper endoscopy for nonspecific abdominal pain x 7844m, h/o fibromyalgia, pelvic pain, migraine, hypothyroid   Anesthetic Complications: Negative for anesthesia history ROS except otherwise noted. "Very sensitive to anesthesia " per pt.   Pulmonary: Negative for pulmonary ROS except otherwise noted.   Neuro/Psych: Negative for neuro/psych ROS except otherwise noted. 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 The headaches aremigraine headaches.  The patient has a psychiatric history of :   Renal: Feeling of incomplete bladder emptying   Endo/Other: Negative for endo/other ROS except otherwise noted anemia (remote history) and herbal medication used. The patient has a history of Fibromyalgia. The patient has a history of thyroid problem. The type is hypothyroidism.     Airway: Physical exam of airway reveals, mouth opening of >3 FB with a TM distance of  > 6 cm with full neck ROM, Mallampati score of II.   Pulmonary:  On pulmonary examination breath sounds clear to auscultation.   Cardiovascular: On cardiovascular examination cardiovascular exam normal.   GCS: Patients total GCS score is 15, with 4 for eye response, with 5 for verbal response, and with 6 for motor response.  Obesity: obesity.      Patient has an ASA score of 2 and has been NPO for > 8 hours. Plan for anesthesia technique is general.  With an intravenous type of induction. Anesthetic plan and risks has been discussed with the patient. The vascular access plan include the use of peripheral. The following pulse oximetry, body tempature, ETC02, NIBP and 6-lead EKG will be used for intraoperative monitoring.     Anesthesia Plan Comments: TIVA    Airway: Physical exam of airway reveals, mouth opening of  >3 FB, with a TM distance of  > 6 cm, with full neck ROM, Mallampati score II.       Pulmonary: On pulmonary examination breath sounds clear to auscultation.   Cardiovascular: Negative for cardio exam except otherwise noted.

## 2016-09-05 DIAGNOSIS — G43909 Migraine, unspecified, not intractable, without status migrainosus: Secondary | ICD-10-CM

## 2016-09-05 DIAGNOSIS — R3914 Feeling of incomplete bladder emptying: Secondary | ICD-10-CM

## 2016-09-05 DIAGNOSIS — E039 Hypothyroidism, unspecified: Secondary | ICD-10-CM

## 2016-09-05 DIAGNOSIS — M797 Fibromyalgia: Secondary | ICD-10-CM

## 2016-09-05 DIAGNOSIS — N946 Dysmenorrhea, unspecified: Secondary | ICD-10-CM

## 2016-09-05 DIAGNOSIS — Z8639 Personal history of other endocrine, nutritional and metabolic disease: Secondary | ICD-10-CM

## 2016-09-05 DIAGNOSIS — Z9189 Other specified personal risk factors, not elsewhere classified: Secondary | ICD-10-CM

## 2016-09-05 DIAGNOSIS — E079 Disorder of thyroid, unspecified: Principal | ICD-10-CM

## 2016-09-05 DIAGNOSIS — S86019A Strain of unspecified Achilles tendon, initial encounter: Secondary | ICD-10-CM

## 2016-09-05 DIAGNOSIS — R102 Pelvic and perineal pain: Secondary | ICD-10-CM

## 2016-09-19 ENCOUNTER — Ambulatory Visit: Attending: Family Medicine | Primary: Family Medicine

## 2016-09-19 ENCOUNTER — Ambulatory Visit: Attending: Family | Primary: Family Medicine

## 2016-09-19 DIAGNOSIS — R3914 Feeling of incomplete bladder emptying: Secondary | ICD-10-CM

## 2016-09-19 DIAGNOSIS — Z8639 Personal history of other endocrine, nutritional and metabolic disease: Secondary | ICD-10-CM

## 2016-09-19 DIAGNOSIS — K317 Polyp of stomach and duodenum: Secondary | ICD-10-CM

## 2016-09-19 DIAGNOSIS — R102 Pelvic and perineal pain: Secondary | ICD-10-CM

## 2016-09-19 DIAGNOSIS — N946 Dysmenorrhea, unspecified: Secondary | ICD-10-CM

## 2016-09-19 DIAGNOSIS — S86019A Strain of unspecified Achilles tendon, initial encounter: Secondary | ICD-10-CM

## 2016-09-19 DIAGNOSIS — Z9189 Other specified personal risk factors, not elsewhere classified: Secondary | ICD-10-CM

## 2016-09-19 DIAGNOSIS — E079 Disorder of thyroid, unspecified: Principal | ICD-10-CM

## 2016-09-19 DIAGNOSIS — M797 Fibromyalgia: Secondary | ICD-10-CM

## 2016-09-19 DIAGNOSIS — E039 Hypothyroidism, unspecified: Secondary | ICD-10-CM

## 2016-09-19 DIAGNOSIS — R8290 Unspecified abnormal findings in urine: Secondary | ICD-10-CM

## 2016-09-19 DIAGNOSIS — Z9889 Other specified postprocedural states: Principal | ICD-10-CM

## 2016-09-19 DIAGNOSIS — Z6836 Body mass index (BMI) 36.0-36.9, adult: Secondary | ICD-10-CM

## 2016-09-19 DIAGNOSIS — G43909 Migraine, unspecified, not intractable, without status migrainosus: Secondary | ICD-10-CM

## 2016-09-19 NOTE — Progress Notes
HEPATITIS C ANTIBODY Latest Ref Range: Negative S/CO Negative         has a past medical history of Dysmenorrhea; Feeling of incomplete bladder emptying; Fibromyalgia; H/O hyperkalemia; History of general anesthesia complication; Hypothyroid; Migraines; Partial tear of Achilles tendon; Pelvic pain; and Thyroid disease. She also has no past medical history of BRCA1 positive; BRCA2 positive; Breast atypical hyperplasia; Breast discharge; Breast injury; Breast mass; Cancer; Colon cancer; Endometrial cancer; History of chemotherapy; History of radiation therapy; or Inverted nipple.     has a past surgical history that includes Tubal ligation; Colonoscopy; Nasal septum surgery; Tubal ligation; Dilation and curettage of uterus; other surgical history; Cystoscopy; Salpingo-oophorectomy; Hysterectomy; colonoscopy w/ or w/o biopsy (N/A, 01/27/2016); and egd w/ biopsy (N/A, 09/01/2016).    is allergic to shellfish allergy; azithromycin; and erythromycin.    family history includes Cancer in her maternal uncle; Heart Attack in her paternal grandfather and paternal uncle; High Blood Pressure in her father and mother. There is no history of Breast Cancer.     reports that she has quit smoking. She has never used smokeless tobacco. She reports that she drinks about 5.0 oz of alcohol per week  She reports that she does not use illicit drugs.    Current Outpatient Medications    Medication Sig Start Date End Date Taking? Authorizing Provider   aspirin 81 MG Tablet Take by mouth daily.   Yes Information, Historical   cholecalciferol (VITAMIN D-3) 1000 UNITS Tablet Take 1,000 Units by mouth daily.   Yes Information, Historical   cyanocobalamin (VITAMIN B-12) 50 MCG Tablet Take by mouth daily. Pt does not know dose.   Yes Information, Historical   estradiol (ESTRACE) 0.1 MG/GM Cream Apply dime sized amount to vagina and urethra twice weekly at bedtime 11/22/15  Yes Gabriel Rung, [place]

## 2016-09-19 NOTE — Progress Notes
Chief Complaint: Follow - Up: Review Test Results (Upper Endoscopy and Labs)      Referring Provider: No referring provider defined for this encounter.      History of Present Illness:  Patient is a 51 year old female here as a follow up for noncardiac chest pain, epigastric pain.  She had an EGD on 09/01/16, reviewed results.  She reports doing better now, symptoms are slowly improving.  She stopped protonix and carafate.  Takes zofran as needed, has only had to take in about 3 times total.       Severity: 0/10 now  Quality: ache, dull  Location: epigastric  Duration: weeks  Context: s/p EGD  Timing: improved  Associated symptoms: abdominal pain, nausea  Modifying factors: protonix, zofran, carafate   Laterality n/a  Acuity acute  Specificity in the location of the disease manifestations. abdomen  Timing of encounters. int  Manifestations and underlying conditions of the primary diagnosis.  History of use and dependence of tobacco, alcohol, and drugs     The patient's medical records and laboratory data was reviewed.      EGD 09/01/16-  Z-line regular, 38 cm from the incisors.  ?? ? ? ? ? ? ? ? ? ? - Gastritis.  ?? ? ? ? ? ? ? ? ? ? - Multiple gastric polyps. Biopsied.  Recommendation: ? ? ?- Discharge patient to home (with escort).  ?? ? ? ? ? ? ? ? ? ? - Resume previous diet.     Stomach, biopsy:  ?  Fundic gland polyp         U/S 08/07/16-  Impression:   No cholelithiasis or sonographic evidence of cholecystitis   Echogenic liver with poor acoustic penetration suggestive of underlying hepatic steatosis.        Colonoscopy 01/27/16-  BBPS=6  One 4 mm polyp in the descending colon. Resected and                       retrieved.  Recommendation:      - Await pathology results.                       - Repeat colonoscopy in 5 years for surveillance based                       on pathology results.     Descending colon, polyp, biopsy:      Early tubular adenoma             Ref. Range 10/21/2015 08:05

## 2016-09-19 NOTE — Patient Instructions
[employer]vier Interactive Patient Education ? 2017 [employer]

## 2016-09-19 NOTE — Progress Notes
Position Sitting    -AS at 09/19/16 0821       Pulse 83    -AS at 09/19/16 0821       Pulse Source Monitor    -AS at 09/19/16 0821       Resp 16    -AS at 09/19/16 0821       Respiration Quality Normal    -AS at 09/19/16 0821       Temp 36.3 ?C (97.3 ?F)    -AS at 09/19/16 0821       Pain Score Zero    -AS at 09/19/16 [id number]       Education/Communication Barriers?    Learning/Communication Barriers? No    -AS at 09/19/16 [id number]       Fall Risk Assessment    Had recent fall / Last 6 months? No recent fall    -AS at 09/19/16 [id number]       Does patient have a fear of falling? No    -AS at 09/19/16 [id number]         User Key  (r) = Recorded By, (t) = Taken By, (c) = Cosigned By    Initials Name Effective Dates    AS Enzo BiSmith, Audrey, MA 07/25/16 -         Body mass index is 36.54 kg/(m^2).    CONSTITUTION (General Appearance) - Healthy appearing at stated age.   HEAD AND NECK -  Pupils equal and reactive.  No icterus, pallor, congestion of nasal passages, lymphadenopathy.  SKIN - No rash or jaundice.  EYES - Pupils react normally and are non icteric.  ENT- Normal mucosal membranes and throat. normal dentition.  RESPIRATORY - Lungs clear to auscultation, normal respiratory effort.  CARDIOVASCULAR -  Normal heart sounds, normal abdominal aorta. Peripheral pulses present.   ABDOMEN -  Soft, nontender.  No organomegaly.  Normal bowel sounds. Non-distended.  EXTREMITIES -   No edema. Normal color. No varices  MUSCULOSKELETAL -Normal gait.  NEUROLOGICAL - No obvious deficits. Normal reflexes and sensation.  PSYCHIATRIC - Normal mood and oriented to person place and time, normal thought pattern, no flight of ideas or pressured speech.        ASSESSMENT    ICD-10-CM ICD-9-CM    1. S/P endoscopy Z98.890 V45.89    2. Gastric polyp K31.7 211.1          PLAN:   Avoid NSAIDs  Discussed lifestyle modifications for GERD  RTC PRN or sooner if symptoms worsen

## 2016-09-19 NOTE — Progress Notes
levothyroxine (SYNTHROID, LEVOTHROID) 100 MCG Tablet Take 1 tablet by mouth daily. 10/07/15  Yes Scuderi, Christopher B, DO   ondansetron (ZOFRAN) 4 MG PO Tablet Take 1 tablet by mouth daily as needed. 08/01/16  Yes Habell, Candace Cruiseavid F, PA-C   pantoprazole (PROTONIX) 40 MG PO Tablet Delayed Release Take 1 tablet by mouth daily. 08/07/16  Yes Rica KoyanagiShirley, Kaleena, PA-C   promethazine (PHENERGAN) 25 MG PO Tablet Take 1 tablet by mouth every 8 hours as needed for nausea. 08/07/16  Yes Rica KoyanagiShirley, Kaleena, PA-C   sucralfate (CARAFATE) 1 G PO Tablet Take 1 tablet by mouth 4 times daily. 08/01/16  Yes Habell, Candace Cruiseavid F, PA-C         Review of Systems  Constitution - patient denies fevers chills or weight loss.  Musculoskeletal - patient denies muscle pain and weakness and difficulty with gait  Hematological - patient denies easy bleeding and bruising.  Neurological - patient denies headache or loss of consciousness.  Allergic/immune - patient denies runny nose, dry eyes, skin itching and recurrent infections.   Eye - patient denies eye pain or redness.  Skin - patient denies itching and yellow color to skin.  Psychiatric - patient denies active depression and anxiety.  Cardiopulmonary -  Denies chest pain, shortness of breath, cough, and palpitations  Urinary -  Denies hematuria, dysuria, and decreased urination.  G I -  Denies nausea, vomiting, heartburn, dysphagia, odynophagia, abdominal pain, diarrhea, constipation, melena, hematochezia, and anorexia.    All other systems reviewed and negative.      Physical Exam:     VITAL SIGNS (all recorded)      Clinic Vitals       09/19/16 0819             Amb Encounter Vitals    Weight 102.7 kg (226 lb 6.4 oz)    -AS at 09/19/16 0821       Height 1.676 m (5\' 6" )    -AS at 09/19/16 0821       BMI (Calculated) 36.62    -AS at 09/19/16 0821       BSA (Calculated - sq m) 2.19    -AS at 09/19/16 0821       BP 108/77    -AS at 09/19/16 0821       BP Location Right upper arm    -AS at 09/19/16 [id number]

## 2016-09-19 NOTE — Patient Instructions
Abdominal Pain, Adult  Many things can cause belly (abdominal) pain. Most times, belly pain is not dangerous. Many cases of belly pain can be watched and treated at home. Sometimes belly pain is serious, though. Your doctor will try to find the cause of your belly pain.  Follow these instructions at home:  ? Take over-the-counter and prescription medicines only as told by your doctor. Do not take medicines that help you poop (laxatives) unless told to by your doctor.  ? Drink enough fluid to keep your pee (urine) clear or pale yellow.  ? Watch your belly pain for any changes.  ? Keep all follow-up visits as told by your doctor. This is important.  Contact a doctor if:  ? Your belly pain changes or gets worse.  ? You are not hungry, or you lose weight without trying.  ? You are having trouble pooping (constipated) or have watery poop (diarrhea) for more than 2?3 days.  ? You have pain when you pee or poop.  ? Your belly pain wakes you up at night.  ? Your pain gets worse with meals, after eating, or with certain foods.  ? You are throwing up and cannot keep anything down.  ? You have a fever.  Get help right away if:  ? Your pain does not go away as soon as your doctor says it should.  ? You cannot stop throwing up.  ? Your pain is only in areas of your belly, such as the right side or the left lower part of the belly.  ? You have bloody or black poop, or poop that looks like tar.  ? You have very bad pain, cramping, or bloating in your belly.  ? You have signs of not having enough fluid or water in your body (dehydration), such as:  ? Dark pee, very little pee, or no pee.  ? Cracked lips.  ? Dry mouth.  ? Sunken eyes.  ? Sleepiness.  ? Weakness.  This information is not intended to replace advice given to you by your health care provider. Make sure you discuss any questions you have with your health care provider.  Document Released: 02/07/2008 Document Revised: 03/10/2016 Document Reviewed: 02/02/2016

## 2016-09-20 NOTE — Progress Notes
mouth every 8 hours as needed for nausea.   ? [DISCONTINUED] sucralfate (CARAFATE) 1 G PO Tablet Take 1 tablet by mouth 4 times daily.     No current facility-administered medications on file prior to visit.      Allergies   Allergen Reactions   ? Shellfish Allergy Itching and Swelling   ? Azithromycin Rash     Takes benadryl with this medication.   ? Erythromycin Rash     Takes benadryl with this medication.         Review of Systems  Review of Systems    CONSTITUTIONAL: Appetite good, no fevers, fatigue or weight loss, no headache  CV: No chest pain, palpitations, PND or peripheral edema  RESPIRATORY: No cough, shortness of breath, wheezing or dyspnea  GI: +improved nausea no vomiting, change in bowel habits, blood in stool, dysphagia, improved heartburn  + improved abdominal pain  GU: No dysuria, urgency or incontinence  NEURO: No dizziness, numbness, MS changes, motor weakness, or syncope      Objective:        VITAL SIGNS (all recorded)      Clinic Vitals       09/19/16 0819 09/19/16 1044          Amb Encounter Vitals    Weight 102.7 kg (226 lb 6.4 oz)    -AS at 09/19/16 0821 102.1 kg (225 lb)    -MD at 09/19/16 1047      Height 1.676 m (5\' 6" )    -AS at 09/19/16 0821 1.676 m (5\' 6" )    -MD at 09/19/16 1047      BMI (Calculated) 36.62    -AS at 09/19/16 0821 36.39    -MD at 09/19/16 1047      BSA (Calculated - sq m) 2.19    -AS at 09/19/16 0821 2.18    -MD at 09/19/16 1047      BP 108/77    -AS at 09/19/16 0821 104/70    -MD at 09/19/16 1047      BP Location Right upper arm    -AS at 09/19/16 0821 Left lower arm    -MD at 09/19/16 1047      Position Sitting    -AS at 09/19/16 0821 Sitting    -MD at 09/19/16 1047      Pulse 83    -AS at 09/19/16 0821 86    -MD at 09/19/16 1047      Pulse Source Monitor    -AS at 09/19/16 0821       Resp 16    -AS at 09/19/16 0821 16    -MD at 09/19/16 1047      Respiration Quality Normal    -AS at 09/19/16 0821       Temp 36.3 ?C (97.3 ?F)

## 2016-09-20 NOTE — Progress Notes
Vomiting and inability to keep down liquids   Severe pain or worsening pain   Mental status changes  No improvement in 3-5 days   Worsening of symptoms   Any adverse affects they feel is due to any of their current medications       Consider HIDA scan if not improving  Pnt can call to let us know or email    Pnt is moving sometime this Spring.   She will let us know when and will see one more time prior to move       2. UA showed 3 RBC  Recheck lab UA today  If Positive consider further workup     3. BMI 36/ fatty liver  Angela Andersen's Estimated body mass index is 36.32 kg/(m^2) as calculated from the following:    Height as of this encounter: 1.676 m (5\' 6" ).    Weight as of this encounter: 102.1 kg (225 lb).    Good work on diet changes       No orders of the following type(s) were placed in this encounter: Medications.     Orders Placed This Encounter   Procedures   ? Urinalysis W/Microscopy       Health Maintenance was reviewed. The patient's HM Topic list was:                                            Health Maintenance   Topic Date Due   ? DTaP,Tdap,and Td Vaccines (1 - Tdap) 12/18/2016 (Originally 12/19/1984)   ? Pneumovax / Prevnar (1 of 1 - PPSV23) 12/18/2016 (Originally 12/19/1984)   ? Preventive Wellness Visit  02/08/2017   ? Breast Cancer Screening  03/23/2018   ? Colon Cancer Screening  01/26/2021   ? Lipid Profile  06/12/2021   ? Zoster Vaccine (1) 12/19/2023   ? USPSTF HIV Risk Assessment  Completed   ? USPSTF Hepatitis C Screening  Completed   ? Influenza Vaccine  Completed

## 2016-09-20 NOTE — Progress Notes
Surgery Description: Laparoscopy With Total Hysterectomy;  Problem Comments: excision of endometriosis-12/16/12 (Created by Conversion)   ? SALPINGO-OOPHORECTOMY      Surgery Description: Salpingo-oophorectomy Left Side;  Problem Comments: 12/16/12 (Created by Conversion)   ? TUBAL LIGATION     ? TUBAL LIGATION      Surgery Description: Tubal Ligation;   (Created by Conversion)     Family History   Problem Relation Age of Onset   ? High Blood Pressure Mother    ? High Blood Pressure Father    ? Cancer Maternal Uncle    ? Heart Attack Paternal Uncle    ? Heart Attack Paternal Grandfather    ? Breast Cancer Neg Hx      Social History     Social History   ? Marital status: Married     Spouse name: N/A   ? Number of children: N/A   ? Years of education: N/A     Occupational History   ? Not on file.     Social History Main Topics   ? Smoking status: Former Smoker   ? Smokeless tobacco: Never Used   ? Alcohol use 5.0 oz/week     1 Glasses of wine per week      Comment: very rarely once a year   ? Drug use: No   ? Sexual activity: Yes     Partners: Male     Other Topics Concern   ? Not on file     Social History Narrative     Current Outpatient Prescriptions on File Prior to Visit   Medication Sig   ? cholecalciferol (VITAMIN D-3) 1000 UNITS Tablet Take 1,000 Units by mouth daily.   ? cyanocobalamin (VITAMIN B-12) 50 MCG Tablet Take by mouth daily. Pt does not know dose.   ? levothyroxine (SYNTHROID, LEVOTHROID) 100 MCG Tablet Take 1 tablet by mouth daily.   ? ondansetron (ZOFRAN) 4 MG PO Tablet Take 1 tablet by mouth daily as needed.   ? [DISCONTINUED] aspirin 81 MG Tablet Take by mouth daily.   ? [DISCONTINUED] estradiol (ESTRACE) 0.1 MG/GM Cream Apply dime sized amount to vagina and urethra twice weekly at bedtime   ? [DISCONTINUED] pantoprazole (PROTONIX) 40 MG PO Tablet Delayed Release Take 1 tablet by mouth daily.   ? [DISCONTINUED] promethazine (PHENERGAN) 25 MG PO Tablet Take 1 tablet by

## 2016-09-20 NOTE — Progress Notes
Subjective:   Angela Andersen is a 51 y.o. female being seen today for Abdominal Pain (f/u abdominal pain)       HPI       Pnt is here for a f/u from GI.  She had an endcopy which showed multiple gastric polyps.  She has been improving.  She has been making diet changes. She feels like her symptoms are improving.  She has seen GI today and has been cleared to f/u prn. She is reading a lot on diet and has been making positive changes. She denies any blood or black in her stools. Stools are looser since she is eating more plant based.       No fevers. No chest pain.         Per GI Adelfa KohKelli Leis today  EGD 09/01/16-  Z-line regular, 38 cm from the incisors.  ?? ? ? ? ? ? ? ? ? ? - Gastritis.  ?? ? ? ? ? ? ? ? ? ? - Multiple gastric polyps. Biopsied.  Recommendation: ? ? ?- Discharge patient to home (with escort).  ?? ? ? ? ? ? ? ? ? ? - Resume previous diet.  ?  ?Stomach, biopsy:  ??  Fundic gland polyp  ?  ASSESSMENT  ? ? ICD-10-CM ICD-9-CM ?   1. S/P endoscopy Z98.890 V45.89 ?   2. Gastric polyp K31.7 211.1 ?   ?  ?  ?  PLAN:   Avoid NSAIDs  Discussed lifestyle modifications for GERD  RTC PRN or sooner if symptoms worsen           09/05/2016 ?2:38 PM - Ilda MoriMonteiro, Carmela B, MD   ?   Component Results    ? Component Observation   ? Clinical History 09/01/2016 ?1:44 PM   ? Abdominal pain.   ? Final Diagnosis 09/01/2016 ?1:44 PM   ? ?  Stomach, biopsy:  ?  Fundic gland polyp   ? Attestation Statement 09/01/2016 ?1:44 PM   ? I have personally reviewed the material on which this diagnosis was based and my diagnosis is as stated.   ? Gross Description 09/01/2016 ?1:44 PM   ? The specimen is received in a single container of formalin, labeled with the patient's name and unit number.  It is labeled "gastric polyp " and consists of three fragments of tan tissue, measuring approximately 0.2 x 0.2 x 0.2 cm each.  Submitted entirely in cassette A1.  ?  Jg/vh  ?   ? ?      ? Case Report 09/01/2016 ?1:44 PM

## 2016-09-20 NOTE — Progress Notes
-  AS at 09/19/16 0821 36.3 ?C (97.3 ?F)    -MD at 09/19/16 1047      Temperature Source  Oral    -MD at 09/19/16 1047      O2 Saturation  99 %    -MD at 09/19/16 1047      FiO2 Source  RA    -MD at 09/19/16 1047      Pain Score Zero    -AS at 09/19/16 [id number] Zero    -MD at 09/19/16 1047      Education/Communication Barriers?    Learning/Communication Barriers? No    -AS at 09/19/16 0821 No    -MD at 09/19/16 1047      Fall Risk Assessment    Had recent fall / Last 6 months? No recent fall    -AS at 09/19/16 [id number] No recent fall    -MD at 09/19/16 1047      Does patient have a fear of falling? No    -AS at 09/19/16 [id number] No    -MD at 09/19/16 1047        User Key  (r) = Recorded By, (t) = Taken By, (c) = Cosigned By    Initials Name Effective Dates    AS Enzo BiSmith, Audrey, MA 07/25/16 -     MD Kennis Carinaonovan, Mary A, MA 12/07/15 -         Physical Exam   GENERAL: Vitals reviewed and patient is in NAD  Pulse ox 99% on RA   HEENT: Neck supple. No lymphadenopathy.   LUNGS: Clear to auscultation bilaterally, no wheezes with forced expiration, respirations unlabored.  HEART: Regular rate and rhythm,  no ectopic beats or murmur  ABDOMEN: +BS  No ttp, no r/r/g no organomegaly, no palpable masses or lesions.   EXTREMITIES: No edema.    SKIN: No rashes, no evidence of skin breakdown,    NEURO: Gait normal.   MENTAL STATUS: Alert, normal MS. Answers all questions appropriately.      Assessment:       ICD-10-CM ICD-9-CM    1. Gastric polyp K31.7 211.1    2. BMI 36.0-36.9,adult Z68.36 V85.36    3. Urine abnormality R82.90 791.9 Urinalysis W/Microscopy          Plan:   1.  Abd pain (epigastric)  Improving with diet changes  She feels like she is moving in the right direction  GI consult reviewed  She is feeling 80% better  Off protonix will try every other day   Patient given precautions that illnesses may evolve and that they should seek immediate medical treatment if they have:  Worsening fever   Shortness of breath

## 2016-09-20 NOTE — Progress Notes
?   Surgical Pathology Report ? ? ? ? ? ? ? ? ? ? ? ? Case: [id number] ? ? ? ? ? ? ? ? ? ? ? ? ? ? ? ?   Authorizing Provider: ?Lenis NoonMalespin, Miguel, MD ? ? ? Collected: ? ? ? ? ? 09/01/2016 1344 ? ? ? ? ? ?   Ordering Location: ? ? [facility] ? ? ? Received: ? ? ? ? ? ?09/01/2016 1438 ? ? ? ? ? ?   ? ? ? ? ? ? ? ? ? ? ? ?[facility] ? ? ? ? ? ? ? ? ? ? ? ? ? ? ? ? ? ? ? ? ? ? ? ? ? ? ? ? ?   Pathologist: ? ? ? ? ? Ilda MoriMonteiro, Carmela B, MD ? ? ? ? ? ? ? ? ? ? ? ? ? ? ? ? ? ? ? ? ? ? ? ? ? ?   Specimen: ? ?GI Stomach, polypectomy ? 05, gastric polyp ? ? ? ? ? ? ? ? ? ? ? ? ? ? ? ? ? ? ? ? ?      ? Resident/Fellow 09/01/2016 ?1:44 PM   ? Annie MainSandra Siller MD (resident)  ?   ?   Scans on Order [id number]    ? ? Document on 09/05/2016 1438 by Ilda MoriMonteiro, Carmela B, MD   ?      ?       Past Medical History:   Diagnosis Date   ? Dysmenorrhea    ? Feeling of incomplete bladder emptying    ? Fibromyalgia    ? H/O hyperkalemia    ? History of general anesthesia complication     VERY sensitive to anesthesia   ? Hypothyroid    ? Migraines     hemiplegic migraine   ? Partial tear of Achilles tendon     left   ? Pelvic pain     before hysterectomy   ? Thyroid disease      Past Surgical History:   Procedure Laterality Date   ? COLONOSCOPY     ? COLONOSCOPY W/ OR W/O BIOPSY N/A 01/27/2016    COLONOSCOPY W/ OR W/O BIOPSY [facility] Noel [facility]oldEid, [facility] F[facility]   ? CYSTOSCOPY      Surgery Description: Cystoscopy (Diagnostic);  Problem Comments: 12/16/12 (Created by Conversion)   ? DILATION AND CURETTAGE OF UTERUS      Surgery Description: Dilation And Curettage;  Problem Comments: for heavy bleeding after second delivery (Created by Conversion)   ? EGD W/ BIOPSY N/A 09/01/2016    EGD W/ BIOPSY performed by Lenis NoonMalespin, Miguel, [facility]   ? HYSTERECTOMY     ? NASAL SEPTUM SURGERY      Surgery Description: Nasal Septal Deviation Repair;   (Created by Conversion)   ? OTHER SURGICAL HISTORY

## 2016-09-29 ENCOUNTER — Inpatient Hospital Stay: Admit: 2016-09-29 | Discharge: 2016-09-29

## 2016-09-29 ENCOUNTER — Emergency Department: Admit: 2016-09-29 | Discharge: 2016-09-29

## 2016-09-29 DIAGNOSIS — E039 Hypothyroidism, unspecified: Secondary | ICD-10-CM

## 2016-09-29 DIAGNOSIS — Z87891 Personal history of nicotine dependence: Secondary | ICD-10-CM

## 2016-09-29 DIAGNOSIS — S6992XS Unspecified injury of left wrist, hand and finger(s), sequela: Principal | ICD-10-CM

## 2016-09-29 DIAGNOSIS — Z8639 Personal history of other endocrine, nutritional and metabolic disease: Secondary | ICD-10-CM

## 2016-09-29 DIAGNOSIS — Z9189 Other specified personal risk factors, not elsewhere classified: Secondary | ICD-10-CM

## 2016-09-29 DIAGNOSIS — R3914 Feeling of incomplete bladder emptying: Secondary | ICD-10-CM

## 2016-09-29 DIAGNOSIS — Z79899 Other long term (current) drug therapy: Secondary | ICD-10-CM

## 2016-09-29 DIAGNOSIS — R102 Pelvic and perineal pain: Secondary | ICD-10-CM

## 2016-09-29 DIAGNOSIS — M797 Fibromyalgia: Secondary | ICD-10-CM

## 2016-09-29 DIAGNOSIS — E079 Disorder of thyroid, unspecified: Principal | ICD-10-CM

## 2016-09-29 DIAGNOSIS — S6982XA Other specified injuries of left wrist, hand and finger(s), initial encounter: Principal | ICD-10-CM

## 2016-09-29 DIAGNOSIS — N946 Dysmenorrhea, unspecified: Secondary | ICD-10-CM

## 2016-09-29 DIAGNOSIS — S63602A Unspecified sprain of left thumb, initial encounter: Secondary | ICD-10-CM

## 2016-09-29 DIAGNOSIS — G43909 Migraine, unspecified, not intractable, without status migrainosus: Secondary | ICD-10-CM

## 2016-09-29 DIAGNOSIS — S86019A Strain of unspecified Achilles tendon, initial encounter: Secondary | ICD-10-CM

## 2016-09-29 MED ORDER — KETOROLAC TROMETHAMINE 15 MG/ML IJ SOLN
15 mg | Freq: Once | INTRAMUSCULAR | Status: CP
Start: 2016-09-29 — End: ?

## 2016-09-29 NOTE — ED Provider Notes
Tenderness over entire thumb and 1st metacarpal ; no snuff box tenderness  Full ROM (flexion / extension / abduction / adduction) of wrist, MCP, PIP and DIP of digits 2-5 Lumbricals and interosseous intact.  UnAble to open/close fist due to not moving thumb much  intact abduction/adduction/opposition of thumb limited 2/2 pain, able to make "OK" sign with first two digits     Neurological: She is alert and oriented to person, place, and time. No cranial nerve deficit.   Skin: Skin is warm and dry. She is not diaphoretic.   Nursing note and vitals reviewed.      Differential DDx: fracture vs sprain vs dislocation vs others    Is this an Emergent Medical Condition? Yes - Severe Pain/Acute Onset of Symptons  409.901 FS  641.19 FS  627.732 (16) FS    ED Workup   Procedures    Labs:  - - No data to display      Imaging (Read by ED Provider):  Per Radiology: Xr Hand Left 3 Views    Result Date: 09/29/2016  XR HAND LEFT 3 VIEWS Indications:  Thumb pain Comparison:  None     Findings/Impression: Negative for fracture of the left thumb. 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 No erosions. Read By Alysia Penna- Paul Wasserman D.O.  Electronically Verified By Alysia Penna- Paul Wasserman D.O.  Released Date Time - 09/29/2016 10:17 AM  Resident -       EKG (Read by ED Provider):  not applicable    MDM  Number of Diagnoses or Management Options  Sprain of left thumb, unspecified site of finger, initial encounter:      Amount and/or Complexity of Data Reviewed  Tests in the radiology section of CPT?: ordered and reviewed  Discussion of test results with the performing providers: yes  Decide to obtain previous medical records or to obtain history from someone other than the patient: no  Obtain history from someone other than the patient: no  Review and summarize past medical records: no  Discuss the patient with other providers: no  Independent visualization of images, tracings, or specimens: no

## 2016-09-29 NOTE — ED Provider Notes
?   Partial tear of Achilles tendon     left   ? Pelvic pain     before hysterectomy   ? Thyroid disease        Past Surgical History:   Procedure Laterality Date   ? COLONOSCOPY     ? COLONOSCOPY W/ OR W/O BIOPSY N/A 01/27/2016    COLONOSCOPY W/ OR W/O BIOPSY performed [facility] Noel GeroldEid, Amalie F[facility]   ? CYSTOSCOPY      Surgery Description: Cystoscopy (Diagnostic);  Problem Comments: 12/16/12 (Created by Conversion)   ? DILATION AND CURETTAGE OF UTERUS      Surgery Description: Dilation And Curettage;  Problem Comments: for heavy bleeding after second delivery (Created by Conversion)   ? EGD W/ BIOPSY N/A 09/01/2016    EGD W/ BIOPSY performed by Lenis NoonMalespin, Miguel[facility]   ? HYSTERECTOMY     ? NASAL SEPTUM SURGERY      Surgery Description: Nasal Septal Deviation Repair;   (Created by Conversion)   ? OTHER SURGICAL HISTORY      Surgery Description: Laparoscopy With Total Hysterectomy;  Problem Comments: excision of endometriosis-12/16/12 (Created by Conversion)   ? SALPINGO-OOPHORECTOMY      Surgery Description: Salpingo-oophorectomy Left Side;  Problem Comments: 12/16/12 (Created by Conversion)   ? TUBAL LIGATION     ? TUBAL LIGATION      Surgery Description: Tubal Ligation;   (Created by Conversion)       Family History   Problem Relation Age of Onset   ? High Blood Pressure Mother    ? High Blood Pressure Father    ? Cancer Maternal Uncle    ? Heart Attack Paternal Uncle    ? Heart Attack Paternal Grandfather    ? Breast Cancer Neg Hx        Social History     Social History   ? Marital status: Married     Spouse name: N/A   ? Number of children: N/A   ? Years of education: N/A     Social History Main Topics   ? Smoking status: Former Smoker   ? Smokeless tobacco: Never Used   ? Alcohol use 5.0 oz/week     1 Glasses of wine per week      Comment: very rarely once a year   ? Drug use: No   ? Sexual activity: Yes     Partners: Male     Other Topics Concern   ? None     Social History Narrative

## 2016-09-29 NOTE — ED Provider Notes
UnAble to open/close fist due to not moving thumb much  intact abduction/adduction/opposition of thumb limited 2/2 pain, able to make "OK" sign with first two digits     Neurological: She is alert and oriented to person, place, and time. No cranial nerve deficit.   Skin: Skin is warm and dry. She is not diaphoretic.   Nursing note and vitals reviewed.      Differential DDx: fracture vs sprain vs dislocation vs others    Is this an Emergent Medical Condition? Yes - Severe Pain/Acute Onset of Symptons  409.901 FS  641.19 FS  627.732 (16) FS    ED Workup   Procedures    Labs:  - - No data to display      Imaging (Read by ED Provider):  {Imaging findings:[phone]}    EKG (Read by ED Provider):  {EKG [phone]}    MDM    ED Course & Re-Evaluation     ED Course   51 y/o F R handed p/w L thumb pain s/p fall onto hand and having thumb bend backwards. On exam, selling, sensation intact, limited ROM of thumb. Will get XR. Analgesia    Marinell BlightUSHAR GUPTA, MD 9:47 AM 09/29/2016    ED Disposition   ED Disposition: No ED Disposition Set      ED Clinical Impression   ED Clinical Impression:   No Clinical Impression Set      ED Patient Status   Patient Status:   {SH ED JX PATIENT [phone]}        ED Medical Evaluation Initiated   Medical Evaluation Initiated:   No MEI documented***

## 2016-09-29 NOTE — ED Provider Notes
ED Course & Re-Evaluation     ED Course   51 y/o F R handed p/w L thumb pain s/p fall onto hand and having thumb bend backwards. On exam, selling, sensation intact, limited ROM of thumb. Will get XR. Analgesia    Marinell BlightUSHAR GUPTA, MD 9:47 AM 09/29/2016    XR neg for fx or dislocation. Treat as sprain PCP f/u. Pt given return precautions, verbalized understanding, and is agreeable with plan.     Marinell BlightUSHAR GUPTA, MD 10:28 AM 09/29/2016      ED Disposition   ED Disposition: Discharge      ED Clinical Impression   ED Clinical Impression:   Sprain of left thumb, unspecified site of finger, initial encounter      ED Patient Status   Patient Status:   Good        ED Medical Evaluation Initiated   Medical Evaluation Initiated:   Yes, filed at 09/29/16 [id number]  by Marinell BlightGupta, Tushar, MD

## 2016-09-29 NOTE — ED Provider Notes
ED Medical Evaluation Initiated   Medical Evaluation Initiated:   No MEI documented***

## 2016-09-29 NOTE — ED Provider Notes
History   No chief complaint on file.      HPI    Allergies   Allergen Reactions   ? Shellfish Allergy Itching and Swelling   ? Azithromycin Rash     Takes benadryl with this medication.   ? Erythromycin Rash     Takes benadryl with this medication.       Patient's Medications   New Prescriptions    No medications on file   Previous Medications    CHOLECALCIFEROL (VITAMIN D-3) 1000 UNITS TABLET    Take 1,000 Units by mouth daily.    CYANOCOBALAMIN (VITAMIN B-12) 50 MCG TABLET    Take by mouth daily. Pt does not know dose.    LEVOTHYROXINE (SYNTHROID, LEVOTHROID) 100 MCG TABLET    Take 1 tablet by mouth daily.    ONDANSETRON (ZOFRAN) 4 MG PO TABLET    Take 1 tablet by mouth daily as needed.   Modified Medications    No medications on file   Discontinued Medications    No medications on file       Past Medical History:   Diagnosis Date   ? Dysmenorrhea    ? Feeling of incomplete bladder emptying    ? Fibromyalgia    ? H/O hyperkalemia    ? History of general anesthesia complication     VERY sensitive to anesthesia   ? Hypothyroid    ? Migraines     hemiplegic migraine   ? Partial tear of Achilles tendon     left   ? Pelvic pain     before hysterectomy   ? Thyroid disease        Past Surgical History:   Procedure Laterality Date   ? COLONOSCOPY     ? COLONOSCOPY W/ OR W/O BIOPSY N/A 01/27/2016    COLONOSCOPY W/ OR W/O BIOPSY performed by Noel GeroldEid, Amalie F[facility]   ? CYSTOSCOPY      Surgery Description: Cystoscopy (Diagnostic);  Problem Comments: 12/16/12 (Created by Conversion)   ? DILATION AND CURETTAGE OF UTERUS      Surgery Description: Dilation And Curettage;  Problem Comments: for heavy bleeding after second delivery (Created by Conversion)   ? EGD W/ BIOPSY N/A 09/01/2016    EGD W/ BIOPSY performed by Lenis NoonMalespin, Miguel[facility]   ? HYSTERECTOMY     ? NASAL SEPTUM SURGERY      Surgery Description: Nasal Septal Deviation Repair;   (Created by Conversion)   ? OTHER SURGICAL HISTORY

## 2016-09-29 NOTE — ED Provider Notes
Surgery Description: Laparoscopy With Total Hysterectomy;  Problem Comments: excision of endometriosis-12/16/12 (Created by Conversion)   ? SALPINGO-OOPHORECTOMY      Surgery Description: Salpingo-oophorectomy Left Side;  Problem Comments: 12/16/12 (Created by Conversion)   ? TUBAL LIGATION     ? TUBAL LIGATION      Surgery Description: Tubal Ligation;   (Created by Conversion)       Family History   Problem Relation Age of Onset   ? High Blood Pressure Mother    ? High Blood Pressure Father    ? Cancer Maternal Uncle    ? Heart Attack Paternal Uncle    ? Heart Attack Paternal Grandfather    ? Breast Cancer Neg Hx        Social History     Social History   ? Marital status: Married     Spouse name: N/A   ? Number of children: N/A   ? Years of education: N/A     Social History Main Topics   ? Smoking status: Former Smoker   ? Smokeless tobacco: Never Used   ? Alcohol use 5.0 oz/week     1 Glasses of wine per week      Comment: very rarely once a year   ? Drug use: No   ? Sexual activity: Yes     Partners: Male     Other Topics Concern   ? Not on file     Social History Narrative       Review of Systems    Physical Exam     ED Triage Vitals   BP --    Pulse --    Resp --    Temp --    Temp src --    Height --    Weight --    SpO2 --    BMI (Calculated) --              Physical Exam    Differential DDx: ***    Is this an Emergent Medical Condition? {SH ED EMERGENT MEDICAL [phone]}  409.901 FS  641.19 FS  627.732 (16) FS    ED Workup   Procedures    Labs:  - - No data to display      Imaging (Read by ED Provider):  {Imaging [phone]}    EKG (Read by ED Provider):  {EKG findings:[phone]}    MDM    ED Course & Re-Evaluation     ED Course         ED Disposition   ED Disposition: No ED Disposition Set      ED Clinical Impression   ED Clinical Impression:   No Clinical Impression Set      ED Patient Status   Patient Status:   [facility]{SH ED JX PATIENT STATUS:[phone]}

## 2016-09-29 NOTE — ED Provider Notes
left   ? Pelvic pain     before hysterectomy   ? Thyroid disease        Past Surgical History:   Procedure Laterality Date   ? COLONOSCOPY     ? COLONOSCOPY W/ OR W/O BIOPSY N/A 01/27/2016    COLONOSCOPY W/ OR W/O BIOPSY performed by Noel GeroldEid, Amalie F, [facility]   ? CYSTOSCOPY      Surgery Description: Cystoscopy (Diagnostic);  Problem Comments: 12/16/12 (Created by Conversion)   ? DILATION AND CURETTAGE OF UTERUS      Surgery Description: Dilation And Curettage;  Problem Comments: for heavy bleeding after second delivery (Created by Conversion)   ? EGD W/ BIOPSY N/A 09/01/2016    EGD W/ BIOPSY performed by Lenis NoonMalespin, Miguel, MD at [facility]   ? HYSTERECTOMY     ? NASAL SEPTUM SURGERY      Surgery Description: Nasal Septal Deviation Repair;   (Created by Conversion)   ? OTHER SURGICAL HISTORY      Surgery Description: Laparoscopy With Total Hysterectomy;  Problem Comments: excision of endometriosis-12/16/12 (Created by Conversion)   ? SALPINGO-OOPHORECTOMY      Surgery Description: Salpingo-oophorectomy Left Side;  Problem Comments: 12/16/12 (Created by Conversion)   ? TUBAL LIGATION     ? TUBAL LIGATION      Surgery Description: Tubal Ligation;   (Created by Conversion)       Family History   Problem Relation Age of Onset   ? High Blood Pressure Mother    ? High Blood Pressure Father    ? Cancer Maternal Uncle    ? Heart Attack Paternal Uncle    ? Heart Attack Paternal Grandfather    ? Breast Cancer Neg Hx        Social History     Social History   ? Marital status: Married     Spouse name: N/A   ? Number of children: N/A   ? Years of education: N/A     Social History Main Topics   ? Smoking status: Former Smoker   ? Smokeless tobacco: Never Used   ? Alcohol use 5.0 oz/week     1 Glasses of wine per week      Comment: very rarely once a year   ? Drug use: No   ? Sexual activity: Yes     Partners: Male     Other Topics Concern   ? Not on file     Social History Narrative       Review of Systems

## 2016-09-29 NOTE — ED Provider Notes
History     Chief Complaint   Patient presents with   ? Finger Injury       HPI Comments: 51 y/o F R handed p/w L thumb pain s/p fall onto hand and having thumb bend backwards.   -did not hit head, not on AC, no LOC  -no pain meds tried    Patient is a 51 y.o. female presenting with Finger Injury. The history is provided by the patient. No language interpreter was used.   Finger Injury   Location:  Finger  Finger location:  L thumb  Injury: yes    Time since incident:  30 minutes  Mechanism of injury: fall    Handedness:  Right-handed  Foreign body present:  Unable to specify  Tetanus status:  Unknown  Prior injury to area:  No  Relieved by:  None tried  Worsened by:  Movement  Ineffective treatments:  None tried  Associated symptoms: decreased range of motion and swelling    Associated symptoms: no fever, no numbness and no tingling    Risk factors: no concern for non-accidental trauma        Allergies   Allergen Reactions   ? Shellfish Allergy Itching and Swelling   ? Azithromycin Rash     Takes benadryl with this medication.   ? Erythromycin Rash     Takes benadryl with this medication.       Patient's Medications   New Prescriptions    No medications on file   Previous Medications    CHOLECALCIFEROL (VITAMIN D-3) 1000 UNITS TABLET    Take 1,000 Units by mouth daily.    CYANOCOBALAMIN (VITAMIN B-12) 50 MCG TABLET    Take by mouth daily. Pt does not know dose.    LEVOTHYROXINE (SYNTHROID, LEVOTHROID) 100 MCG TABLET    Take 1 tablet by mouth daily.    ONDANSETRON (ZOFRAN) 4 MG PO TABLET    Take 1 tablet by mouth daily as needed.   Modified Medications    No medications on file   Discontinued Medications    No medications on file       Past Medical History:   Diagnosis Date   ? Dysmenorrhea    ? Feeling of incomplete bladder emptying    ? Fibromyalgia    ? H/O hyperkalemia    ? History of general anesthesia complication     VERY sensitive to anesthesia   ? Hypothyroid    ? Migraines     hemiplegic migraine

## 2016-09-29 NOTE — ED Provider Notes
History   No chief complaint on file.      HPI Comments: 51 y/o F R handed p/w L thumb pain s/p fall onto hand and having thumb bend backwards.   -did not hit head, not on AC, no LOC  -no pain meds tried    Patient is a 51 y.o. female presenting with Finger Injury. The history is provided by the patient. No language interpreter was used.   Finger Injury   Location:  Finger  Finger location:  L thumb  Injury: yes    Time since incident:  30 minutes  Mechanism of injury: fall    Handedness:  Right-handed  Foreign body present:  Unable to specify  Tetanus status:  Unknown  Prior injury to area:  No  Relieved by:  None tried  Worsened by:  Movement  Ineffective treatments:  None tried  Associated symptoms: decreased range of motion and swelling    Associated symptoms: no fever, no numbness and no tingling    Risk factors: no concern for non-accidental trauma        Allergies   Allergen Reactions   ? Shellfish Allergy Itching and Swelling   ? Azithromycin Rash     Takes benadryl with this medication.   ? Erythromycin Rash     Takes benadryl with this medication.       Patient's Medications   New Prescriptions    No medications on file   Previous Medications    CHOLECALCIFEROL (VITAMIN D-3) 1000 UNITS TABLET    Take 1,000 Units by mouth daily.    CYANOCOBALAMIN (VITAMIN B-12) 50 MCG TABLET    Take by mouth daily. Pt does not know dose.    LEVOTHYROXINE (SYNTHROID, LEVOTHROID) 100 MCG TABLET    Take 1 tablet by mouth daily.    ONDANSETRON (ZOFRAN) 4 MG PO TABLET    Take 1 tablet by mouth daily as needed.   Modified Medications    No medications on file   Discontinued Medications    No medications on file       Past Medical History:   Diagnosis Date   ? Dysmenorrhea    ? Feeling of incomplete bladder emptying    ? Fibromyalgia    ? H/O hyperkalemia    ? History of general anesthesia complication     VERY sensitive to anesthesia   ? Hypothyroid    ? Migraines     hemiplegic migraine   ? Partial tear of Achilles tendon

## 2016-09-29 NOTE — ED Notes
Time of discharge: 1040  AM., Patient discharged to  Home.  Patient discharged  ambulatory. to exit with belongings in  Stable condition.  Patient escorted by  family., Written discharge instructions given to  patient.  Patient/recipient  verbalizes discharge instructions.

## 2016-09-29 NOTE — ED Triage Notes
Pt c/o falling while cleaning the carpet and tried to break her fall by putting her left hand down, now with left thumb pain. A&Ox3, NAD, VSS, resp even and unlabored, ambulatory with steady gait, follows commands, speaking in clear complete sentences, skin warm dry and intact. Oriented to room and provided call light.

## 2016-09-29 NOTE — ED Provider Notes
Review of Systems   Constitutional: Negative for fever and chills.   HENT: Negative.    Eyes: Negative.    Respiratory: Negative for cough and shortness of breath.    Cardiovascular: Negative for chest pain, palpitations and leg swelling.   Gastrointestinal: Negative for nausea, vomiting, abdominal pain, diarrhea, constipation and blood in stool.   Genitourinary: Negative for dysuria, hematuria, vaginal bleeding and vaginal discharge.   Musculoskeletal: Positive for arthralgias.   Neurological: Negative for syncope, light-headedness and headaches.   All other systems reviewed and are negative.      Physical Exam       ED Triage Vitals   BP 09/29/16 1017 122/70   Pulse 09/29/16 1017 72   Resp 09/29/16 1017 16   Temp 09/29/16 1017 36.4 ?C (97.5 ?F)   Temp src 09/29/16 1017 Oral   Height 09/29/16 1017 1.676 m   Weight 09/29/16 1017 102.1 kg   SpO2 09/29/16 1017 99 %   BMI (Calculated) 09/29/16 1017 36.39             Physical Exam   Constitutional: She is oriented to person, place, and time. She appears well-developed and well-nourished. No distress.   HENT:   Head: Normocephalic and atraumatic.   Mouth/Throat: No oropharyngeal exudate.   Eyes: Conjunctivae and EOM are normal. Pupils are equal, round, and reactive to light. No scleral icterus.   Neck: Neck supple. No tracheal deviation present.   Cardiovascular: Normal rate, regular rhythm, normal heart sounds and intact distal pulses.  Exam reveals no gallop and no friction rub.    No murmur heard.  Pulmonary/Chest: Effort normal and breath sounds normal. No respiratory distress. She has no wheezes. She has no rales.   Abdominal: Soft. Bowel sounds are normal. She exhibits no distension. There is no tenderness. There is no rebound and no guarding.   Musculoskeletal: She exhibits edema and tenderness.   L hand:  Swelling to L 1st MCP jt No discoloration or fight bite  2+ radial  Sensation intact to radial, median ulnar nerve

## 2016-09-29 NOTE — ED Provider Notes
Constitutional: Negative for fever and chills.   HENT: Negative.    Eyes: Negative.    Respiratory: Negative for cough and shortness of breath.    Cardiovascular: Negative for chest pain, palpitations and leg swelling.   Gastrointestinal: Negative for nausea, vomiting, abdominal pain, diarrhea, constipation and blood in stool.   Genitourinary: Negative for dysuria, hematuria, vaginal bleeding and vaginal discharge.   Musculoskeletal: Positive for arthralgias.   Neurological: Negative for syncope, light-headedness and headaches.   All other systems reviewed and are negative.      Physical Exam     ED Triage Vitals   BP --    Pulse --    Resp --    Temp --    Temp src --    Height --    Weight --    SpO2 --    BMI (Calculated) --              Physical Exam   Constitutional: She is oriented to person, place, and time. She appears well-developed and well-nourished. No distress.   HENT:   Head: Normocephalic and atraumatic.   Mouth/Throat: No oropharyngeal exudate.   Eyes: Conjunctivae and EOM are normal. Pupils are equal, round, and reactive to light. No scleral icterus.   Neck: Neck supple. No tracheal deviation present.   Cardiovascular: Normal rate, regular rhythm, normal heart sounds and intact distal pulses.  Exam reveals no gallop and no friction rub.    No murmur heard.  Pulmonary/Chest: Effort normal and breath sounds normal. No respiratory distress. She has no wheezes. She has no rales.   Abdominal: Soft. Bowel sounds are normal. She exhibits no distension. There is no tenderness. There is no rebound and no guarding.   Musculoskeletal: She exhibits edema and tenderness.   L hand:  Swelling to L 1st MCP jt No discoloration or fight bite  2+ radial  Sensation intact to radial, median ulnar nerve  Tenderness over entire thumb and 1st metacarpal ; no snuff box tenderness  Full ROM (flexion / extension / abduction / adduction) of wrist, MCP, PIP and DIP of digits 2-5 Lumbricals and interosseous intact.

## 2016-10-03 NOTE — ED Attestation Note
Attestation   I have made corrections and additions as appropriate. I discussed this patient with the resident/fellow.              Dishong, Harlon DittyWilliam Patrick, MD  10/03/16 1128

## 2016-10-05 DIAGNOSIS — E039 Hypothyroidism, unspecified: Principal | ICD-10-CM

## 2016-10-05 MED ORDER — LEVOTHYROXINE SODIUM 100 MCG PO TABS
100 ug | Freq: Every day | ORAL | 3 refills | Status: CP
Start: 2016-10-05 — End: ?

## 2016-10-09 ENCOUNTER — Ambulatory Visit: Attending: Orthopaedic Surgery | Primary: Family Medicine

## 2016-10-09 DIAGNOSIS — S6992XS Unspecified injury of left wrist, hand and finger(s), sequela: Principal | ICD-10-CM

## 2016-10-09 DIAGNOSIS — S63642A Sprain of metacarpophalangeal joint of left thumb, initial encounter: Secondary | ICD-10-CM

## 2016-10-09 NOTE — Progress Notes
Department of Orthopedics   New Patient Visit        Assessment:     ICD-10-CM ICD-9-CM   1. Thumb injury, left, sequela S69.92XS 908.9   2. Rupture of ulnar collateral ligament of thumb, left, initial encounter S63.642A 842.12       Plan:   No orders of the following type(s) were placed in this encounter: Procedures    No orders of the following type(s) were placed in this encounter: Medications.       Will plan for surgery with Dr. Purnell ShoemakerKaye to perform L thumb UCL repair. RBA of surgical and non surgical management discussed with patient. All questions answered.   Case request completed today.   F/u with Dr. Purnell ShoemakerKaye for outpatient surgery.     Referring Physician/Primary Care Physician:  Thayer HeadingsScuderi, Christopher B, DO    Chief Complaint:  Chief Complaint   Patient presents with   ? Hand Injury     left thumb/ seen at [facility] 09/29/16     HPI:  NEW PATIENT: Patient is a very pleasant 51 y.o. lady RHD who presents today with a 11 day(s) history of left thumb pain.  Onset of pain provoked by ground level fall where patients thumb was 'pulled backward'. Quality of pain is sharp, constant and throbbing made worse by movement, gripping objects.  Intensity of pain rated as a 8/10.  Treatment has included NSAIDs, narcotics, OTC thumb splint.  Pain has improved with no movement.    This limits the patient?s ability to do work, ADLs,.  It is impacting their overall quality of life.  Allergies:  Allergies   Allergen Reactions   ? Shellfish Allergy Itching and Swelling   ? Azithromycin Rash     Takes benadryl with this medication.   ? Erythromycin Rash     Takes benadryl with this medication.       Current Meds:  Current Outpatient Prescriptions   Medication Sig Dispense Refill   ? cholecalciferol (VITAMIN D-3) 1000 UNITS Tablet Take 1,000 Units by mouth daily.     ? cyanocobalamin (VITAMIN B-12) 50 MCG Tablet Take by mouth daily. Pt does not know dose.     ? levothyroxine (SYNTHROID, LEVOTHROID) 100 MCG PO Tablet Take 1 tablet by

## 2016-10-09 NOTE — Progress Notes
AROM: L thumb MP Flex 0 IP Flex 10 PIP Ext 0    Point tenderness: TFCC negative TFCC with Ulnar Dev negative TH JT negative 1st Metacarpal positive 5th MC Base negative Rad Styloid negative SL lig negative Snuff Box negative 1st MP positive PIP negative DIP negative Med Epi negative Lat Epi negative   Strength: Grip 0/5 Finger Abduction 4/5 Wrist Ext 5/5 Thumb Abd 0/5  Tinel's: Med negative Ulna @ elbow negative  Sensation: Radialintact Ulnarintact Mediandecreased and at tip of thumb Axillaryintact Musculocutaneousintact Lateral antebrachial cutaneousintact   Reflexes: Hoffman-  Pulses: Rad2+ Ulnar2+      Test Conclusion:  XR L hand 09/29/16   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 We discussed the surgery to entail L thumb UCL repair to be done by Dr. Purnell ShoemakerKaye. We discussed the risks and benefits of surgery including infection, nerve or vessel injury, arthrofibrosis, continued pain and the need for further surgery. The patient understands the risks and benefits and wishes to proceed with surgery.

## 2016-10-09 NOTE — Progress Notes
mouth daily. 90 tablet 3   ? ondansetron (ZOFRAN) 4 MG PO Tablet Take 1 tablet by mouth daily as needed. 30 tablet 1     No current facility-administered medications for this visit.        Active Problems:  Patient Active Problem List   Diagnosis   ? S/P laparoscopic hysterectomy   ? Benign neoplasm of skin, site unspecified   ? Chronic lymphocytic thyroiditis   ? Erythromycin and other macrolides causing adverse effect in therapeutic use   ? Myalgia and myositis, unspecified   ? Unspecified hypertrophic and atrophic condition of skin   ? Nonspecific abnormal results of thyroid function study   ? Dyspareunia   ? Unspecified symptom associated with female genital organs   ? Vitamin D deficiency   ? Excessive or frequent menstruation   ? Premenopausal menorrhagia   ? Dysmenorrhea   ? Leiomyoma of uterus, unspecified   ? Anemia   ? Follow-up examination, following unspecified surgery   ? Screening examination for pulmonary tuberculosis   ? Bacterial vaginosis   ? Painful bladder spasm   ? Urinary tract infection, site not specified   ? Fibromyalgia   ? Need for hepatitis C screening test   ? Hyperkalemia   ? Hypothyroidism   ? Serum potassium elevated   ? Dyspnea on exertion   ? Atypical chest pain   ? Dyslipidemia   ? Class 1 obesity due to excess calories in adult   ? Nausea   ? Abdominal pain, unspecified abdominal location   ? Thumb injury, left, sequela   ? Rupture of ulnar collateral ligament of thumb, left, initial encounter       PMH:  Past Medical History:   Diagnosis Date   ? Dysmenorrhea    ? Feeling of incomplete bladder emptying    ? Fibromyalgia    ? H/O hyperkalemia    ? History of general anesthesia complication     VERY sensitive to anesthesia   ? Hypothyroid    ? Migraines     hemiplegic migraine   ? Partial tear of Achilles tendon     left   ? Pelvic pain     before hysterectomy   ? Thyroid disease        PSH:  Past Surgical History:   Procedure Laterality Date   ? COLONOSCOPY

## 2016-10-09 NOTE — Progress Notes
?   COLONOSCOPY W/ OR W/O BIOPSY N/A 01/27/2016    COLONOSCOPY W/ OR W/O BIOPSY performed by Noel GeroldEid, Amalie F, [facility]   ? CYSTOSCOPY      Surgery Description: Cystoscopy (Diagnostic);  Problem Comments: 12/16/12 (Created by Conversion)   ? DILATION AND CURETTAGE OF UTERUS      Surgery Description: Dilation And Curettage;  Problem Comments: for heavy bleeding after second delivery (Created by Conversion)   ? EGD W/ BIOPSY N/A 09/01/2016    EGD W/ BIOPSY performed by Lenis NoonMalespin, Miguel, MD [facility]   ? HYSTERECTOMY     ? NASAL SEPTUM SURGERY      Surgery Description: Nasal Septal Deviation Repair;   (Created by Conversion)   ? OTHER SURGICAL HISTORY      Surgery Description: Laparoscopy With Total Hysterectomy;  Problem Comments: excision of endometriosis-12/16/12 (Created by Conversion)   ? SALPINGO-OOPHORECTOMY      Surgery Description: Salpingo-oophorectomy Left Side;  Problem Comments: 12/16/12 (Created by Conversion)   ? TUBAL LIGATION     ? TUBAL LIGATION      Surgery Description: Tubal Ligation;   (Created by Conversion)       Social Hx:  Social History     Social History   ? Marital status: Married     Spouse name: N/A   ? Number of children: N/A   ? Years of education: N/A     Occupational History   ? Not on file.     Social History Main Topics   ? Smoking status: Former Smoker   ? Smokeless tobacco: Never Used   ? Alcohol use 5.0 oz/week     1 Glasses of wine per week      Comment: very rarely once a year   ? Drug use: No   ? Sexual activity: Yes     Partners: Male     Other Topics Concern   ? Not on file     Social History Narrative        Family Hx:  Family History   Problem Relation Age of Onset   ? High Blood Pressure Mother    ? High Blood Pressure Father    ? Cancer Maternal Uncle    ? Heart Attack Paternal Uncle    ? Heart Attack Paternal Grandfather    ? Breast Cancer Neg Hx        ROS:   Constitutional: denies fever, chills, or night sweats

## 2016-10-09 NOTE — Progress Notes
HEENT: denies headaches, visual disturbance, or changes in hearing  Respiratory: denies cough, shortness of breath  Cardiovascular: denies chest pain or palpitations  GI: denies nausea, vomiting, diarrhea, constipation, anorexia, and weight loss or gain  Neurological: denies numbness, weakness,   GU: denies dysuria, hematuria, and increase or decrease in urination  Integumentary: denies rashes or other lesions  Psychiatric: denies changes in mood or affect      Vital Signs:   VITAL SIGNS (all recorded)      Clinic Vitals       10/09/16 1407             Amb Encounter Vitals    Weight 103 kg (227 lb)    -VL at 10/09/16 1407       Height 1.676 m (5\' 6" )    -VL at 10/09/16 1407       BMI (Calculated) 36.72    -VL at 10/09/16 1407       BSA (Calculated - sq m) 2.19    -VL at 10/09/16 1407       BP 127/81    -VL at 10/09/16 1407       BP Location Right lower arm    -VL at 10/09/16 1407       Position Sitting    -VL at 10/09/16 1407       Pulse 82    -VL at 10/09/16 1407       Pulse Source Monitor    -VL at 10/09/16 1407       Pulse Quality Normal    -VL at 10/09/16 1407       Resp 16    -VL at 10/09/16 1407       Respiration Quality Normal    -VL at 10/09/16 1407       Temp 36 ?C (96.8 ?F)    -VL at 10/09/16 1407       Temperature Source Oral    -VL at 10/09/16 1407       Pain Score SEVEN    -VL at 10/09/16 1407       Location left thumb    -VL at 10/09/16 1407       Education/Communication Barriers?    Learning/Communication Barriers? No    -VL at 10/09/16 1407       Fall Risk Assessment    Had recent fall / Last 6 months? Yes    -VL at 10/09/16 1407       Does patient have a fear of falling? No    -VL at 10/09/16 1407         User Key  (r) = Recorded By, (t) = Taken By, (c) = Cosigned By    Initials Name Effective Dates    VL Littles, [place], [place] 12/07/15 -           Physical Exam:  WRIST/HAND/ELBOW    Left  General Appearance: deformity and swelling of L thumb AAAOX3 and WD/WN  Incision: None

## 2016-10-10 NOTE — Progress Notes
Teaching/Attestation Statement  I saw and evaluated the patient.  I reviewed the Resident note and agree with the assessment and plan. See Resident's note for details.

## 2016-10-11 DIAGNOSIS — Z87891 Personal history of nicotine dependence: Secondary | ICD-10-CM

## 2016-10-11 DIAGNOSIS — S6992XS Unspecified injury of left wrist, hand and finger(s), sequela: Secondary | ICD-10-CM

## 2016-10-11 DIAGNOSIS — Z79899 Other long term (current) drug therapy: Secondary | ICD-10-CM

## 2016-10-11 DIAGNOSIS — S5332XA Traumatic rupture of left ulnar collateral ligament, initial encounter: Principal | ICD-10-CM

## 2016-10-13 ENCOUNTER — Ambulatory Visit: Primary: Family Medicine

## 2016-10-13 DIAGNOSIS — Z79899 Other long term (current) drug therapy: Secondary | ICD-10-CM

## 2016-10-13 DIAGNOSIS — R3914 Feeling of incomplete bladder emptying: Secondary | ICD-10-CM

## 2016-10-13 DIAGNOSIS — M797 Fibromyalgia: Secondary | ICD-10-CM

## 2016-10-13 DIAGNOSIS — R102 Pelvic and perineal pain: Secondary | ICD-10-CM

## 2016-10-13 DIAGNOSIS — Z8639 Personal history of other endocrine, nutritional and metabolic disease: Secondary | ICD-10-CM

## 2016-10-13 DIAGNOSIS — S5332XA Traumatic rupture of left ulnar collateral ligament, initial encounter: Principal | ICD-10-CM

## 2016-10-13 DIAGNOSIS — S86019A Strain of unspecified Achilles tendon, initial encounter: Secondary | ICD-10-CM

## 2016-10-13 DIAGNOSIS — N946 Dysmenorrhea, unspecified: Secondary | ICD-10-CM

## 2016-10-13 DIAGNOSIS — Z87891 Personal history of nicotine dependence: Secondary | ICD-10-CM

## 2016-10-13 DIAGNOSIS — E039 Hypothyroidism, unspecified: Secondary | ICD-10-CM

## 2016-10-13 DIAGNOSIS — G43909 Migraine, unspecified, not intractable, without status migrainosus: Secondary | ICD-10-CM

## 2016-10-13 DIAGNOSIS — E079 Disorder of thyroid, unspecified: Principal | ICD-10-CM

## 2016-10-13 DIAGNOSIS — Z9189 Other specified personal risk factors, not elsewhere classified: Secondary | ICD-10-CM

## 2016-10-16 ENCOUNTER — Inpatient Hospital Stay

## 2016-10-16 ENCOUNTER — Ambulatory Visit

## 2016-10-16 DIAGNOSIS — M797 Fibromyalgia: Secondary | ICD-10-CM

## 2016-10-16 DIAGNOSIS — G43909 Migraine, unspecified, not intractable, without status migrainosus: Secondary | ICD-10-CM

## 2016-10-16 DIAGNOSIS — Z87891 Personal history of nicotine dependence: Secondary | ICD-10-CM

## 2016-10-16 DIAGNOSIS — E039 Hypothyroidism, unspecified: Secondary | ICD-10-CM

## 2016-10-16 DIAGNOSIS — J40 Bronchitis, not specified as acute or chronic: Secondary | ICD-10-CM

## 2016-10-16 DIAGNOSIS — R102 Pelvic and perineal pain: Secondary | ICD-10-CM

## 2016-10-16 DIAGNOSIS — R3914 Feeling of incomplete bladder emptying: Secondary | ICD-10-CM

## 2016-10-16 DIAGNOSIS — S5332XA Traumatic rupture of left ulnar collateral ligament, initial encounter: Principal | ICD-10-CM

## 2016-10-16 DIAGNOSIS — N946 Dysmenorrhea, unspecified: Secondary | ICD-10-CM

## 2016-10-16 DIAGNOSIS — S86019A Strain of unspecified Achilles tendon, initial encounter: Secondary | ICD-10-CM

## 2016-10-16 DIAGNOSIS — Z9189 Other specified personal risk factors, not elsewhere classified: Secondary | ICD-10-CM

## 2016-10-16 DIAGNOSIS — Z79899 Other long term (current) drug therapy: Secondary | ICD-10-CM

## 2016-10-16 DIAGNOSIS — Z8639 Personal history of other endocrine, nutritional and metabolic disease: Secondary | ICD-10-CM

## 2016-10-16 DIAGNOSIS — E079 Disorder of thyroid, unspecified: Secondary | ICD-10-CM

## 2016-10-16 MED ORDER — CEFAZOLIN SODIUM 1 G IJ SOLR
Status: DC | PRN
Start: 2016-10-16 — End: 2016-10-16

## 2016-10-16 MED ORDER — FENTANYL CITRATE INJ 50 MCG/ML CUSTOM AMP/VIAL
Status: DC | PRN
Start: 2016-10-16 — End: 2016-10-16

## 2016-10-16 MED ORDER — LIDOCAINE HCL (PF) 1 % IJ SOLN
INTRAVENOUS | Status: DC | PRN
Start: 2016-10-16 — End: 2016-10-16

## 2016-10-16 MED ORDER — BUPIVACAINE HCL (PF) 0.5 % IJ SOLN
Status: DC | PRN
Start: 2016-10-16 — End: 2016-10-16

## 2016-10-16 MED ORDER — CEPHALEXIN 500 MG PO CAPS
500 mg | Freq: Four times a day (QID) | ORAL | 2 refills | Status: CP
Start: 2016-10-16 — End: 2016-10-31

## 2016-10-16 MED ORDER — NALOXONE HCL 0.4 MG/ML IJ SOLN
.4 mg | INTRAVENOUS | Status: DC | PRN
Start: 2016-10-16 — End: 2016-10-16

## 2016-10-16 MED ORDER — ONDANSETRON HCL 4 MG/2ML IJ SOLN
1 mg | Freq: Once | INTRAVENOUS | Status: DC | PRN
Start: 2016-10-16 — End: 2016-10-16

## 2016-10-16 MED ORDER — LACTATED RINGERS IV SOLN
Status: DC | PRN
Start: 2016-10-16 — End: 2016-10-16

## 2016-10-16 MED ORDER — KETOROLAC TROMETHAMINE 30 MG/ML IJ SOLN
Status: DC | PRN
Start: 2016-10-16 — End: 2016-10-16

## 2016-10-16 MED ORDER — FENTANYL CITRATE INJ 50 MCG/ML CUSTOM AMP/VIAL
50 ug | INTRAVENOUS | Status: DC | PRN
Start: 2016-10-16 — End: 2016-10-16

## 2016-10-16 MED ORDER — MIDAZOLAM HCL 2 MG/2ML IJ SOLN
Status: DC | PRN
Start: 2016-10-16 — End: 2016-10-16

## 2016-10-16 MED ORDER — PROPOFOL 10 MG/ML IV EMUL CUSTOM SH
Status: DC | PRN
Start: 2016-10-16 — End: 2016-10-16

## 2016-10-16 MED ORDER — LACTATED RINGERS IV SOLN
1000 mL | Freq: Once | INTRAVENOUS | Status: CP
Start: 2016-10-16 — End: ?

## 2016-10-16 MED ORDER — OXYCODONE HCL 5 MG PO TABS
10 mg | Freq: Once | ORAL | Status: CP | PRN
Start: 2016-10-16 — End: ?

## 2016-10-16 MED ORDER — ONDANSETRON HCL 4 MG/2ML IJ SOLN
Status: DC | PRN
Start: 2016-10-16 — End: 2016-10-16

## 2016-10-16 MED ORDER — HYDROCODONE-ACETAMINOPHEN 5-325 MG PO TABS
1-2 | ORAL_TABLET | ORAL | 0 refills | Status: CP | PRN
Start: 2016-10-16 — End: ?

## 2016-10-16 MED ORDER — NALBUPHINE HCL 10 MG/ML IJ SOLN
2.5 mg | Freq: Once | INTRAVENOUS | Status: DC | PRN
Start: 2016-10-16 — End: 2016-10-16

## 2016-10-16 MED ORDER — DEXAMETHASONE SODIUM PHOSPHATE 4 MG/ML CUSTOM COMPONENT JX
Status: DC | PRN
Start: 2016-10-16 — End: 2016-10-16

## 2016-10-16 NOTE — H&P
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  Procedure:Procedures  None                ?  Patient Counseling & Education:  Discussed diagnosis and treatment options with the patient. We discussed the surgery to entail L thumb UCL repair to be done by Dr. Kaye. We discussed the risks and benefits of surgery including infection, nerve or vessel injury, arthrofibrosis, continued pain and the need for further surgery. The patient understands the risks and benefits and wishes to proceed with surgery.  ?    Luke Rasmussen, MD  2/12/20188:48 AM

## 2016-10-16 NOTE — H&P
?   SALPINGO-OOPHORECTOMY ? ?   ? Surgery Description: Salpingo-oophorectomy Left Side;  Problem Comments: 12/16/12 (Created by Conversion)   ? TUBAL LIGATION ? ?   ? TUBAL LIGATION ? ?   ? Surgery Description: Tubal Ligation;   (Created by Conversion)      ?  ?  Social Hx:   Social?History    Social History   ?        Social History   ? Marital status: Married   ? ? Spouse name: N/A   ? Number of children: N/A   ? Years of education: N/A   ?      Occupational History   ? Not on file.   ?          Social History Main Topics   ? Smoking status: Former Smoker   ? Smokeless tobacco: Never Used   ? Alcohol use 5.0 oz/week    ? ? 1 Glasses of wine per week    ? ? ? Comment: very rarely once a year   ? Drug use: No   ? Sexual activity: Yes   ? ? Partners: Male   ?       Other Topics Concern   ? Not on file   ?  Social History Narrative      ?  ??  Family Hx:   Family?History          Family History   Problem Relation Age of Onset   ? High Blood Pressure Mother ?   ? High Blood Pressure Father ?   ? Cancer Maternal Uncle ?   ? Heart Attack Paternal Uncle ?   ? Heart Attack Paternal Grandfather ?   ? Breast Cancer Neg Hx ?      ?  ?  ROS:   Constitutional: denies fever, chills, or night sweats  HEENT: denies headaches, visual disturbance, or changes in hearing  Respiratory: denies cough, shortness of breath  Cardiovascular: denies chest pain or palpitations  GI: denies nausea, vomiting, diarrhea, constipation, anorexia, and weight loss or gain  Neurological: denies numbness, weakness,   GU: denies dysuria, hematuria, and increase or decrease in urination  Integumentary: denies rashes or other lesions  Psychiatric: denies changes in mood or affect  ?  ?   Vitals    Vital Signs:   VITAL SIGNS (all recorded)   ?   Clinic Vitals     ?? 10/09/16 1407 ? ? ?           ? Amb Encounter Vitals   ? Weight 103 kg (227 lb)    -VL at 10/09/16 1407 ? ? ?   ? Height 1.676 m (5\' 6" )    -VL at 10/09/16 1407 ? ? ?   ? BMI (Calculated) 36.72

## 2016-10-16 NOTE — H&P
Read By Alysia Penna- Paul Wasserman D.O.    Electronically Verified By Alysia Penna- Paul Wasserman D.O.    Released Date Time - 09/29/2016 10:17 AM    Resident -   ?  Procedure:Procedures  None                ?  Patient Counseling & Education:  Discussed diagnosis and treatment options with the patient. We discussed the surgery to entail L thumb UCL repair to be done by Dr. Purnell ShoemakerKaye. We discussed the risks and benefits of surgery including infection, nerve or vessel injury, arthrofibrosis, continued pain and the need for further surgery. The patient understands the risks and benefits and wishes to proceed with surgery.  ?    Angela KohlerLuke Rasmussen, MD  2/12/20188:48 AM

## 2016-10-16 NOTE — H&P
?   Screening examination for pulmonary tuberculosis   ? Bacterial vaginosis   ? Painful bladder spasm   ? Urinary tract infection, site not specified   ? Fibromyalgia   ? Need for hepatitis C screening test   ? Hyperkalemia   ? Hypothyroidism   ? Serum potassium elevated   ? Dyspnea on exertion   ? Atypical chest pain   ? Dyslipidemia   ? Class 1 obesity due to excess calories in adult   ? Nausea   ? Abdominal pain, unspecified abdominal location   ? Thumb injury, left, sequela   ? Rupture of ulnar collateral ligament of thumb, left, initial encounter   ?  ?  PMH:   Past?Medical?History         Past Medical History:   Diagnosis Date   ? Dysmenorrhea ?   ? Feeling of incomplete bladder emptying ?   ? Fibromyalgia ?   ? H/O hyperkalemia ?   ? History of general anesthesia complication ?   ? VERY sensitive to anesthesia   ? Hypothyroid ?   ? Migraines ?   ? hemiplegic migraine   ? Partial tear of Achilles tendon ?   ? left   ? Pelvic pain ?   ? before hysterectomy   ? Thyroid disease ?      ?  ?  PSH:   Past?Surgical?History          Past Surgical History:   Procedure Laterality Date   ? COLONOSCOPY ? ?   ? COLONOSCOPY W/ OR W/O BIOPSY N/A 01/27/2016   ? COLONOSCOPY W/ OR W/O BIOPSY performed by Eid, Amalie F, MD at [facility]   ? CYSTOSCOPY ? ?   ? Surgery Description: Cystoscopy (Diagnostic);  Problem Comments: 12/16/12 (Created by Conversion)   ? DILATION AND CURETTAGE OF UTERUS ? ?   ? Surgery Description: Dilation And Curettage;  Problem Comments: for heavy bleeding after second delivery (Created by Conversion)   ? EGD W/ BIOPSY N/A 09/01/2016   ? EGD W/ BIOPSY performed by Malespin, Miguel, MD at [facility]   ? HYSTERECTOMY ? ?   ? NASAL SEPTUM SURGERY ? ?   ? Surgery Description: Nasal Septal Deviation Repair;   (Created by Conversion)   ? OTHER SURGICAL HISTORY ? ?   ? Surgery Description: Laparoscopy With Total Hysterectomy;  Problem Comments: excision of endometriosis-12/16/12 (Created by Conversion)

## 2016-10-16 NOTE — H&P
?   SALPINGO-OOPHORECTOMY ? ?   ? Surgery Description: Salpingo-oophorectomy Left Side;  Problem Comments: 12/16/12 (Created by Conversion)   ? TUBAL LIGATION ? ?   ? TUBAL LIGATION ? ?   ? Surgery Description: Tubal Ligation;   (Created by Conversion)      ?  ?  Social Hx:   Social?History    Social History   ?        Social History   ? Marital status: Married   ? ? Spouse name: N/A   ? Number of children: N/A   ? Years of education: N/A   ?      Occupational History   ? Not on file.   ?          Social History Main Topics   ? Smoking status: Former Smoker   ? Smokeless tobacco: Never Used   ? Alcohol use 5.0 oz/week    ? ? 1 Glasses of wine per week    ? ? ? Comment: very rarely once a year   ? Drug use: No   ? Sexual activity: Yes   ? ? Partners: Male   ?       Other Topics Concern   ? Not on file   ?  Social History Narrative      ?  ??  Family Hx:   Family?History          Family History   Problem Relation Age of Onset   ? High Blood Pressure Mother ?   ? High Blood Pressure Father ?   ? Cancer Maternal Uncle ?   ? Heart Attack Paternal Uncle ?   ? Heart Attack Paternal Grandfather ?   ? Breast Cancer Neg Hx ?      ?  ?  ROS:   Constitutional: denies fever, chills, or night sweats  HEENT: denies headaches, visual disturbance, or changes in hearing  Respiratory: denies cough, shortness of breath  Cardiovascular: denies chest pain or palpitations  GI: denies nausea, vomiting, diarrhea, constipation, anorexia, and weight loss or gain  Neurological: denies numbness, weakness,   GU: denies dysuria, hematuria, and increase or decrease in urination  Integumentary: denies rashes or other lesions  Psychiatric: denies changes in mood or affect  ?  ?   Vitals    Vital Signs:   VITAL SIGNS (all recorded)   ?   Clinic Vitals     ?? 10/09/16 1407 ? ? ?           ? Amb Encounter Vitals   ? Weight 103 kg (227 lb)    -VL at 10/09/16 1407 ? ? ?   ? Height 1.676 m (5' 6")    -VL at 10/09/16 1407 ? ? ?   ? BMI (Calculated) 36.72

## 2016-10-16 NOTE — H&P
?   Screening examination for pulmonary tuberculosis   ? Bacterial vaginosis   ? Painful bladder spasm   ? Urinary tract infection, site not specified   ? Fibromyalgia   ? Need for hepatitis C screening test   ? Hyperkalemia   ? Hypothyroidism   ? Serum potassium elevated   ? Dyspnea on exertion   ? Atypical chest pain   ? Dyslipidemia   ? Class 1 obesity due to excess calories in adult   ? Nausea   ? Abdominal pain, unspecified abdominal location   ? Thumb injury, left, sequela   ? Rupture of ulnar collateral ligament of thumb, left, initial encounter   ?  ?  PMH:   Past?Medical?History         Past Medical History:   Diagnosis Date   ? Dysmenorrhea ?   ? Feeling of incomplete bladder emptying ?   ? Fibromyalgia ?   ? H/O hyperkalemia ?   ? History of general anesthesia complication ?   ? VERY sensitive to anesthesia   ? Hypothyroid ?   ? Migraines ?   ? hemiplegic migraine   ? Partial tear of Achilles tendon ?   ? left   ? Pelvic pain ?   ? before hysterectomy   ? Thyroid disease ?      ?  ?  PSH:   Past?Surgical?History          Past Surgical History:   Procedure Laterality Date   ? COLONOSCOPY ? ?   ? COLONOSCOPY W/ OR W/O BIOPSY N/A 01/27/2016   ? COLONOSCOPY W/ OR W/O BIOPSY performed [facility] Noel GeroldEid, Amalie F[facility]   ? CYSTOSCOPY ? ?   ? Surgery Description: Cystoscopy (Diagnostic);  Problem Comments: 12/16/12 (Created by Conversion)   ? DILATION AND CURETTAGE OF UTERUS ? ?   ? Surgery Description: Dilation And Curettage;  Problem Comments: for heavy bleeding after second delivery (Created by Conversion)   ? EGD W/ BIOPSY N/A 09/01/2016   ? EGD W/ BIOPSY performed by Lenis NoonMalespin, Miguel[facility]   ? HYSTERECTOMY ? ?   ? NASAL SEPTUM SURGERY ? ?   ? Surgery Description: Nasal Septal Deviation Repair;   (Created by Conversion)   ? OTHER SURGICAL HISTORY ? ?   ? Surgery Description: Laparoscopy With Total Hysterectomy;  Problem Comments: excision of endometriosis-12/16/12 (Created by Conversion)

## 2016-10-16 NOTE — H&P
Read By - Paul Wasserman D.O.    Electronically Verified By - Paul Wasserman D.O.    Released Date Time - 09/29/2016 10:17 AM    Resident -   ?  Procedure:Procedures  None                ?  Patient Counseling & Education:  Discussed diagnosis and treatment options with the patient. We discussed the surgery to entail L thumb UCL repair to be done by Dr. Kaye. We discussed the risks and benefits of surgery including infection, nerve or vessel injury, arthrofibrosis, continued pain and the need for further surgery. The patient understands the risks and benefits and wishes to proceed with surgery.  ?    Luke Rasmussen, MD  2/12/20188:48 AM

## 2016-10-16 NOTE — H&P
?   Urinary tract infection, site not specified   ? Fibromyalgia   ? Need for hepatitis C screening test   ? Hyperkalemia   ? Hypothyroidism   ? Serum potassium elevated   ? Dyspnea on exertion   ? Atypical chest pain   ? Dyslipidemia   ? Class 1 obesity due to excess calories in adult   ? Nausea   ? Abdominal pain, unspecified abdominal location   ? Thumb injury, left, sequela   ? Rupture of ulnar collateral ligament of thumb, left, initial encounter   ?  ?  PMH:   Past?Medical?History         Past Medical History:   Diagnosis Date   ? Dysmenorrhea ?   ? Feeling of incomplete bladder emptying ?   ? Fibromyalgia ?   ? H/O hyperkalemia ?   ? History of general anesthesia complication ?   ? VERY sensitive to anesthesia   ? Hypothyroid ?   ? Migraines ?   ? hemiplegic migraine   ? Partial tear of Achilles tendon ?   ? left   ? Pelvic pain ?   ? before hysterectomy   ? Thyroid disease ?      ?  ?  PSH:   Past?Surgical?History          Past Surgical History:   Procedure Laterality Date   ? COLONOSCOPY ? ?   ? COLONOSCOPY W/ OR W/O BIOPSY N/A 01/27/2016   ? COLONOSCOPY W/ OR W/O BIOPSY performed by Noel GeroldEid, Amalie F[facility]   ? CYSTOSCOPY ? ?   ? Surgery Description: Cystoscopy (Diagnostic);  Problem Comments: 12/16/12 (Created by Conversion)   ? DILATION AND CURETTAGE OF UTERUS ? ?   ? Surgery Description: Dilation And Curettage;  Problem Comments: for heavy bleeding after second delivery (Created by Conversion)   ? EGD W/ BIOPSY N/A 09/01/2016   ? EGD W/ BIOPSY performed [facility] Lenis No[facility]Malespin, Miguel[facility]   ? HYSTERECTOMY ? ?   ? NASAL SEPTUM SURGERY ? ?   ? Surgery Description: Nasal Septal Deviation Repair;   (Created by Conversion)   ? OTHER SURGICAL HISTORY ? ?   ? Surgery Description: Laparoscopy With Total Hysterectomy;  Problem Comments: excision of endometriosis-12/16/12 (Created by Conversion)   ? SALPINGO-OOPHORECTOMY ? ?   ? Surgery Description: Salpingo-oophorectomy Left Side;  Problem Comments:

## 2016-10-16 NOTE — H&P
-  VL at 10/09/16 1407 ? ? ?   ? BP Location Right lower arm    -VL at 10/09/16 1407 ? ? ?   ? Position Sitting    -VL at 10/09/16 1407 ? ? ?   ? Pulse 82    -VL at 10/09/16 1407 ? ? ?   ? Pulse Source Monitor    -VL at 10/09/16 1407 ? ? ?   ? Pulse Quality Normal    -VL at 10/09/16 1407 ? ? ?   ? Resp 16    -VL at 10/09/16 1407 ? ? ?   ? Respiration Quality Normal    -VL at 10/09/16 1407 ? ? ?   ? Temp 36 ?C (96.8 ?F)    -VL at 10/09/16 1407 ? ? ?   ? Temperature Source Oral    -VL at 10/09/16 1407 ? ? ?   ? Pain Score SEVEN    -VL at 10/09/16 1407 ? ? ?   ? Location left thumb    -VL at 10/09/16 1407 ? ? ?   ? Education/Communication Barriers?   ? Learning/Communication Barriers? No    -VL at 10/09/16 1407 ? ? ?   ? Fall Risk Assessment   ? Had recent fall / Last 6 months? Yes    -VL at 10/09/16 1407 ? ? ?   ? Does patient have a fear of falling? No    -VL at 10/09/16 1407 ? ? ?          User Key  (r) = Recorded By, (t) = Taken By, (c) = Cosigned By    Initials Name Effective Dates   ? VL Littles, [place], [place] 12/07/15 -          ?  ?  Physical Exam:  BP 105/66 - Pulse 80 - Temp 36.6 ?C (97.8 ?F) (Temporal)  - Resp 17 - SpO2 98%    General Appearance: NAD, AOx3.  Left Hand/Wrist: Skin intact. No erythema, + swelling of the L thumb MCP area. Patient reluctant to move thumb IP and MCP. Thumb IP stable to stress. Thumb MCP stable to stress of the RCL, dorsal capsule, and volar plate. UCL without endpoint at 0' and 30'. + Stener lesion palpated. TTP UCL insertion. No clicking of the thumb A1 with active FPL/EPL. + Sensation thumb tip to deep touch. Motor present to FPL/EPL. CR< 2 sec. No PL tendon present on left, but present on right.  ?  ?  Test Conclusion:  XR HAND LEFT 3 VIEWS  ?  Indications:  Thumb pain  ?  Comparison:  None  ?  IMPRESSION:  Findings/Impression:  Negative for fracture of the left thumb. 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 No erosions.  ?

## 2016-10-16 NOTE — H&P
digital range of motion exercises 10x TID, dressing/splint, and pain medication prn.  Avoid NSAIDs as may decrease healing.  All questions and concerns were addressed.   Plan:  All questions and concerns addressed.  Follow up in clinic with Dr. Purnell ShoemakerKaye in 2 weeks. Please call [phone] to schedule or confirm your appointment.  Keep surgical dressing clean, dry and intact until your follow up appointment.  Elevate the surgical extremity to reduce swelling.  Complete hand ROM exercises as demonstrated to you in preop, 10 times for 3 sets daily.  Take short course of abx as perscribed. Perscription on chart for Keflex  Take pain medications as perscribed. Do not drive, operate heavy machinery or participate in dangerous activities while taking pain medications. Perscription on chart for Norco 5/325 mg   Call [phone] with questions and concerns.        Department of Orthopedics   Established Patient Visit  ?  ?  ?  Assessment:   ? ? ICD-10-CM ICD-9-CM   1. Thumb injury, left, sequela S69.92XS 908.9   2. Rupture of ulnar collateral ligament of thumb, left, initial encounter G95.621HS63.642A 842.12   ?  ?  Plan:   No orders of the following type(s) were placed in this encounter: Procedures  ?  No orders of the following type(s) were placed in this encounter: Medications.     Will plan for surgery with Dr. Purnell ShoemakerKaye to perform L thumb UCL repair. RBA of surgical and non surgical management discussed with patient. All questions answered.   Case request completed today.   F/u with Dr. Purnell ShoemakerKaye for outpatient surgery.   ?  Referring Physician/Primary Care Physician:  Thayer HeadingsScuderi, Christopher B, DO  ?  Chief Complaint:       Chief Complaint   Patient presents with   ? Hand Injury   ? ? left thumb/ seen at [facility] 09/29/16   ?  HPI:  NEW PATIENT: Patient is a very pleasant 51 y.o. lady RHD who presents today with a 11 day(s) history of left thumb pain.  Onset of pain provoked

## 2016-10-16 NOTE — H&P
of pain is sharp, constant and throbbing made worse by movement, gripping objects.  Intensity of pain rated as a 8/10.  Treatment has included NSAIDs, narcotics, OTC thumb splint.  Pain has improved with no movement.    This limits the patient?s ability to do work, ADLs,.  It is impacting their overall quality of life.  Allergies:  Allergies   Allergen Reactions   ? Shellfish Allergy Itching and Swelling   ? Azithromycin Rash   ? ? Takes benadryl with this medication.   ? Erythromycin Rash   ? ? Takes benadryl with this medication.   ?  ?  Current Meds:   Current?Medications           Current Outpatient Prescriptions   Medication Sig Dispense Refill   ? cholecalciferol (VITAMIN D-3) 1000 UNITS Tablet Take 1,000 Units by mouth daily. ? ?   ? cyanocobalamin (VITAMIN B-12) 50 MCG Tablet Take by mouth daily. Pt does not know dose. ? ?   ? levothyroxine (SYNTHROID, LEVOTHROID) 100 MCG PO Tablet Take 1 tablet by mouth daily. 90 tablet 3   ? ondansetron (ZOFRAN) 4 MG PO Tablet Take 1 tablet by mouth daily as needed. 30 tablet 1   ?  No current facility-administered medications for this visit.       ?  ?  Active Problems:      Patient Active Problem List   Diagnosis   ? S/P laparoscopic hysterectomy   ? Benign neoplasm of skin, site unspecified   ? Chronic lymphocytic thyroiditis   ? Erythromycin and other macrolides causing adverse effect in therapeutic use   ? Myalgia and myositis, unspecified   ? Unspecified hypertrophic and atrophic condition of skin   ? Nonspecific abnormal results of thyroid function study   ? Dyspareunia   ? Unspecified symptom associated with female genital organs   ? Vitamin D deficiency   ? Excessive or frequent menstruation   ? Premenopausal menorrhagia   ? Dysmenorrhea   ? Leiomyoma of uterus, unspecified   ? Anemia   ? Follow-up examination, following unspecified surgery   ? Screening examination for pulmonary tuberculosis   ? Bacterial vaginosis   ? Painful bladder spasm

## 2016-10-16 NOTE — H&P
-  VL at 10/09/16 1407 ? ? ?   ? BP Location Right lower arm    -VL at 10/09/16 1407 ? ? ?   ? Position Sitting    -VL at 10/09/16 1407 ? ? ?   ? Pulse 82    -VL at 10/09/16 1407 ? ? ?   ? Pulse Source Monitor    -VL at 10/09/16 1407 ? ? ?   ? Pulse Quality Normal    -VL at 10/09/16 1407 ? ? ?   ? Resp 16    -VL at 10/09/16 1407 ? ? ?   ? Respiration Quality Normal    -VL at 10/09/16 1407 ? ? ?   ? Temp 36 ?C (96.8 ?F)    -VL at 10/09/16 1407 ? ? ?   ? Temperature Source Oral    -VL at 10/09/16 1407 ? ? ?   ? Pain Score SEVEN    -VL at 10/09/16 1407 ? ? ?   ? Location left thumb    -VL at 10/09/16 1407 ? ? ?   ? Education/Communication Barriers?   ? Learning/Communication Barriers? No    -VL at 10/09/16 1407 ? ? ?   ? Fall Risk Assessment   ? Had recent fall / Last 6 months? Yes    -VL at 10/09/16 1407 ? ? ?   ? Does patient have a fear of falling? No    -VL at 10/09/16 1407 ? ? ?          User Key  (r) = Recorded By, (t) = Taken By, (c) = Cosigned By    Initials Name Effective Dates   ? VL [place], [place]07/[place] -          ?  ?  Physical Exam:  BP 105/66 - Pulse 80 - Temp 36.6 ?C (97.8 ?F) (Temporal)  - Resp 17 - SpO2 98%    General Appearance: NAD, AOx3.  Left Hand/Wrist: Skin intact. No erythema, + swelling of the L thumb MCP area. Patient reluctant to move thumb IP and MCP. Thumb IP stable to stress. Thumb MCP stable to stress of the RCL, dorsal capsule, and volar plate. UCL without endpoint at 0' and 30'. + Stener lesion palpated. TTP UCL insertion. No clicking of the thumb A1 with active FPL/EPL. + Sensation thumb tip to deep touch. Motor present to FPL/EPL. CR< 2 sec. No PL tendon present on left, but present on right.  ?  ?  Test Conclusion:  XR HAND LEFT 3 VIEWS  ?  Indications:  Thumb pain  ?  Comparison:  None  ?  IMPRESSION:  Findings/Impression:  Negative for fracture of the left thumb. 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 No erosions.  ?

## 2016-10-16 NOTE — Anesthesia Postprocedure Evaluation
Post Anesthesia Eval    Respiratory function appropriate( normal respiratory rate and oxygen saturation, airway patent )  Cardiovascular function appropriate( normal heart rate and blood pressure )  Mental status appropriate  Patient normothermic  Pain well controlled  No uncontrolled nausea and vomiting  Patient appropriately hydrated  No apparent anesthesia complications  Patient tolerated procedure well

## 2016-10-16 NOTE — H&P
of pain is sharp, constant and throbbing made worse by movement, gripping objects.  Intensity of pain rated as a 8/10.  Treatment has included NSAIDs, narcotics, OTC thumb splint.  Pain has improved with no movement.    This limits the patient?s ability to do work, ADLs,.  It is impacting their overall quality of life.  Allergies:  Allergies   Allergen Reactions   ? Shellfish Allergy Itching and Swelling   ? Azithromycin Rash   ? ? Takes benadryl with this medication.   ? Erythromycin Rash   ? ? Takes benadryl with this medication.   ?  ?  Current Meds:   Current?Medications           Current Outpatient Prescriptions   Medication Sig Dispense Refill   ? cholecalciferol (VITAMIN D-3) 1000 UNITS Tablet Take 1,000 Units by mouth daily. ? ?   ? cyanocobalamin (VITAMIN B-12) 50 MCG Tablet Take by mouth daily. Pt does not know dose. ? ?   ? levothyroxine (SYNTHROID, LEVOTHROID) 100 MCG PO Tablet Take 1 tablet by mouth daily. 90 tablet 3   ? ondansetron (ZOFRAN) 4 MG PO Tablet Take 1 tablet by mouth daily as needed. 30 tablet 1   ?  No current facility-administered medications for this visit.       ?  ?  Active Problems:      Patient Active Problem List   Diagnosis   ? S/P laparoscopic hysterectomy   ? Benign neoplasm of skin, site unspecified   ? Chronic lymphocytic thyroiditis   ? Erythromycin and other macrolides causing adverse effect in therapeutic use   ? Myalgia and myositis, unspecified   ? Unspecified hypertrophic and atrophic condition of skin   ? Nonspecific abnormal results of thyroid function study   ? Dyspareunia   ? Unspecified symptom associated with female genital organs   ? Vitamin D deficiency   ? Excessive or frequent menstruation   ? Premenopausal menorrhagia   ? Dysmenorrhea   ? Leiomyoma of uterus, unspecified   ? Anemia   ? Follow-up examination, following unspecified surgery   ? Screening examination for pulmonary tuberculosis   ? Bacterial vaginosis   ? Painful bladder spasm

## 2016-10-16 NOTE — H&P
-  VL at 10/09/16 1407 ? ? ?   ? BSA (Calculated - sq m) 2.19    -VL at 10/09/16 1407 ? ? ?   ? BP 127/81    -VL at 10/09/16 1407 ? ? ?   ? BP Location Right lower arm    -VL at 10/09/16 1407 ? ? ?   ? Position Sitting    -VL at 10/09/16 1407 ? ? ?   ? Pulse 82    -VL at 10/09/16 1407 ? ? ?   ? Pulse Source Monitor    -VL at 10/09/16 1407 ? ? ?   ? Pulse Quality Normal    -VL at 10/09/16 1407 ? ? ?   ? Resp 16    -VL at 10/09/16 1407 ? ? ?   ? Respiration Quality Normal    -VL at 10/09/16 1407 ? ? ?   ? Temp 36 ?C (96.8 ?F)    -VL at 10/09/16 1407 ? ? ?   ? Temperature Source Oral    -VL at 10/09/16 1407 ? ? ?   ? Pain Score SEVEN    -VL at 10/09/16 1407 ? ? ?   ? Location left thumb    -VL at 10/09/16 1407 ? ? ?   ? Education/Communication Barriers?   ? Learning/Communication Barriers? No    -VL at 10/09/16 1407 ? ? ?   ? Fall Risk Assessment   ? Had recent fall / Last 6 months? Yes    -VL at 10/09/16 1407 ? ? ?   ? Does patient have a fear of falling? No    -VL at 10/09/16 1407 ? ? ?          User Key  (r) = Recorded By, (t) = Taken By, (c) = Cosigned By    Initials Name Effective Dates   ? VL [place], [place] 12/07/15 -          ?  ?  Physical Exam:  WRIST/HAND/ELBOW  ?  Left  General Appearance: deformity and swelling of L thumb AAAOX3 and WD/WN  Incision: None   AROM: L thumb MP Flex 0 IP Flex 10 PIP Ext 0    Point tenderness: TFCC negative TFCC with Ulnar Dev negative TH JT negative 1st Metacarpal positive 5th MC Base negative Rad Styloid negative SL lig negative Snuff Box negative 1st MP positive PIP negative DIP negative Med Epi negative Lat Epi negative   Strength: Grip 0/5 Finger Abduction 4/5 Wrist Ext 5/5 Thumb Abd 0/5  Tinel's: Med negative Ulna @ elbow negative  Sensation: Radialintact Ulnarintact Mediandecreased and at tip of thumb Axillaryintact Musculocutaneousintact Lateral antebrachial cutaneousintact   Reflexes: Hoffman-  Pulses: Rad2+ Ulnar2+  ?  ?  Test Conclusion:  XR L hand 09/29/16

## 2016-10-16 NOTE — Anesthesia Preprocedure Evaluation
Drug Use No       System Evaluation and Medical History:   All outstanding issues resolved History of prolonged awakening after colonoscopy (reduce dose)   HPI: REPAIR OF COLLATERAL LIGAMENT, METACARPOPHALANGEAL OR INTERPHALANGEAL JOINT (Left Hand)   Anesthetic Complications: Sensitive to anesthesia, asked to reduce the doses..   Pulmonary: Negative for pulmonary ROS except otherwise noted.   Neuro/Psych: Negative for neuro/psych ROS except otherwise noted.   Cardiovascular: Negative cardio ROS except otherwise noted.   GI/Hepatic: Negative for GI/hepatic ROS except otherwise noted.   Renal: Negative for renal ROS except otherwise noted   Endo/Other: The patient has a history of thyroid problem. The type is hypothyroidism.       Physical Exam:   Airway: Physical exam of airway reveals, mouth opening of >3 FB with a TM distance of  > 6 cm with full neck ROM, Mallampati score of II.   Pulmonary:  On pulmonary examination breath sounds clear to auscultation.   Cardiovascular: On cardiovascular examination cardiovascular exam normal.   GCS: Patients total GCS score is 15, with 4 for eye response, with 5 for verbal response, and with 6 for motor response.  Obesity: obesity.    Dental: Upon examination dental exam normal      Anesthesia Plan:       Patient has an ASA score of 2 and has been NPO for > 8 hours. Plan for anesthesia technique is general.  With an intravenous type of induction. Anesthetic plan and risks has been discussed with the patient. The vascular access plan include the use of peripheral. The following pulse oximetry, body tempature, ETC02, NIBP and 6-lead EKG will be used for intraoperative monitoring.   Post operative pain will be controled by IV meds.       Anesthesia Attending:   Airway: Physical exam of airway reveals, mouth opening of  >3 FB, with a TM distance of  > 6 cm, with full neck ROM, Mallampati score II.       Pulmonary: On pulmonary examination breath sounds clear to auscultation.

## 2016-10-16 NOTE — H&P
Department of Orthopedics  Pre-Op Interval History and Physical Note    10/16/2016 8:48 AM    Preoperative Diagnosis: Rupture of ulnar collateral ligament of thumb, left, initial encounter [Z61.096E][S63.642A]    Procedure: Procedure(s):  [id number] - REPAIR OF COLLATERAL LIGAMENT, METACARPOPHALANGEAL OR INTERPHALANGEAL JOINT (Left)    Surgeon: Moishe SpiceSurgeon(s) and Role:     Liliane Bade* Kaye, Marc B, MD - Primary    H&P documentation: I have examined the patient and the following are changes in the patient's medical condition since the H&P was done. This includes patient complaining of subjective mild paresthesias along the ulnar aspect of the left thumb for 2 days. She does have 2/2 sensation to the radial and ulnar aspect of the thumb. No change is needed in operative plan. Will monitor patient symptoms after surgery.    I discussed the diagnosis and treatment options with the patient, including the risks, benefits, and alternatives of nonoperative (observation, rest, activity modification, pain medication, injection, splinting, and therapy) and operative treatments.  Risks include k-wire protrusion/infection/loosening with loss of reduction/impalement: infection; nerve/vessel/tendon/ligament injury; tendon adhesions or rupture; joint contracture/stiffness/instability; scarring; scar hypersensitivity; weakness and/or pain of pinch and grip; stiffness, weakness and/or pain of fingers, hand, wrist, forearm, elbow, arm, and shoulder; chronic pain; chronic weakness; RSD/CRPS; loss of function; skin color change; recurrence; recurrent symptoms; persistent symptoms; skin necrosis; amputation; fracture; loss of reduction/fixation; malunion; nonunion; need for further surgery or revision surgery or staged surgery; dvt/PE; MI; stroke; seizure; paralysis; death; anesthesia risks which would be discussed by anesthesia if the patient elected to proceed with surgery.  Also included in the discussion was postoperative elevation,

## 2016-10-16 NOTE — H&P
12/16/12 (Created by Conversion)   ? TUBAL LIGATION ? ?   ? TUBAL LIGATION ? ?   ? Surgery Description: Tubal Ligation;   (Created by Conversion)      ?  ?  Social Hx:   Social?History    Social History   ?        Social History   ? Marital status: Married   ? ? Spouse name: N/A   ? Number of children: N/A   ? Years of education: N/A   ?      Occupational History   ? Not on file.   ?          Social History Main Topics   ? Smoking status: Former Smoker   ? Smokeless tobacco: Never Used   ? Alcohol use 5.0 oz/week    ? ? 1 Glasses of wine per week    ? ? ? Comment: very rarely once a year   ? Drug use: No   ? Sexual activity: Yes   ? ? Partners: Male   ?       Other Topics Concern   ? Not on file   ?  Social History Narrative      ?  ??  Family Hx:   Family?History          Family History   Problem Relation Age of Onset   ? High Blood Pressure Mother ?   ? High Blood Pressure Father ?   ? Cancer Maternal Uncle ?   ? Heart Attack Paternal Uncle ?   ? Heart Attack Paternal Grandfather ?   ? Breast Cancer Neg Hx ?      ?  ?  ROS:   Constitutional: denies fever, chills, or night sweats  HEENT: denies headaches, visual disturbance, or changes in hearing  Respiratory: denies cough, shortness of breath  Cardiovascular: denies chest pain or palpitations  GI: denies nausea, vomiting, diarrhea, constipation, anorexia, and weight loss or gain  Neurological: denies numbness, weakness,   GU: denies dysuria, hematuria, and increase or decrease in urination  Integumentary: denies rashes or other lesions  Psychiatric: denies changes in mood or affect  ?  ?   Vitals    Vital Signs:   VITAL SIGNS (all recorded)   ?   Clinic Vitals     ?? 10/09/16 1407 ? ? ?           ? Amb Encounter Vitals   ? Weight 103 kg (227 lb)    -VL at 10/09/16 1407 ? ? ?   ? Height 1.676 m (5' 6")    -VL at 10/09/16 1407 ? ? ?   ? BMI (Calculated) 36.72    -VL at 10/09/16 1407 ? ? ?   ? BSA (Calculated - sq m) 2.19    -VL at 10/09/16 1407 ? ? ?   ? BP 127/81

## 2016-10-16 NOTE — Anesthesia Preprocedure Evaluation
Drug Use No       System Evaluation and Medical History:     Anesthetic Complications: Negative for anesthesia history ROS except otherwise noted.   Pulmonary: Negative for pulmonary ROS except otherwise noted.   Neuro/Psych: Negative for neuro/psych ROS except otherwise noted.   Cardiovascular: Negative cardio ROS except otherwise noted.   GI/Hepatic: Negative for GI/hepatic ROS except otherwise noted.   Renal: Negative for renal ROS except otherwise noted   Endo/Other: The patient has a history of thyroid problem. The type is hypothyroidism.       Physical Exam:        Anesthesia Plan:            Anesthesia Attending:

## 2016-10-16 NOTE — H&P
-  VL at 10/09/16 1407 ? ? ?   ? BSA (Calculated - sq m) 2.19    -VL at 10/09/16 1407 ? ? ?   ? BP 127/81    -VL at 10/09/16 1407 ? ? ?   ? BP Location Right lower arm    -VL at 10/09/16 1407 ? ? ?   ? Position Sitting    -VL at 10/09/16 1407 ? ? ?   ? Pulse 82    -VL at 10/09/16 1407 ? ? ?   ? Pulse Source Monitor    -VL at 10/09/16 1407 ? ? ?   ? Pulse Quality Normal    -VL at 10/09/16 1407 ? ? ?   ? Resp 16    -VL at 10/09/16 1407 ? ? ?   ? Respiration Quality Normal    -VL at 10/09/16 1407 ? ? ?   ? Temp 36 ?C (96.8 ?F)    -VL at 10/09/16 1407 ? ? ?   ? Temperature Source Oral    -VL at 10/09/16 1407 ? ? ?   ? Pain Score SEVEN    -VL at 10/09/16 1407 ? ? ?   ? Location left thumb    -VL at 10/09/16 1407 ? ? ?   ? Education/Communication Barriers?   ? Learning/Communication Barriers? No    -VL at 10/09/16 1407 ? ? ?   ? Fall Risk Assessment   ? Had recent fall / Last 6 months? Yes    -VL at 10/09/16 1407 ? ? ?   ? Does patient have a fear of falling? No    -VL at 10/09/16 1407 ? ? ?          User Key  (r) = Recorded By, (t) = Taken By, (c) = Cosigned By    Initials Name Effective Dates   ? VL Littles, Verne, MA 12/07/15 -          ?  ?  Physical Exam:  WRIST/HAND/ELBOW  ?  Left  General Appearance: deformity and swelling of L thumb AAAOX3 and WD/WN  Incision: None   AROM: L thumb MP Flex 0 IP Flex 10 PIP Ext 0    Point tenderness: TFCC negative TFCC with Ulnar Dev negative TH JT negative 1st Metacarpal positive 5th MC Base negative Rad Styloid negative SL lig negative Snuff Box negative 1st MP positive PIP negative DIP negative Med Epi negative Lat Epi negative   Strength: Grip 0/5 Finger Abduction 4/5 Wrist Ext 5/5 Thumb Abd 0/5  Tinel's: Med negative Ulna @ elbow negative  Sensation: Radialintact Ulnarintact Mediandecreased and at tip of thumb Axillaryintact Musculocutaneousintact Lateral antebrachial cutaneousintact   Reflexes: Hoffman-  Pulses: Rad2+ Ulnar2+  ?  ?  Test Conclusion:  XR L hand 09/29/16

## 2016-10-16 NOTE — H&P
digital range of motion exercises 10x TID, dressing/splint, and pain medication prn.  All questions and concerns were addressed.   Plan:  All questions and concerns addressed.  Follow up in clinic with Dr. Purnell ShoemakerKaye 10/31/16. Please call [phone] to schedule or confirm your appointment.  Keep surgical dressing clean, dry and intact until your follow up appointment.  Elevate the surgical extremity to reduce swelling.  Complete hand ROM exercises as demonstrated to you in preop, 10 times for 3 sets daily.  Take short course of abx as perscribed. Perscription on chart for Keflex  Take pain medications as perscribed. Do not drive, operate heavy machinery or participate in dangerous activities while taking pain medications. Perscription on chart for Norco 5/325 mg   Call [phone] with questions and concerns.        Department of Orthopedics   Established Patient Visit  ?  ?  ?  Assessment:   ? ? ICD-10-CM ICD-9-CM   1. Thumb injury, left, sequela S69.92XS 908.9   2. Rupture of ulnar collateral ligament of thumb, left, initial encounter G95.621HS63.642A 842.12   ?  ?  Plan:   No orders of the following type(s) were placed in this encounter: Procedures  ?  No orders of the following type(s) were placed in this encounter: Medications.     Will plan for surgery with Dr. Purnell ShoemakerKaye to perform L thumb UCL repair. RBA of surgical and non surgical management discussed with patient. All questions answered.   Case request completed today.   F/u with Dr. Purnell ShoemakerKaye for outpatient surgery.   ?  Referring Physician/Primary Care Physician:  Thayer HeadingsScuderi, Christopher B, DO  ?  Chief Complaint:       Chief Complaint   Patient presents with   ? Hand Injury   ? ? left thumb/ seen at [facility] 09/29/16   ?  HPI:  NEW PATIENT: Patient is a very pleasant 51 y.o. lady RHD who presents today with a 11 day(s) history of left thumb pain.  Onset of pain provoked by ground level fall where patients thumb was 'pulled backward'. Quality

## 2016-10-16 NOTE — H&P
?   Urinary tract infection, site not specified   ? Fibromyalgia   ? Need for hepatitis C screening test   ? Hyperkalemia   ? Hypothyroidism   ? Serum potassium elevated   ? Dyspnea on exertion   ? Atypical chest pain   ? Dyslipidemia   ? Class 1 obesity due to excess calories in adult   ? Nausea   ? Abdominal pain, unspecified abdominal location   ? Thumb injury, left, sequela   ? Rupture of ulnar collateral ligament of thumb, left, initial encounter   ?  ?  PMH:   Past?Medical?History         Past Medical History:   Diagnosis Date   ? Dysmenorrhea ?   ? Feeling of incomplete bladder emptying ?   ? Fibromyalgia ?   ? H/O hyperkalemia ?   ? History of general anesthesia complication ?   ? VERY sensitive to anesthesia   ? Hypothyroid ?   ? Migraines ?   ? hemiplegic migraine   ? Partial tear of Achilles tendon ?   ? left   ? Pelvic pain ?   ? before hysterectomy   ? Thyroid disease ?      ?  ?  PSH:   Past?Surgical?History          Past Surgical History:   Procedure Laterality Date   ? COLONOSCOPY ? ?   ? COLONOSCOPY W/ OR W/O BIOPSY N/A 01/27/2016   ? COLONOSCOPY W/ OR W/O BIOPSY performed by Eid, Amalie F, MD at [facility] OR   ? CYSTOSCOPY ? ?   ? Surgery Description: Cystoscopy (Diagnostic);  Problem Comments: 12/16/12 (Created by Conversion)   ? DILATION AND CURETTAGE OF UTERUS ? ?   ? Surgery Description: Dilation And Curettage;  Problem Comments: for heavy bleeding after second delivery (Created by Conversion)   ? EGD W/ BIOPSY N/A 09/01/2016   ? EGD W/ BIOPSY performed by Malespin, Miguel, MD at [facility] OR   ? HYSTERECTOMY ? ?   ? NASAL SEPTUM SURGERY ? ?   ? Surgery Description: Nasal Septal Deviation Repair;   (Created by Conversion)   ? OTHER SURGICAL HISTORY ? ?   ? Surgery Description: Laparoscopy With Total Hysterectomy;  Problem Comments: excision of endometriosis-12/16/12 (Created by Conversion)   ? SALPINGO-OOPHORECTOMY ? ?   ? Surgery Description: Salpingo-oophorectomy Left Side;  Problem Comments:

## 2016-10-16 NOTE — H&P
digital range of motion exercises 10x TID, dressing/splint, and pain medication prn.  Avoid NSAIDs as may decrease healing.  All questions and concerns were addressed.   Plan:  All questions and concerns addressed.  Follow up in clinic with Dr. Kaye in 2 weeks. Please call [phone] to schedule or confirm your appointment.  Keep surgical dressing clean, dry and intact until your follow up appointment.  Elevate the surgical extremity to reduce swelling.  Complete hand ROM exercises as demonstrated to you in preop, 10 times for 3 sets daily.  Take short course of abx as perscribed. Perscription on chart for Keflex  Take pain medications as perscribed. Do not drive, operate heavy machinery or participate in dangerous activities while taking pain medications. Perscription on chart for Norco 5/325 mg   Call [phone] with questions and concerns.        Department of Orthopedics   Established Patient Visit  ?  ?  ?  Assessment:   ? ? ICD-10-CM ICD-9-CM   1. Thumb injury, left, sequela S69.92XS 908.9   2. Rupture of ulnar collateral ligament of thumb, left, initial encounter S63.642A 842.12   ?  ?  Plan:   No orders of the following type(s) were placed in this encounter: Procedures  ?  No orders of the following type(s) were placed in this encounter: Medications.     Will plan for surgery with Dr. Kaye to perform L thumb UCL repair. RBA of surgical and non surgical management discussed with patient. All questions answered.   Case request completed today.   F/u with Dr. Kaye for outpatient surgery.   ?  Referring Physician/Primary Care Physician:  Scuderi, Christopher B, DO  ?  Chief Complaint:       Chief Complaint   Patient presents with   ? Hand Injury   ? ? left thumb/ seen at [facility] 09/29/16   ?  HPI:  NEW PATIENT: Patient is a very pleasant 50 y.o. lady RHD who presents today with a 11 day(s) history of left thumb pain.  Onset of pain provoked

## 2016-10-16 NOTE — H&P
by ground level fall where patients thumb was 'pulled backward'. Quality of pain is sharp, constant and throbbing made worse by movement, gripping objects.  Intensity of pain rated as a 8/10.  Treatment has included NSAIDs, narcotics, OTC thumb splint.  Pain has improved with no movement.    This limits the patient?s ability to do work, ADLs,.  It is impacting their overall quality of life.  Allergies:  Allergies   Allergen Reactions   ? Shellfish Allergy Itching and Swelling   ? Azithromycin Rash   ? ? Takes benadryl with this medication.   ? Erythromycin Rash   ? ? Takes benadryl with this medication.   ?  ?  Current Meds:   Current?Medications           Current Outpatient Prescriptions   Medication Sig Dispense Refill   ? cholecalciferol (VITAMIN D-3) 1000 UNITS Tablet Take 1,000 Units by mouth daily. ? ?   ? cyanocobalamin (VITAMIN B-12) 50 MCG Tablet Take by mouth daily. Pt does not know dose. ? ?   ? levothyroxine (SYNTHROID, LEVOTHROID) 100 MCG PO Tablet Take 1 tablet by mouth daily. 90 tablet 3   ? ondansetron (ZOFRAN) 4 MG PO Tablet Take 1 tablet by mouth daily as needed. 30 tablet 1   ?  No current facility-administered medications for this visit.       ?  ?  Active Problems:      Patient Active Problem List   Diagnosis   ? S/P laparoscopic hysterectomy   ? Benign neoplasm of skin, site unspecified   ? Chronic lymphocytic thyroiditis   ? Erythromycin and other macrolides causing adverse effect in therapeutic use   ? Myalgia and myositis, unspecified   ? Unspecified hypertrophic and atrophic condition of skin   ? Nonspecific abnormal results of thyroid function study   ? Dyspareunia   ? Unspecified symptom associated with female genital organs   ? Vitamin D deficiency   ? Excessive or frequent menstruation   ? Premenopausal menorrhagia   ? Dysmenorrhea   ? Leiomyoma of uterus, unspecified   ? Anemia   ? Follow-up examination, following unspecified surgery

## 2016-10-16 NOTE — Anesthesia Preprocedure Evaluation
Cardiovascular: Negative for cardio exam except otherwise noted.   GCS: Patients total GCS score is 15, with 4 for eye response, with 5 for verbal response, and with 6 for motor response.I have performed a pre-anesthesia examination, evaluation, and prescribed the anesthesia plan.

## 2016-10-16 NOTE — Anesthesia Preprocedure Evaluation
Department of Anesthesiology  Pre-operative Evaluation Record    Patient Name: Angela Andersen                  Sex: female                  Age: 51 y.o.                  MRN: [id number]     Allergies:   Shellfish allergy; Azithromycin; and Erythromycin    Current Med List   Medication Sig   ? cholecalciferol (VITAMIN D-3) 1000 UNITS Tablet Take 1,000 Units by mouth daily.   ? cyanocobalamin (VITAMIN B-12) 50 MCG Tablet Take by mouth daily. Pt does not know dose.   ? levothyroxine (SYNTHROID, LEVOTHROID) 100 MCG PO Tablet Take 1 tablet by mouth daily.       Prior Surgeries:     Past Surgical History:   Procedure Laterality Date   ? COLONOSCOPY     ? COLONOSCOPY W/ OR W/O BIOPSY N/A 01/27/2016    COLONOSCOPY W/ OR W/O BIOPSY performed by Noel GeroldEid, Amalie F[facility]   ? CYSTOSCOPY      Surgery Description: Cystoscopy (Diagnostic);  Problem Comments: 12/16/12 (Created by Conversion)   ? DILATION AND CURETTAGE OF UTERUS      Surgery Description: Dilation And Curettage;  Problem Comments: for heavy bleeding after second delivery (Created by Conversion)   ? EGD W/ BIOPSY N/A 09/01/2016    EGD W/ BIOPSY [facility] Lenis NoonMalespin, Miguel[facility]   ? HYSTERECTOMY     ? NASAL SEPTUM SURGERY      Surgery Description: Nasal Septal Deviation Repair;   (Created by Conversion)   ? OTHER SURGICAL HISTORY      Surgery Description: Laparoscopy With Total Hysterectomy;  Problem Comments: excision of endometriosis-12/16/12 (Created by Conversion)   ? SALPINGO-OOPHORECTOMY      Surgery Description: Salpingo-oophorectomy Left Side;  Problem Comments: 12/16/12 (Created by Conversion)   ? TUBAL LIGATION     ? TUBAL LIGATION      Surgery Description: Tubal Ligation;   (Created by Conversion)       Social   ETOH:       Alcohol Use   ? 5.0 oz/week   ? 1 Glasses of wine per week     Comment: very rarely once a year     Tobacco:   History   Smoking Status   ? Former Smoker   Smokeless Tobacco   ? Never Used     Drugs:   History

## 2016-10-16 NOTE — H&P
digital range of motion exercises 10x TID, dressing/splint, and pain medication prn.  All questions and concerns were addressed.   Plan:  All questions and concerns addressed.  Follow up in clinic with Dr. Kaye 10/31/16. Please call [phone] to schedule or confirm your appointment.  Keep surgical dressing clean, dry and intact until your follow up appointment.  Elevate the surgical extremity to reduce swelling.  Complete hand ROM exercises as demonstrated to you in preop, 10 times for 3 sets daily.  Take short course of abx as perscribed. Perscription on chart for Keflex  Take pain medications as perscribed. Do not drive, operate heavy machinery or participate in dangerous activities while taking pain medications. Perscription on chart for Norco 5/325 mg   Call [phone] with questions and concerns.        Department of Orthopedics   Established Patient Visit  ?  ?  ?  Assessment:   ? ? ICD-10-CM ICD-9-CM   1. Thumb injury, left, sequela S69.92XS 908.9   2. Rupture of ulnar collateral ligament of thumb, left, initial encounter S63.642A 842.12   ?  ?  Plan:   No orders of the following type(s) were placed in this encounter: Procedures  ?  No orders of the following type(s) were placed in this encounter: Medications.     Will plan for surgery with Dr. Kaye to perform L thumb UCL repair. RBA of surgical and non surgical management discussed with patient. All questions answered.   Case request completed today.   F/u with Dr. Kaye for outpatient surgery.   ?  Referring Physician/Primary Care Physician:  Scuderi, Christopher B, DO  ?  Chief Complaint:       Chief Complaint   Patient presents with   ? Hand Injury   ? ? left thumb/ seen at [facility] 09/29/16   ?  HPI:  NEW PATIENT: Patient is a very pleasant 51 y.o. lady RHD who presents today with a 11 day(s) history of left thumb pain.  Onset of pain provoked by ground level fall where patients thumb was 'pulled backward'. Quality

## 2016-10-16 NOTE — H&P
12/16/12 (Created by Conversion)   ? TUBAL LIGATION ? ?   ? TUBAL LIGATION ? ?   ? Surgery Description: Tubal Ligation;   (Created by Conversion)      ?  ?  Social Hx:   Social?History    Social History   ?        Social History   ? Marital status: Married   ? ? Spouse name: N/A   ? Number of children: N/A   ? Years of education: N/A   ?      Occupational History   ? Not on file.   ?          Social History Main Topics   ? Smoking status: Former Smoker   ? Smokeless tobacco: Never Used   ? Alcohol use 5.0 oz/week    ? ? 1 Glasses of wine per week    ? ? ? Comment: very rarely once a year   ? Drug use: No   ? Sexual activity: Yes   ? ? Partners: Male   ?       Other Topics Concern   ? Not on file   ?  Social History Narrative      ?  ??  Family Hx:   Family?History          Family History   Problem Relation Age of Onset   ? High Blood Pressure Mother ?   ? High Blood Pressure Father ?   ? Cancer Maternal Uncle ?   ? Heart Attack Paternal Uncle ?   ? Heart Attack Paternal Grandfather ?   ? Breast Cancer Neg Hx ?      ?  ?  ROS:   Constitutional: denies fever, chills, or night sweats  HEENT: denies headaches, visual disturbance, or changes in hearing  Respiratory: denies cough, shortness of breath  Cardiovascular: denies chest pain or palpitations  GI: denies nausea, vomiting, diarrhea, constipation, anorexia, and weight loss or gain  Neurological: denies numbness, weakness,   GU: denies dysuria, hematuria, and increase or decrease in urination  Integumentary: denies rashes or other lesions  Psychiatric: denies changes in mood or affect  ?  ?   Vitals    Vital Signs:   VITAL SIGNS (all recorded)   ?   Clinic Vitals     ?? 10/09/16 1407 ? ? ?           ? Amb Encounter Vitals   ? Weight 103 kg (227 lb)    -VL at 10/09/16 1407 ? ? ?   ? Height 1.676 m (5\' 6" )    -VL at 10/09/16 1407 ? ? ?   ? BMI (Calculated) 36.72    -VL at 10/09/16 1407 ? ? ?   ? BSA (Calculated - sq m) 2.19    -VL at 10/09/16 1407 ? ? ?   ? BP 127/81

## 2016-10-16 NOTE — H&P
Department of Orthopedics  Pre-Op Interval History and Physical Note    10/16/2016 8:48 AM    Preoperative Diagnosis: Rupture of ulnar collateral ligament of thumb, left, initial encounter [S63.642A]    Procedure: Procedure(s):  26540 - REPAIR OF COLLATERAL LIGAMENT, METACARPOPHALANGEAL OR INTERPHALANGEAL JOINT (Left)    Surgeon: Surgeon(s) and Role:     * Kaye, Marc B, MD - Primary    H&P documentation: I have examined the patient and the following are changes in the patient's medical condition since the H&P was done. This includes patient complaining of subjective mild paresthesias along the ulnar aspect of the left thumb for 2 days. She does have 2/2 sensation to the radial and ulnar aspect of the thumb. No change is needed in operative plan. Will monitor patient symptoms after surgery.    I discussed the diagnosis and treatment options with the patient, including the risks, benefits, and alternatives of nonoperative (observation, rest, activity modification, pain medication, injection, splinting, and therapy) and operative treatments.  Risks include k-wire protrusion/infection/loosening with loss of reduction/impalement: infection; nerve/vessel/tendon/ligament injury; tendon adhesions or rupture; joint contracture/stiffness/instability; scarring; scar hypersensitivity; weakness and/or pain of pinch and grip; stiffness, weakness and/or pain of fingers, hand, wrist, forearm, elbow, arm, and shoulder; chronic pain; chronic weakness; RSD/CRPS; loss of function; skin color change; recurrence; recurrent symptoms; persistent symptoms; skin necrosis; amputation; fracture; loss of reduction/fixation; malunion; nonunion; need for further surgery or revision surgery or staged surgery; dvt/PE; MI; stroke; seizure; paralysis; death; anesthesia risks which would be discussed by anesthesia if the patient elected to proceed with surgery.  Also included in the discussion was postoperative elevation,

## 2016-10-16 NOTE — H&P
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  Procedure:Procedures  None                ?  Patient Counseling & Education:  Discussed diagnosis and treatment options with the patient. We discussed the surgery to entail L thumb UCL repair to be done by Dr. Purnell ShoemakerKaye. We discussed the risks and benefits of surgery including infection, nerve or vessel injury, arthrofibrosis, continued pain and the need for further surgery. The patient understands the risks and benefits and wishes to proceed with surgery.  ?    Vella KohlerLuke Rasmussen, MD  2/12/20188:48 AM

## 2016-10-16 NOTE — H&P
by ground level fall where patients thumb was 'pulled backward'. Quality of pain is sharp, constant and throbbing made worse by movement, gripping objects.  Intensity of pain rated as a 8/10.  Treatment has included NSAIDs, narcotics, OTC thumb splint.  Pain has improved with no movement.    This limits the patient?s ability to do work, ADLs,.  It is impacting their overall quality of life.  Allergies:  Allergies   Allergen Reactions   ? Shellfish Allergy Itching and Swelling   ? Azithromycin Rash   ? ? Takes benadryl with this medication.   ? Erythromycin Rash   ? ? Takes benadryl with this medication.   ?  ?  Current Meds:   Current?Medications           Current Outpatient Prescriptions   Medication Sig Dispense Refill   ? cholecalciferol (VITAMIN D-3) 1000 UNITS Tablet Take 1,000 Units by mouth daily. ? ?   ? cyanocobalamin (VITAMIN B-12) 50 MCG Tablet Take by mouth daily. Pt does not know dose. ? ?   ? levothyroxine (SYNTHROID, LEVOTHROID) 100 MCG PO Tablet Take 1 tablet by mouth daily. 90 tablet 3   ? ondansetron (ZOFRAN) 4 MG PO Tablet Take 1 tablet by mouth daily as needed. 30 tablet 1   ?  No current facility-administered medications for this visit.       ?  ?  Active Problems:      Patient Active Problem List   Diagnosis   ? S/P laparoscopic hysterectomy   ? Benign neoplasm of skin, site unspecified   ? Chronic lymphocytic thyroiditis   ? Erythromycin and other macrolides causing adverse effect in therapeutic use   ? Myalgia and myositis, unspecified   ? Unspecified hypertrophic and atrophic condition of skin   ? Nonspecific abnormal results of thyroid function study   ? Dyspareunia   ? Unspecified symptom associated with female genital organs   ? Vitamin D deficiency   ? Excessive or frequent menstruation   ? Premenopausal menorrhagia   ? Dysmenorrhea   ? Leiomyoma of uterus, unspecified   ? Anemia   ? Follow-up examination, following unspecified surgery

## 2016-10-17 NOTE — Immediate Post-Operative Note
including thumb.  I discussed with the patient and her husband the operatively findings, procedures performed, and again the postoperative care (elevation, digital ROM exercises, pain medication prn severe pain, dressing care, and splinting).  No driving or operating machinery or alcohol consumption while taking narcotic medication.  The patient and her husband acknowledged understanding of the operative findings, procedures, and postoperative care.

## 2016-10-17 NOTE — Immediate Post-Operative Note
[facility]  Immediate Post-Operative Note      Case Record #: [id number]  Angela Andersen  05/12/1966, 51 y.o., female    Case Date: 10/16/2016    Staff:  Surgeon(s) and Role:     * Kaye, Marc B, MD - Primary     * Rasmussen, Luke, MD - Resident - Assisting  Anesthesiologist: Ianov, Igor, MD  CRNA: Patalinghug, Henry F, CRNA      Pre-Op Diagnosis:   Rupture of ulnar collateral ligament of thumb, left, initial encounter [S63.642A]    Procedure(s):  26540 - REPAIR OF COLLATERAL LIGAMENT, METACARPOPHALANGEAL OR INTERPHALANGEAL JOINT (Left)    Post-Op Diagnosis Codes:     * Rupture of ulnar collateral ligament of thumb, left, initial encounter [S63.642A]      Teaching MD Documentation  1. A Resident/ACGME fellow participated in this case: Yes   2. Participating Resident/ACGME fellow was qualified for case type: Yes    3. Teaching surgeon is present entire case (including opening & closing): Yes          Findings/Description: L: thumb MCP ulnar collateral ligament tear s/p L thumb MCP ulnar collateral ligament primary repair with suture anchor    Specimens: None    SCD's applied: yes    EBL: <3 mls    Plan:  Follow up in clinic with Dr. Kaye in 2 weeks. Please call [phone] to schedule or confirm your appointment.  Keep surgical dressing and thumb spica splint clean, dry and intact until your follow up appointment.  Elevate the surgical extremity to reduce swelling.  Complete hand ROM exercises as demonstrated to you in preop, 10 times for 3 sets daily.  Take short course of abx as perscribed. Perscription on chart for keflex.  Take pain medications as perscribed. Do not drive, operate heavy machinery or participate in dangerous activities while taking pain medications. Perscription on chart for Norco 5/325 mg  Call [phone] with questions and concerns.    I examined the patient in the PACU and the patient was doing well with good perfusion of the digits.  I discussed with the patient and her

## 2016-10-17 NOTE — Immediate Post-Operative Note
[facility]  [facility] Post-[facility]ative [facility]      Case Record #: [id number]  Angela Andersen  1966/08/30, 51 y.o., female    Case Date: 10/16/2016    Staff:  Surgeon(s) and Role:     * Liliane BadeKaye, Marc B, MD - Primary     * Vella Kohlerasmussen, Luke, MD - Resident - Assisting  Anesthesiologist: Rivka SaferIanov, Igor, MD  CRNA: Reece PackerPatalinghug, Henry F, CRNA      PREOPERATIVE DIAGNOSIS:  Left thumb ulnar collateral ligament rupture, with Stener lesion.     POSTOPERATIVE DIAGNOSIS:  Left thumb ulnar collateral ligament rupture, with Stener lesion.     PROCEDURE:  Left thumb ulnar collateral ligament repair, primary with adductor advancement (45409(26542).      Teaching MD Documentation  1. A Resident/ACGME fellow participated in this case: Yes   2. Participating Resident/ACGME fellow was qualified for case type: Yes    3. Teaching surgeon is present entire case (including opening & closing): Yes          Findings/Description: L: thumb MCP ulnar collateral ligament tear s/p L thumb MCP ulnar collateral ligament primary repair with suture anchor    Specimens: None    SCD's applied: yes    EBL: <3 mls    Plan:  Follow up in clinic with Dr. Purnell ShoemakerKaye 10/31/16. Please call [phone] to schedule or confirm your appointment.  Keep surgical dressing and thumb spica splint clean, dry and intact until your follow up appointment.  Elevate the surgical extremity to reduce swelling.  Complete hand ROM exercises as demonstrated to you in preop, 10 times for 3 sets daily.  Take short course of abx as perscribed. Perscription on chart for keflex.  Take pain medications as perscribed. Do not drive, operate heavy machinery or participate in dangerous activities while taking pain medications. Perscription on chart for Norco 5/325 mg  Call [phone] with questions and concerns.    I examined the patient in the PACU and the patient was doing well with good perfusion of the digits, comfortable, and perfusion of her digits

## 2016-10-17 NOTE — Op Note
to provide additional support for healing of the repair.  The wound was copiously irrigated out.  Postop timeout was performed.  The whole operative team agreed.  Again, the wound was copiously irrigated out and the skin was closed with 4-0 nylon suture.  Extremity was washed and dried.  Tourniquet had been released.  All 5 digits including the left thumb pinked up with cap refill less than 2 seconds and became warm.  Xeroform, 4 x 4, Webril, and a thumb spica splint with the IP free.  The patient was then awakened, taken to PACU in stable condition.    CONDITION:  Stable.    IMPLANTS:  Smith & Nephew small joint suture anchor.    SPECIMENS:  None.    ESTIMATED BLOOD LOSS:  Less than 3mL.    I was present and scrubbed through the entire case.      Marc B Kaye, MD    MK/NTS  DD:  10/16/2016 10:28:22  DT:  10/16/2016 11:34:44  Job#:  [id number]  Conf#:  [id number]

## 2016-10-17 NOTE — Immediate Post-Operative Note
[facility]      Case Record #: [id number]  Angela Andersen  1966-02-05, 51 y.o., female    Case Date: 10/16/2016    Staff:  Surgeon(s) and Role:     * Liliane BadeKaye, Marc B, MD - Primary     * Vella Kohlerasmussen, Luke, MD - Resident - Assisting  Anesthesiologist: Rivka SaferIanov, Igor, MD  CRNA: Reece PackerPatalinghug, Henry F, CRNA      Pre-Op Diagnosis:   Rupture of ulnar collateral ligament of thumb, left, initial encounter [A54.098J][S63.642A]    Procedure(s):  [phone] [id number] - REPAIR OF COLLATERAL LIGAMENT, METACARPOPHALANGEAL OR INTERPHALANGEAL JOINT (Left)    Post-Op Diagnosis Codes:     * Rupture of ulnar collateral ligament of thumb, left, initial encounter [W29.562Z][S63.642A]      Teaching MD Documentation  1. A Resident/ACGME fellow participated in this case: Yes   2. Participating Resident/ACGME fellow was qualified for case type: Yes    3. Teaching surgeon is present entire case (including opening & closing): Yes          Findings/Description: L: thumb MCP ulnar collateral ligament tear s/p L thumb MCP ulnar collateral ligament primary repair with suture anchor    Specimens: None    SCD's applied: yes    EBL: <3 mls    Plan:  Follow up in clinic with Dr. Purnell ShoemakerKaye in 2 weeks. Please call [phone] to schedule or confirm your appointment.  Keep surgical dressing and thumb spica splint clean, dry and intact until your follow up appointment.  Elevate the surgical extremity to reduce swelling.  Complete hand ROM exercises as demonstrated to you in preop, 10 times for 3 sets daily.  Take short course of abx as perscribed. Perscription on chart for keflex.  Take pain medications as perscribed. Do not drive, operate heavy machinery or participate in dangerous activities while taking pain medications. Perscription on chart for Norco 5/325 mg  Call [phone] with questions and concerns.    I examined the patient in the PACU and the patient was doing well with good perfusion of the digits.  I discussed with the patient and her

## 2016-10-17 NOTE — Immediate Post-Operative Note
husband the operatively findings, procedures performed, and again the postoperative care (elevation, digital ROM exercises, pain medication prn severe pain, dressing care, and splinting).  No driving or operating machinery or alcohol consumption while taking narcotic medication.  The patient and her husband acknowledged understanding of the operative findings, procedures, and postoperative care.

## 2016-10-17 NOTE — Op Note
and no traumatic arthritic change was present.  The joint was irrigated out.  Next, a drill was placed at the attachment area to the ulnar collateral ligament insertion followed by a Smith [employer] Nephew small joint suture anchor and then the ulnar collateral ligament was primarily repaired in a locking fashion.  The adductor tendon was then sutured in to provide additional support for healing of the repair.  The wound was copiously irrigated out.  Postop timeout was performed.  The whole operative team agreed.  Again, the wound was copiously irrigated out and the skin was closed with 4-0 nylon suture.  Extremity was washed and dried.  Tourniquet had been released.  All 5 digits including the left thumb pinked up with cap refill less than 2 seconds and became warm.  Xeroform, 4 x 4, Webril, and a thumb spica splint with the IP free.  The patient was then awakened, taken to PACU in stable condition.    CONDITION:  Stable.    IMPLANTS:  Smith [employer] Nephew small joint suture anchor.    SPECIMENS:  None.    ESTIMATED BLOOD LOSS:  Less than 3mL.    I was present and scrubbed through the entire case.      Marc B Kaye, MD    MK/NTS  DD:  10/16/2016 10:28:22  DT:  10/16/2016 11:34:44  Job#:  [id number]  Conf#:  [id number]

## 2016-10-17 NOTE — Op Note
was reviewed with the patient and her husband.  They both agreed and the patient signed.  This was witnessed.  Side and site marking reviewed with the patient.  The patient and her husband agreed.  This was witnessed.  Anesthesia then discussed anesthetic options with the patient and her husband.  They decided for local/digital block plus general.  The patient was then taken back to the operating room.  With the operative team present and the patient awake, a timeout was performed that includes consent as well as side and site marking.  The patient agreed and the whole operative team agreed.  The patient was supine, bony prominences were padded, safety belt was placed, SCDs were on and functioning.  Preinductive antibiotics were administered.  The patient then underwent general anesthesia.  A second timeout was performed.  The whole operative team agreed.  The patient was then prepped with triple prep and draped in the usual sterile fashion.  A third timeout was performed.  The whole operative team agreed.  The patient did not have a palmaris longus as well.  Centered over the left thumb MCP joint, dorsal longitudinal incision sharply through skin, blunt dissection down onto the extensor pollicis longus tendon.  Thick skin flaps were created.  Dissection down onto the ulnar aspect of the thumb.  A Stener lesion was abductors genet salient was easily noted with a rupture of the distal end of the ulnar collateral ligament going around the adductor tendon.  The adductor was then taken off the extensor pollicis longus tendon on the ulnar side carefully elevated up.  The joint was easily opened and ***  arthritic change that was present, mainly on the ulnar side of the joint.  The joint was irrigated out.  Next, a drill was placed with a Smith [employer] Nephew small joint suture anchor into the insertion point and then the ulnar collateral ligament was primarily repaired.  The adductor tendon was then sutured in

## 2016-10-17 NOTE — Op Note
PATIENT NAME:  Angela ElyNNETTE Raso  MRN:   [id number]  DATE OF SERVICE:  10/16/2016    [facility].    ATTENDING SURGEON:  Sheliah PlaneMarc Kaye, MD    RESIDENT ASSISTANT:  Vella KohlerLuke Rasmussen, MD    ANESTHESIOLOGIST:  Rivka SaferIgor Ianov, MD    ANESTHESIA TYPE:  Digital block/local plus general.    PREOPERATIVE DIAGNOSIS:  Left thumb ulnar collateral ligament rupture, with Stener lesion.     POSTOPERATIVE DIAGNOSIS:  Left thumb ulnar collateral ligament rupture, with Stener lesion.     PROCEDURE:  Left thumb ulnar collateral ligament repair, primary with adductor advancement (54098(26542).    INDICATION:  The patient sustained a trip and fall onto her left thumb, developed pain and swelling in the area.  She was seen by [facility] and was noted to have a suspected ulnar collateral ligament rupture of her left thumb.  Dr. Hetty ElyBrunelli of [facility] then contacted myself, Dr. Purnell ShoemakerKaye, about her hand to discuss the patient.  The risks, benefits, alternatives were discussed with the patient regarding treatment options at that time.  All questions and concerns were addressed.  The patient acknowledged understanding risks, benefits and alternatives and wanted surgical treatment.  The patient was then scheduled for surgery.    PROCEDURE:  The patient was identified in Preoperative Holding.  She was examined and I could definitely palpate a Stener lesion that was present.  She was completely unstable at the ulnar collateral ligament at both 0 and 30 degrees completely opening up without any sort of end point.  The dorsal, volar, and the radial collateral ligaments where very stable.  Her IP joint was stable on exam as well where she had some complaints.  She did not have any clicking or pain with flexion and extension of the thumb, though was reluctant to move the entire thumb secondary to pain at the MCP joint area.  The patient did not have a palmaris longus.  I discussed

## 2016-10-17 NOTE — Immediate Post-Operative Note
husband the operatively findings, procedures performed, and again the postoperative care (elevation, digital ROM exercises, pain medication prn severe pain, dressing care, and splinting).  No driving or operating machinery or alcohol consumption while taking narcotic medication.  The patient and her husband acknowledged understanding of the operative findings, procedures, and postoperative care.

## 2016-10-17 NOTE — Op Note
risks, benefits, and alternatives with the patient and husband regarding treatment options.  These are outlined in the consent.  All questions and concerns were addressed.  The patient and her husband acknowledged understanding of these risks, benefits and alternatives and wanted surgical treatment.  Consent was reviewed with the patient and her husband.  They both agreed and the patient signed.  This was witnessed.  Side and site marking reviewed with the patient and her husband.  The patient and her husband agreed.  This was witnessed.  Anesthesia then discussed anesthetic options with the patient and her husband.  They decided for local/digital block plus general.  The patient was then taken back to the operating room.  With the operative team present and the patient awake, a timeout was performed that includes consent as well as side and site marking.  The patient agreed and the whole operative team agreed.  The patient was supine, bony prominences were padded, safety belt was placed, SCDs were on and functioning.  Preinductive antibiotics were administered.  The patient then underwent general anesthesia.  A second timeout was performed.  The whole operative team agreed.  The patient was then prepped with triple prep and draped in the usual sterile fashion.  A third timeout was performed.  The whole operative team agreed.  Centered over the left thumb MCP joint, dorsal longitudinal incision sharply through skin, blunt dissection down onto the extensor pollicis longus tendon.  Thick skin flaps were created.  Dissection down onto the ulnar aspect of the thumb.  A Stener lesion was easily noted, with a rupture of the distal end of the ulnar collateral ligament blocked by the adductor tendon.  The adductor was then taken off the extensor pollicis longus tendon on the ulnar side carefully elevated up.  The ulnar sided neurovascular  Structures were protected.  The MCP joint was easily opened

## 2016-10-17 NOTE — Immediate Post-Operative Note
[facility]  Immediate Post-Operative Note      Case Record #: [id number]  Angela Andersen  08/29/1966, 50 y.o., female    Case Date: 10/16/2016    Staff:  Surgeon(s) and Role:     * Kaye, Marc B, MD - Primary     * Rasmussen, Luke, MD - Resident - Assisting  Anesthesiologist: Ianov, Igor, MD  CRNA: Patalinghug, Henry F, CRNA      PREOPERATIVE DIAGNOSIS:  Left thumb ulnar collateral ligament rupture, with Stener lesion.     POSTOPERATIVE DIAGNOSIS:  Left thumb ulnar collateral ligament rupture, with Stener lesion.     PROCEDURE:  Left thumb ulnar collateral ligament repair, primary with adductor advancement (26542).      Teaching MD Documentation  1. A Resident/ACGME fellow participated in this case: Yes   2. Participating Resident/ACGME fellow was qualified for case type: Yes    3. Teaching surgeon is present entire case (including opening & closing): Yes          Findings/Description: L: thumb MCP ulnar collateral ligament tear s/p L thumb MCP ulnar collateral ligament primary repair with suture anchor    Specimens: None    SCD's applied: yes    EBL: <3 mls    Plan:  Follow up in clinic with Dr. Kaye 10/31/16. Please call [phone] to schedule or confirm your appointment.  Keep surgical dressing and thumb spica splint clean, dry and intact until your follow up appointment.  Elevate the surgical extremity to reduce swelling.  Complete hand ROM exercises as demonstrated to you in preop, 10 times for 3 sets daily.  Take short course of abx as perscribed. Perscription on chart for keflex.  Take pain medications as perscribed. Do not drive, operate heavy machinery or participate in dangerous activities while taking pain medications. Perscription on chart for Norco 5/325 mg  Call [phone] with questions and concerns.    I examined the patient in the PACU and the patient was doing well with good perfusion of the digits, comfortable, and perfusion of her digits

## 2016-10-17 NOTE — Op Note
was reviewed with the patient and her husband.  They both agreed and the patient signed.  This was witnessed.  Side and site marking reviewed with the patient.  The patient and her husband agreed.  This was witnessed.  Anesthesia then discussed anesthetic options with the patient and her husband.  They decided for local/digital block plus general.  The patient was then taken back to the operating room.  With the operative team present and the patient awake, a timeout was performed that includes consent as well as side and site marking.  The patient agreed and the whole operative team agreed.  The patient was supine, bony prominences were padded, safety belt was placed, SCDs were on and functioning.  Preinductive antibiotics were administered.  The patient then underwent general anesthesia.  A second timeout was performed.  The whole operative team agreed.  The patient was then prepped with triple prep and draped in the usual sterile fashion.  A third timeout was performed.  The whole operative team agreed.  The patient did not have a palmaris longus as well.  Centered over the left thumb MCP joint, dorsal longitudinal incision sharply through skin, blunt dissection down onto the extensor pollicis longus tendon.  Thick skin flaps were created.  Dissection down onto the ulnar aspect of the thumb.  A Stener lesion was abductors genet salient was easily noted with a rupture of the distal end of the ulnar collateral ligament going around the adductor tendon.  The adductor was then taken off the extensor pollicis longus tendon on the ulnar side carefully elevated up.  The joint was easily opened and ***  arthritic change that was present, mainly on the ulnar side of the joint.  The joint was irrigated out.  Next, a drill was placed with a Smith [employer] Nephew small joint suture anchor into the insertion point and then the ulnar collateral ligament was primarily repaired.  The adductor tendon was then sutured in

## 2016-10-17 NOTE — Op Note
PATIENT NAME:  Angela Andersen  MRN:   [id number]  DATE OF SERVICE:  10/16/2016    [facility]    ATTENDING SURGEON:  Sheliah PlaneMarc Kaye, MD    RESIDENT ASSISTANT:  Vella KohlerLuke Rasmussen, MD    ANESTHESIOLOGIST:  Rivka SaferIgor Ianov, MD    ANESTHESIA TYPE:  Digital block plus local plus general.    PREOPERATIVE DIAGNOSIS:  Left thumb ulnar collateral ligament rupture, with Stener lesion.     PROCEDURE:  Left thumb ulnar collateral ligament repair, primary with adductor advancement (54098(26542).    INDICATION:  The patient sustained a trip and fall onto her left thumb, developed pain and swelling in the area.  She was seen by [facility] and was noted to have a suspected ulnar collateral ligament rupture of her left thumb.  The risks, benefits, alternatives were discussed with the patient regarding treatment options at that time.  .  All questions and concerns were addressed.  The patient acknowledged understanding risks, benefits and alternatives and wanted surgical treatment.  Dr. Hetty ElyBrunelli at [facility] then contacted myself, Dr. Purnell ShoemakerKaye, about her hand to discuss the patient.  The patient was then scheduled for surgery.    PROCEDURE:  The patient was identified in Preoperative Holding.  She was examined and I could definitely palpate a Stener lesion that was present.  She was completely unstable at the ulnar collateral ligament both 0 and 30 degrees completely opening up without any sort of end point.  A dorsal volar and the radial collateral ligaments where very stable.  Her IP joint was stable on exam as well where she had some complaints.  I discussed risks, benefits, and alternatives with the patient and husband regarding treatment options.  These are outlined in the consent.  All questions and concerns were addressed.  The patient acknowledged understanding of these risks, benefits and alternatives and wanted surgical treatment.  Consent

## 2016-10-17 NOTE — Immediate Post-Operative Note
including thumb.  I discussed with the patient and her husband the operatively findings, procedures performed, and again the postoperative care (elevation, digital ROM exercises, pain medication prn severe pain, dressing care, and splinting).  No driving or operating machinery or alcohol consumption while taking narcotic medication.  The patient and her husband acknowledged understanding of the operative findings, procedures, and postoperative care.

## 2016-10-17 NOTE — Immediate Post-Operative Note
[facility]      Case Record #: [id number]  Angela Andersen  Jul 17, 1966, 51 y.o., female    Case Date: 10/16/2016    Staff:  Surgeon(s) and Role:     * Liliane BadeKaye, Marc B, MD - Primary     * Vella Kohlerasmussen, Luke, MD - Resident - Assisting  Anesthesiologist: Rivka SaferIanov, Igor, MD  CRNA: Reece PackerPatalinghug, Henry F, CRNA      Pre-Op Diagnosis:   Rupture of ulnar collateral ligament of thumb, left, initial encounter [A54.098J][S63.642A]    Procedure(s):  [id number] - REPAIR OF COLLATERAL LIGAMENT, METACARPOPHALANGEAL OR INTERPHALANGEAL JOINT (Left)    Post-Op Diagnosis Codes:     * Rupture of ulnar collateral ligament of thumb, left, initial encounter [W29.562Z][S63.642A]      Teaching MD Documentation  1. A Resident/ACGME fellow participated in this case: Yes   2. Participating Resident/ACGME fellow was qualified for case type: Yes    3. Teaching surgeon is present entire case (including opening & closing): Yes          Findings/Description: L: thumb MCP ulnar collateral ligament tear s/p L thumb MCP ulnar collateral ligament primary repair with suture anchor    Specimens: None    SCD's applied: yes    EBL: <3 mls    Plan:  Follow up in clinic with Dr. Purnell ShoemakerKaye in 2 weeks. Please call [phone] to schedule or confirm your appointment.  Keep surgical dressing and thumb spica splint clean, dry and intact until your follow up appointment.  Elevate the surgical extremity to reduce swelling.  Complete hand ROM exercises as demonstrated to you in preop, 10 times for 3 sets daily.  Take short course of abx as perscribed. Perscription on chart for keflex.  Take pain medications as perscribed. Do not drive, operate heavy machinery or participate in dangerous activities while taking pain medications. Perscription on chart for Norco 5/325 mg  Call [phone] with questions and concerns.

## 2016-10-17 NOTE — Op Note
PATIENT NAME:  Angela Andersen  MRN:   [id number]  DATE OF SERVICE:  10/16/2016    LOCATION:  [facility].    ATTENDING SURGEON:  Marc Kaye, MD    RESIDENT ASSISTANT:  Luke Rasmussen, MD    ANESTHESIOLOGIST:  Igor Ianov, MD    ANESTHESIA TYPE:  Digital block/local plus general.    PREOPERATIVE DIAGNOSIS:  Left thumb ulnar collateral ligament rupture, with Stener lesion.     POSTOPERATIVE DIAGNOSIS:  Left thumb ulnar collateral ligament rupture, with Stener lesion.     PROCEDURE:  Left thumb ulnar collateral ligament repair, primary with adductor advancement (26542).    INDICATION:  The patient sustained a trip and fall onto her left thumb, developed pain and swelling in the area.  She was seen by [facility] and was noted to have a suspected ulnar collateral ligament rupture of her left thumb.  Dr. Brunelli of [facility] then contacted myself, Dr. Kaye, about her hand to discuss the patient.  The risks, benefits, alternatives were discussed with the patient regarding treatment options at that time.  All questions and concerns were addressed.  The patient acknowledged understanding risks, benefits and alternatives and wanted surgical treatment.  The patient was then scheduled for surgery.    PROCEDURE:  The patient was identified in Preoperative Holding.  She was examined and I could definitely palpate a Stener lesion that was present.  She was completely unstable at the ulnar collateral ligament at both 0 and 30 degrees completely opening up without any sort of end point.  The dorsal, volar, and the radial collateral ligaments where very stable.  Her IP joint was stable on exam as well where she had some complaints.  She did not have any clicking or pain with flexion and extension of the thumb, though was reluctant to move the entire thumb secondary to pain at the MCP joint area.  The patient did not have a palmaris longus.  I discussed

## 2016-10-17 NOTE — Op Note
risks, benefits, and alternatives with the patient and husband regarding treatment options.  These are outlined in the consent.  All questions and concerns were addressed.  The patient and her husband acknowledged understanding of these risks, benefits and alternatives and wanted surgical treatment.  Consent was reviewed with the patient and her husband.  They both agreed and the patient signed.  This was witnessed.  Side and site marking reviewed with the patient and her husband.  The patient and her husband agreed.  This was witnessed.  Anesthesia then discussed anesthetic options with the patient and her husband.  They decided for local/digital block plus general.  The patient was then taken back to the operating room.  With the operative team present and the patient awake, a timeout was performed that includes consent as well as side and site marking.  The patient agreed and the whole operative team agreed.  The patient was supine, bony prominences were padded, safety belt was placed, SCDs were on and functioning.  Preinductive antibiotics were administered.  The patient then underwent general anesthesia.  A second timeout was performed.  The whole operative team agreed.  The patient was then prepped with triple prep and draped in the usual sterile fashion.  A third timeout was performed.  The whole operative team agreed.  Centered over the left thumb MCP joint, dorsal longitudinal incision sharply through skin, blunt dissection down onto the extensor pollicis longus tendon.  Thick skin flaps were created.  Dissection down onto the ulnar aspect of the thumb.  A Stener lesion was easily noted, with a rupture of the distal end of the ulnar collateral ligament blocked by the adductor tendon.  The adductor was then taken off the extensor pollicis longus tendon on the ulnar side carefully elevated up.  The ulnar sided neurovascular  Structures were protected.  The MCP joint was easily opened

## 2016-10-17 NOTE — Op Note
and no traumatic arthritic change was present.  The joint was irrigated out.  Next, a drill was placed at the attachment area to the ulnar collateral ligament insertion followed by a Katrinka BlazingSmith & Nephew small joint suture anchor and then the ulnar collateral ligament was primarily repaired in a locking fashion.  The adductor tendon was then sutured in to provide additional support for healing of the repair.  The wound was copiously irrigated out.  Postop timeout was performed.  The whole operative team agreed.  Again, the wound was copiously irrigated out and the skin was closed with 4-0 nylon suture.  Extremity was washed and dried.  Tourniquet had been released.  All 5 digits including the left thumb pinked up with cap refill less than 2 seconds and became warm.  Xeroform, 4 x 4, Webril, and a thumb spica splint with the IP free.  The patient was then awakened, taken to PACU in stable condition.    CONDITION:  Stable.    IMPLANTS:  Smith & Nephew small joint suture anchor.    SPECIMENS:  None.    ESTIMATED BLOOD LOSS:  Less than 3mL.    I was present and scrubbed through the entire case.      Liliane BadeMarc B Kaye, MD    MK/NTS  DD:  10/16/2016 41:66:0610:28:22  DT:  10/16/2016 11:34:44  Job#:  [id number]  Conf#:  [id number]

## 2016-10-17 NOTE — Op Note
to provide additional support for healing of the repair.  The wound was copiously irrigated out.  Postop timeout was performed.  The whole operative team agreed.  Again, the wound was copiously irrigated out and the skin was closed with 4-0 nylon suture.  Extremity was washed and dried.  Tourniquet had been released.  All 5 digits including the left thumb pinked up with cap refill less than 2 seconds and became warm.  Xeroform, 4 x 4, Webril, and a thumb spica splint with the IP free.  The patient was then awakened, taken to PACU in stable condition.    CONDITION:  Stable.    IMPLANTS:  Smith & Nephew small joint suture anchor.    SPECIMENS:  None.    ESTIMATED BLOOD LOSS:  Less than 3mL.    I was present and scrubbed through the entire case.      Liliane BadeMarc B Kaye, MD    MK/NTS  DD:  10/16/2016 16:10:9610:28:22  DT:  10/16/2016 11:34:44  Job#:  [id number]  Conf#:  [id number]

## 2016-10-17 NOTE — Op Note
PATIENT NAME:  Angela Andersen  MRN:   [id number]  DATE OF SERVICE:  10/16/2016    LOCATION:  [facility].    ATTENDING SURGEON:  Marc Kaye, MD    RESIDENT ASSISTANT:  Luke Rasmussen, MD    ANESTHESIOLOGIST:  Igor Ianov, MD    ANESTHESIA TYPE:  Digital block plus local plus general.    PREOPERATIVE DIAGNOSIS:  Left thumb ulnar collateral ligament rupture, with Stener lesion.     PROCEDURE:  Left thumb ulnar collateral ligament repair, primary with adductor advancement (26542).    INDICATION:  The patient sustained a trip and fall onto her left thumb, developed pain and swelling in the area.  She was seen by [facility] and was noted to have a suspected ulnar collateral ligament rupture of her left thumb.  The risks, benefits, alternatives were discussed with the patient regarding treatment options at that time.  .  All questions and concerns were addressed.  The patient acknowledged understanding risks, benefits and alternatives and wanted surgical treatment.  Dr. Brunelli at [facility] then contacted myself, Dr. Kaye, about her hand to discuss the patient.  The patient was then scheduled for surgery.    PROCEDURE:  The patient was identified in Preoperative Holding.  She was examined and I could definitely palpate a Stener lesion that was present.  She was completely unstable at the ulnar collateral ligament both 0 and 30 degrees completely opening up without any sort of end point.  A dorsal volar and the radial collateral ligaments where very stable.  Her IP joint was stable on exam as well where she had some complaints.  I discussed risks, benefits, and alternatives with the patient and husband regarding treatment options.  These are outlined in the consent.  All questions and concerns were addressed.  The patient acknowledged understanding of these risks, benefits and alternatives and wanted surgical treatment.  Consent

## 2016-10-17 NOTE — Immediate Post-Operative Note
[facility]  Immediate Post-Operative Note      Case Record #: [id number]  Angela Andersen  08/13/1966, 50 y.o., female    Case Date: 10/16/2016    Staff:  Surgeon(s) and Role:     * Kaye, Marc B, MD - Primary     * Rasmussen, Luke, MD - Resident - Assisting  Anesthesiologist: Ianov, Igor, MD  CRNA: Patalinghug, Henry F, CRNA      Pre-Op Diagnosis:   Rupture of ulnar collateral ligament of thumb, left, initial encounter [S63.642A]    Procedure(s):  26540 - REPAIR OF COLLATERAL LIGAMENT, METACARPOPHALANGEAL OR INTERPHALANGEAL JOINT (Left)    Post-Op Diagnosis Codes:     * Rupture of ulnar collateral ligament of thumb, left, initial encounter [S63.642A]      Teaching MD Documentation  1. A Resident/ACGME fellow participated in this case: Yes   2. Participating Resident/ACGME fellow was qualified for case type: Yes    3. Teaching surgeon is present entire case (including opening & closing): Yes          Findings/Description: L: thumb MCP ulnar collateral ligament tear s/p L thumb MCP ulnar collateral ligament primary repair with suture anchor    Specimens: None    SCD's applied: yes    EBL: <3 mls    Plan:  Follow up in clinic with Dr. Kaye in 2 weeks. Please call [phone] to schedule or confirm your appointment.  Keep surgical dressing and thumb spica splint clean, dry and intact until your follow up appointment.  Elevate the surgical extremity to reduce swelling.  Complete hand ROM exercises as demonstrated to you in preop, 10 times for 3 sets daily.  Take short course of abx as perscribed. Perscription on chart for keflex.  Take pain medications as perscribed. Do not drive, operate heavy machinery or participate in dangerous activities while taking pain medications. Perscription on chart for Norco 5/325 mg  Call [phone] with questions and concerns.

## 2016-10-31 ENCOUNTER — Ambulatory Visit: Attending: Orthopaedic Surgery | Primary: Family Medicine

## 2016-10-31 DIAGNOSIS — E079 Disorder of thyroid, unspecified: Principal | ICD-10-CM

## 2016-10-31 DIAGNOSIS — S86019A Strain of unspecified Achilles tendon, initial encounter: Secondary | ICD-10-CM

## 2016-10-31 DIAGNOSIS — M797 Fibromyalgia: Secondary | ICD-10-CM

## 2016-10-31 DIAGNOSIS — R102 Pelvic and perineal pain: Secondary | ICD-10-CM

## 2016-10-31 DIAGNOSIS — Z9189 Other specified personal risk factors, not elsewhere classified: Secondary | ICD-10-CM

## 2016-10-31 DIAGNOSIS — G43909 Migraine, unspecified, not intractable, without status migrainosus: Secondary | ICD-10-CM

## 2016-10-31 DIAGNOSIS — N946 Dysmenorrhea, unspecified: Secondary | ICD-10-CM

## 2016-10-31 DIAGNOSIS — Z8639 Personal history of other endocrine, nutritional and metabolic disease: Secondary | ICD-10-CM

## 2016-10-31 DIAGNOSIS — S63642A Sprain of metacarpophalangeal joint of left thumb, initial encounter: Principal | ICD-10-CM

## 2016-10-31 DIAGNOSIS — R3914 Feeling of incomplete bladder emptying: Secondary | ICD-10-CM

## 2016-10-31 DIAGNOSIS — J40 Bronchitis, not specified as acute or chronic: Secondary | ICD-10-CM

## 2016-10-31 DIAGNOSIS — E039 Hypothyroidism, unspecified: Secondary | ICD-10-CM

## 2016-10-31 NOTE — Progress Notes
?   Abdominal pain, unspecified abdominal location   ? Thumb injury, left, sequela   ? Rupture of ulnar collateral ligament of thumb, left, initial encounter       PMH:  Past Medical History:   Diagnosis Date   ? Bronchitis    ? Dysmenorrhea    ? Feeling of incomplete bladder emptying    ? Fibromyalgia    ? H/O hyperkalemia    ? History of general anesthesia complication     VERY sensitive to anesthesia   ? Hypothyroid    ? Migraines     hemiplegic migraine   ? Partial tear of Achilles tendon     left   ? Pelvic pain     before hysterectomy   ? Thyroid disease        PSH:  Past Surgical History:   Procedure Laterality Date   ? COLONOSCOPY     ? COLONOSCOPY W/ OR W/O BIOPSY N/A 01/27/2016    COLONOSCOPY W/ OR W/O BIOPSY performed by Noel GeroldEid, Amalie F, MD [facility]   ? CYSTOSCOPY      Surgery Description: Cystoscopy (Diagnostic);  Problem Comments: 12/16/12 (Created by Conversion)   ? DILATION AND CURETTAGE OF UTERUS      Surgery Description: Dilation And Curettage;  Problem Comments: for heavy bleeding after second delivery (Created by Conversion)   ? EGD W/ BIOPSY N/A 09/01/2016    EGD W/ BIOPSY performed by Lenis NoonMalespin, Miguel, MD [facility]   ? HYSTERECTOMY     ? NASAL SEPTUM SURGERY      Surgery Description: Nasal Septal Deviation Repair;   (Created by Conversion)   ? OTHER SURGICAL HISTORY      Surgery Description: Laparoscopy With Total Hysterectomy;  Problem Comments: excision of endometriosis-12/16/12 (Created by Conversion)   ? SALPINGO-OOPHORECTOMY      Surgery Description: Salpingo-oophorectomy Left Side;  Problem Comments: 12/16/12 (Created by Conversion)   ? TUBAL LIGATION     ? TUBAL LIGATION      Surgery Description: Tubal Ligation;   (Created by Conversion)       Social Hx:  Social History     Social History   ? Marital status: Married     Spouse name: N/A   ? Number of children: N/A   ? Years of education: N/A     Occupational History   ? Not on file.     Social History Main Topics

## 2016-10-31 NOTE — Progress Notes
?   Smoking status: Former Smoker   ? Smokeless tobacco: Never Used   ? Alcohol use 5.0 oz/week     1 Glasses of wine per week      Comment: very rarely once a year   ? Drug use: No   ? Sexual activity: Yes     Partners: Male     Birth control/ protection: Surgical     Other Topics Concern   ? Not on file     Social History Narrative        Family Hx:  Family History   Problem Relation Age of Onset   ? High Blood Pressure Mother    ? High Blood Pressure Father    ? Cancer Maternal Uncle    ? Heart Attack Paternal Uncle    ? Heart Attack Paternal Grandfather    ? Breast Cancer Neg Hx        Vital Signs:     VITAL SIGNS (all recorded)      Clinic Vitals       10/31/16 0935             Amb Encounter Vitals    Weight 103 kg (227 lb)    -SP at 10/31/16 0938       Height 1.676 m (5\' 6" )    -SP at 10/31/16 0938       BMI (Calculated) 36.72    -SP at 10/31/16 0938       BSA (Calculated - sq m) 2.19    -SP at 10/31/16 0938       BP 104/76    -SP at 10/31/16 0938       BP Location Right upper arm    -SP at 10/31/16 [id number]       Position Sitting    -SP at 10/31/16 0938       Pulse Source Apical    -SP at 10/31/16 0938       Pain Score Zero    -SP at 10/31/16 [id number]       Education/Communication Barriers?    Learning/Communication Barriers? No    -SP at 10/31/16 [id number]       Fall Risk Assessment    Had recent fall / Last 6 months? No recent fall    -SP at 10/31/16 [id number]       Does patient have a fear of falling? No    -SP at 10/31/16 [id number]         User Key  (r) = Recorded By, (t) = Taken By, (c) = Cosigned By    [facility] Effective Dates    SP Polke, Shauntrell, MA 12/07/15 -           Physical Exam:  General: A/Ox3; appropriate mood and affect  CV: Pulses palpable, RRR.   Pulm: Even, Unlabored.    MSK Left Upper Extremity:  Wrist/Hand: Skin intact. No atrophy. Mild swelling. Minimal TTP incision area. UCL stable to stress at 0' and 30' to about 5-10', firm endpoint. Sensation present to M/U/R nerves. Motor present

## 2016-10-31 NOTE — Progress Notes
FDS/FDP/EDC/FPL/EPL/IO/opposes thumb. Radial pulse 2+. Fingers are supple, except thumb MCP from the surgery.    Test Conclusion:  None    SUTURE REMOVAL  Date/Time: 10/31/2016 9:30 AM  Performed by: Liliane BadeKAYE, MARC B  Authorized by: Sheliah PlaneKAYE, MARC B   Body area: upper extremity  Location details: left thumb  Wound Appearance: clean  Sutures Removed: 8  Post-removal: Steri-Strips applied  Patient tolerance: Patient tolerated the procedure well with no immediate complications

## 2016-10-31 NOTE — Progress Notes
Current Meds:  Current Outpatient Prescriptions   Medication Sig Dispense Refill   ? cholecalciferol (VITAMIN D-3) 1000 UNITS Tablet Take 1,000 Units by mouth daily.     ? cyanocobalamin (VITAMIN B-12) 50 MCG Tablet Take by mouth daily. Pt does not know dose.     ? levothyroxine (SYNTHROID, LEVOTHROID) 100 MCG PO Tablet Take 1 tablet by mouth daily. 90 tablet 3   ? ondansetron (ZOFRAN) 4 MG PO Tablet Take 1 tablet by mouth daily as needed. 30 tablet 1   ? HYDROcodone-acetaminophen (NORCO) 5-325 MG per tablet Take 1 Tablet by mouth every 6 hours as needed for Pain for 14 days. 60 Tablet 0   ? HYDROcodone-acetaminophen (NORCO) 5-325 MG PO Tablet Take 1-2 tablets by mouth every 4 hours as needed for pain. 60 tablet 0     No current facility-administered medications for this visit.        Active Problems:  Patient Active Problem List   Diagnosis   ? S/P laparoscopic hysterectomy   ? Benign neoplasm of skin, site unspecified   ? Chronic lymphocytic thyroiditis   ? Erythromycin and other macrolides causing adverse effect in therapeutic use   ? Myalgia and myositis, unspecified   ? Unspecified hypertrophic and atrophic condition of skin   ? Nonspecific abnormal results of thyroid function study   ? Dyspareunia   ? Unspecified symptom associated with female genital organs   ? Vitamin D deficiency   ? Excessive or frequent menstruation   ? Premenopausal menorrhagia   ? Dysmenorrhea   ? Leiomyoma of uterus, unspecified   ? Anemia   ? Follow-up examination, following unspecified surgery   ? Screening examination for pulmonary tuberculosis   ? Bacterial vaginosis   ? Painful bladder spasm   ? Urinary tract infection, site not specified   ? Fibromyalgia   ? Need for hepatitis C screening test   ? Hyperkalemia   ? Hypothyroidism   ? Serum potassium elevated   ? Dyspnea on exertion   ? Atypical chest pain   ? Dyslipidemia   ? Class 1 obesity due to excess calories in adult   ? Nausea

## 2016-10-31 NOTE — Progress Notes
Department of Orthopedics   Postop Patient Visit    Assessment:     ICD-10-CM ICD-9-CM   1. Rupture of ulnar collateral ligament of thumb, left, subsequent encounter Z61.096ES63.642A 842.12     Left thumb ulnar collateral ligament rupture, with Stener lesion; DOI: 09/29/16.   ?  DoS: 10/16/16:  Left thumb ulnar collateral ligament repair, primary with adductor advancement    Plan:   - The patient is doing well.  All questions/concerns addressed to the patient and her husband.  - Pain control PRN.  - Continue thumb spica splint.  - Scar care discussed, including scar massage, avoid sun, and wear high SPF when outside x 1 year.    - MVI, Ca++, Vit C supplementation daily.  - Patient able to return to work 11/06/16, light duty, WB< 5lb x 4 weeks.  - The patient will follow up 10 weeks, will not need xrays.  Patient instructed to call or come in if problems, concerns develop that may require earlier surveillance.  Referring Physician/Primary Care Physician:  Thayer HeadingsScuderi, Christopher B, DO    Chief Complaint:  Chief Complaint   Patient presents with   ? Post-op Exam     S/p Lt) thumb collateral ligament repair (10/16/16)       HPI:  Angela Andersen is a 51 y.o. RHD female who presents today with her husband s/p left thumb MCP UCL repair; DoS: 10/16/16. Patient reports some numbness after surgery but has greatly improved, now just at the thumb tip. Pain is 0/10.  Denies fever, chills. She has tested the stability of the thumb out of splint and is very happy how stable it is to stress of the UCL.    The patient does have diabetes. The patient does not smoke.  The patient works at [employer] as a Engineer, civil (consulting)nurse.  Postop (pt reported):  Pain:0/10.  Deformity: improved.  Function: limited, in splint.  Satisfaction: excellent.  Allergies:  Allergies   Allergen Reactions   ? Shellfish Allergy Itching and Swelling   ? Azithromycin Rash     Takes benadryl with this medication.   ? Erythromycin Rash     Takes benadryl with this medication.

## 2016-11-27 ENCOUNTER — Encounter: Attending: Family Medicine | Primary: Family Medicine

## 2016-11-28 ENCOUNTER — Encounter: Attending: Family Medicine | Primary: Family Medicine

## 2016-12-28 ENCOUNTER — Encounter: Attending: Orthopaedic Surgery | Primary: Family Medicine

## 2017-01-22 ENCOUNTER — Encounter: Attending: Cardiovascular Disease | Primary: Family Medicine

## 2017-01-30 ENCOUNTER — Ambulatory Visit (INDEPENDENT_AMBULATORY_CARE_PROVIDER_SITE_OTHER): Payer: 59 | Admitting: Adult Health

## 2017-01-30 ENCOUNTER — Encounter: Payer: Self-pay | Admitting: Adult Health

## 2017-01-30 VITALS — BP 115/81 | HR 75 | Ht 66.0 in | Wt 224.5 lb

## 2017-01-30 DIAGNOSIS — M797 Fibromyalgia: Secondary | ICD-10-CM | POA: Diagnosis not present

## 2017-01-30 DIAGNOSIS — M79675 Pain in left toe(s): Secondary | ICD-10-CM

## 2017-01-30 DIAGNOSIS — M7661 Achilles tendinitis, right leg: Secondary | ICD-10-CM | POA: Diagnosis not present

## 2017-01-30 DIAGNOSIS — M79674 Pain in right toe(s): Secondary | ICD-10-CM

## 2017-01-30 DIAGNOSIS — M7662 Achilles tendinitis, left leg: Secondary | ICD-10-CM

## 2017-01-30 DIAGNOSIS — E039 Hypothyroidism, unspecified: Secondary | ICD-10-CM

## 2017-01-30 DIAGNOSIS — Z0000 Encounter for general adult medical examination without abnormal findings: Secondary | ICD-10-CM

## 2017-01-30 NOTE — Assessment & Plan Note (Addendum)
Podiatry referral placed. Recommend TED hose.

## 2017-01-30 NOTE — Assessment & Plan Note (Signed)
Will call when uric level results are available. Try to consume at least 100 ounces daily. Follow heart healthy diet. Recommend regular movement -Beginner yoga, stationary bike, swimming. Podiatry referral placed. Awaiting records from [place] PCP. Follow-up every 6 months, sooner if needed.

## 2017-01-30 NOTE — Progress Notes (Signed)
Subjective:    Patient ID: Carol Jenkins, female    DOB: [birth year], 51 y.o.   MRN: [id number]  HPI:  [name] presents to establish as a new pt.  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  PMH:  Hx of Hypothyroidism, Fibromyalgia (dx by Endocrinologist 2012), L Achille tendonitis (7/10 intermittent pain), bil great toe pain (3/10 that is aching in nature, father has hx of gout). 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  She has experienced increased fatigue/fibromyalgia pain since starting her new position.  She had CPE with full labs Jan 2018 with her previous PCP in [place]-records have been requested.  She reports drinking 80-90 ounces water daily and feels that she eats "pretty well during the day, it's when I gets home from work when I start snacking".   Of Note:  They lost their 3 yr old son in 2010 from [place].  She has been in/out of CBT-declined referral today.  Patient Care Team    Relationship Specialty Notifications Start End  Esaw Grandchild, NP PCP - General Family Medicine  01/30/17     Patient Active Problem List   Diagnosis Date Noted  . Health care maintenance 01/30/2017  . Toe pain, bilateral 01/30/2017  . Tendonitis, Achilles, left 01/30/2017  . Hypothyroid 01/30/2017  . Fibromyalgia 01/30/2017     Past Medical History:  Diagnosis Date  . Headache syndrome    hemiparegic  . Hyperlipidemia   . Neuromuscular disorder (HCC)    Fibromyalgia  . Thyroid disease      Past Surgical History:  Procedure Laterality Date  . ABDOMINAL HYSTERECTOMY     patrial  . CRUCIATE LIGAMENT REPAIR    . NASAL SEPTUM SURGERY    . TUBAL LIGATION       Family History  Problem Relation Age of Onset  . Alcohol abuse Mother   . Hyperlipidemia Mother   . Hypertension Mother   . Alcohol abuse Father   . Hyperlipidemia Father   . Hypertension Father   . Alcohol abuse Brother   . Diabetes Son   . Cancer Maternal Uncle         lung  . Heart attack Paternal Uncle   . Hypothyroidism Paternal Uncle   . Alcohol abuse Maternal Grandmother   . Hyperlipidemia Maternal Grandmother   . Hypertension Maternal Grandmother   . Alcohol abuse Maternal Grandfather   . Cancer Paternal Grandmother        abdominal  . Heart attack Paternal Grandfather   . Alcohol abuse Brother   . Cancer Maternal Uncle        stomach  . Heart attack Paternal Uncle   . Hypothyroidism Paternal Uncle   . Heart attack Paternal Uncle   . Hypothyroidism Paternal Uncle      History  Drug Use No     History  Alcohol Use No     History  Smoking Status  . Former Smoker  . Packs/day: 1.00  . Years: 15.00  . Quit date: 09/04/1998  Smokeless Tobacco  . Never Used     Outpatient Encounter Prescriptions as of 01/30/2017  Medication Sig  . Cholecalciferol 1000 units TBDP Take 1-2 tablets by mouth daily.  Marland Kitchen levothyroxine (SYNTHROID, LEVOTHROID) 100 MCG tablet Take 100 mcg by mouth daily before breakfast.   No facility-administered encounter medications on file as of 01/30/2017.     Allergies: Shellfish allergy  Body  mass index is 36.24 kg/m.  Blood pressure 115/81, pulse 75, height 5\' 6"  (1.676 m), weight 224 lb 8 oz (101.8 kg).      Review of Systems  Constitutional: Positive for fatigue. Negative for activity change, appetite change, chills, diaphoresis, fever and unexpected weight change.  HENT: Negative for congestion.   Eyes: Negative for visual disturbance.  Respiratory: Negative for cough, chest tightness, shortness of breath, wheezing and stridor.   Cardiovascular: Negative for chest pain, palpitations and leg swelling.  Gastrointestinal: Negative for abdominal distention, abdominal pain, constipation, diarrhea, nausea and vomiting.  Endocrine: Negative for cold intolerance, heat intolerance, polydipsia, polyphagia and polyuria.  Genitourinary: Negative for difficulty urinating and flank pain.  Musculoskeletal:  Positive for arthralgias, joint swelling and myalgias. Negative for back pain, gait problem, neck pain and neck stiffness.  Skin: Negative for color change, pallor, rash and wound.  Allergic/Immunologic: Negative for immunocompromised state.  Neurological: Positive for headaches. Negative for dizziness, tremors and weakness.  Hematological: Does not bruise/bleed easily.  Psychiatric/Behavioral: Negative for agitation, decreased concentration, self-injury, sleep disturbance and suicidal ideas. The patient is not nervous/anxious and is not hyperactive.        Objective:   Physical Exam  Constitutional: She is oriented to person, place, and time. She appears well-developed and well-nourished. No distress.  HENT:  Head: Normocephalic and atraumatic.  Right Ear: Hearing, tympanic membrane, external ear and ear canal normal. No decreased hearing is noted.  Left Ear: Hearing, tympanic membrane, external ear and ear canal normal. No decreased hearing is noted.  Nose: Right sinus exhibits no maxillary sinus tenderness and no frontal sinus tenderness. Left sinus exhibits no maxillary sinus tenderness and no frontal sinus tenderness.  Mouth/Throat: Uvula is midline.  Eyes: Conjunctivae are normal. Pupils are equal, round, and reactive to light.  Neck: Normal range of motion. Neck supple.  Cardiovascular: Normal rate, regular rhythm, normal heart sounds and intact distal pulses.   No murmur heard. Pulmonary/Chest: Effort normal and breath sounds normal. No respiratory distress. She has no wheezes. She has no rales. She exhibits no tenderness.  Abdominal: Soft. Bowel sounds are normal. She exhibits no distension and no mass. There is no tenderness. There is no rebound and no guarding.  Musculoskeletal: Normal range of motion. She exhibits edema and tenderness.       Right knee: She exhibits normal range of motion and no swelling.       Left knee: She exhibits normal range of motion and no swelling.        Right ankle: She exhibits swelling. She exhibits normal range of motion. Achilles tendon exhibits no pain and no defect.       Left ankle: She exhibits swelling. She exhibits normal range of motion. Achilles tendon exhibits pain. Achilles tendon exhibits no defect.  TTP bil lower extremities, no acute areas of tenderness, just diffuse.  Lymphadenopathy:    She has no cervical adenopathy.  Neurological: She is alert and oriented to person, place, and time. Coordination normal.  Skin: Skin is warm and dry. No rash noted. She is not diaphoretic. No erythema. No pallor.  Psychiatric: She has a normal mood and affect. Her behavior is normal. Judgment and thought content normal.  Nursing note and vitals reviewed.         Assessment & Plan:   1. Toe pain, bilateral   2. Achilles tendinitis of both lower extremities   3. Health care maintenance   4. Tendonitis, Achilles, left   5. Hypothyroidism, unspecified  type   6. Fibromyalgia     Health care maintenance Will call when uric level results are available. Try to consume at least 100 ounces daily. Follow heart healthy diet. Recommend regular movement -Beginner yoga, stationary bike, swimming. Podiatry referral placed. Awaiting records from [place] PCP. Follow-up every 6 months, sooner if needed.  Toe pain, bilateral Uric acid level checked today. Podiatry referral placed. Recommend TED hose.  Tendonitis, Achilles, left Podiatry referral placed. Recommend TED hose.  Hypothyroid Continue on levothyroxine 160mcg daily.  Fibromyalgia Continue hot bathes. Recommend beginner yoga, stationary bike, swimming-most days of week to help with pain and stiffness.    FOLLOW-UP:  Return in about 6 months (around 08/02/2017) for Regular Follow Up.

## 2017-01-30 NOTE — Patient Instructions (Addendum)
Heart-Healthy Eating Plan Many factors influence your heart health, including eating and exercise habits. Heart (coronary) risk increases with abnormal blood fat (lipid) levels. Heart-healthy meal planning includes limiting unhealthy fats, increasing healthy fats, and making other small dietary changes. This includes maintaining a healthy body weight to help keep lipid levels within a normal range. What is my plan? Your health care provider recommends that you:  Get no more than _________% of the total calories in your daily diet from fat.  Limit your intake of saturated fat to less than _________% of your total calories each day.  Limit the amount of cholesterol in your diet to less than _________ mg per day. What types of fat should I choose?  Choose healthy fats more often. Choose monounsaturated and polyunsaturated fats, such as olive oil and canola oil, flaxseeds, walnuts, almonds, and seeds.  Eat more omega-3 fats. Good choices include salmon, mackerel, sardines, tuna, flaxseed oil, and ground flaxseeds. Aim to eat fish at least two times each week.  Limit saturated fats. Saturated fats are primarily found in animal products, such as meats, butter, and cream. Plant sources of saturated fats include palm oil, palm kernel oil, and coconut oil.  Avoid foods with partially hydrogenated oils in them. These contain trans fats. Examples of foods that contain trans fats are stick margarine, some tub margarines, cookies, crackers, and other baked goods. What general guidelines do I need to follow?  Check food labels carefully to identify foods with trans fats or high amounts of saturated fat.  Fill one half of your plate with vegetables and green salads. Eat 4-5 servings of vegetables per day. A serving of vegetables equals 1 cup of raw leafy vegetables,  cup of raw or cooked cut-up vegetables, or  cup of vegetable juice.  Fill one fourth of your plate with whole grains. Look for the word  "whole" as the first word in the ingredient list.  Fill one fourth of your plate with lean protein foods.  Eat 4-5 servings of fruit per day. A serving of fruit equals one medium whole fruit,  cup of dried fruit,  cup of fresh, frozen, or canned fruit, or  cup of 100% fruit juice.  Eat more foods that contain soluble fiber. Examples of foods that contain this type of fiber are apples, broccoli, carrots, beans, peas, and barley. Aim to get 20-30 g of fiber per day.  Eat more home-cooked food and less restaurant, buffet, and fast food.  Limit or avoid alcohol.  Limit foods that are high in starch and sugar.  Avoid fried foods.  Cook foods by using methods other than frying. Baking, boiling, grilling, and broiling are all great options. Other fat-reducing suggestions include:  Removing the skin from poultry.  Removing all visible fats from meats.  Skimming the fat off of stews, soups, and gravies before serving them.  Steaming vegetables in water or broth.  Lose weight if you are overweight. Losing just 5-10% of your initial body weight can help your overall health and prevent diseases such as diabetes and heart disease.  Increase your consumption of nuts, legumes, and seeds to 4-5 servings per week. One serving of dried beans or legumes equals  cup after being cooked, one serving of nuts equals 1 ounces, and one serving of seeds equals  ounce or 1 tablespoon.  You may need to monitor your salt (sodium) intake, especially if you have high blood pressure. Talk with your health care provider or dietitian to get more  information about reducing sodium. What foods can I eat? Grains   Breads, including [place], white, pita, wheat, raisin, rye, oatmeal, and [place]. Tortillas that are neither fried nor made with lard or trans fat. Low-fat rolls, including hotdog and hamburger buns and English muffins. Biscuits. Muffins. Waffles. Pancakes. Light popcorn. Whole-grain cereals. Flatbread.  Melba toast. Pretzels. Breadsticks. Rusks. Low-fat snacks and crackers, including oyster, saltine, matzo, graham, animal, and rye. Rice and pasta, including [place] rice and those that are made with whole wheat. Vegetables  All vegetables. Fruits  All fruits, but limit coconut. Meats and Other Protein Sources  Lean, well-trimmed beef, veal, pork, and lamb. Chicken and [place] without skin. All fish and shellfish. Wild duck, rabbit, pheasant, and venison. Egg whites or low-cholesterol egg substitutes. Dried beans, peas, lentils, and tofu.Seeds and most nuts. Dairy  Low-fat or nonfat cheeses, including ricotta, string, and mozzarella. Skim or 1% milk that is liquid, powdered, or evaporated. Buttermilk that is made with low-fat milk. Nonfat or low-fat yogurt. Beverages  Mineral water. Diet carbonated beverages. Sweets and Desserts  Sherbets and fruit ices. Honey, jam, marmalade, jelly, and syrups. Meringues and gelatins. Pure sugar candy, such as hard candy, jelly beans, gumdrops, mints, marshmallows, and small amounts of dark chocolate. [employer] Eat all sweets and desserts in moderation. Fats and Oils  Nonhydrogenated (trans-free) margarines. Vegetable oils, including soybean, sesame, sunflower, olive, peanut, safflower, corn, canola, and cottonseed. Salad dressings or mayonnaise that are made with a vegetable oil. Limit added fats and oils that you use for cooking, baking, salads, and as spreads. Other  Cocoa powder. Coffee and tea. All seasonings and condiments. The items listed above may not be a complete list of recommended foods or beverages. Contact your dietitian for more options.  What foods are not recommended? Grains  Breads that are made with saturated or trans fats, oils, or whole milk. Croissants. Butter rolls. Cheese breads. Sweet rolls. Donuts. Buttered popcorn. Chow mein noodles. High-fat crackers, such as cheese or butter crackers. Meats and Other Protein Sources  Fatty  meats, such as hotdogs, short ribs, sausage, spareribs, bacon, ribeye roast or steak, and mutton. High-fat deli meats, such as salami and bologna. Caviar. Domestic duck and goose. Organ meats, such as kidney, liver, sweetbreads, brains, gizzard, chitterlings, and heart. Dairy  Cream, sour cream, cream cheese, and creamed cottage cheese. Whole milk cheeses, including blue (bleu), Monterey Jack, [place], [place], American, [place], Swiss, [place], [place], and [place]. Whole or 2% milk that is liquid, evaporated, or condensed. Whole buttermilk. Cream sauce or high-fat cheese sauce. Yogurt that is made from whole milk. Beverages  Regular sodas and drinks with added sugar. Sweets and Desserts  Frosting. Pudding. Cookies. Cakes other than angel food cake. Candy that has milk chocolate or white chocolate, hydrogenated fat, butter, coconut, or unknown ingredients. Buttered syrups. Full-fat ice cream or ice cream drinks. Fats and Oils  Gravy that has suet, meat fat, or shortening. Cocoa butter, hydrogenated oils, palm oil, coconut oil, palm kernel oil. These can often be found in baked products, candy, fried foods, nondairy creamers, and whipped toppings. Solid fats and shortenings, including bacon fat, salt pork, lard, and butter. Nondairy cream substitutes, such as coffee creamers and sour cream substitutes. Salad dressings that are made of unknown oils, cheese, or sour cream. The items listed above may not be a complete list of foods and beverages to avoid. Contact your dietitian for more information.  This information is not intended to replace advice given to you by  your health care provider. Make sure you discuss any questions you have with your health care provider. Document Released: 05/30/2008 Document Revised: 03/10/2016 Document Reviewed: 02/12/2014 Elsevier Interactive Patient Education  2017 [place].   Achilles Tendinitis Achilles tendinitis is inflammation of the tough, cord-like band that  attaches the lower leg muscles to the heel bone (Achilles tendon). This is usually caused by overusing the tendon and the ankle joint. Achilles tendinitis usually gets better over time with treatment and caring for yourself at home. It can take weeks or months to heal completely. What are the causes? This condition may be caused by:  A sudden increase in exercise or activity, such as running.  Doing the same exercises or activities (such as jumping) over and over.  Not warming up calf muscles before exercising.  Exercising in shoes that are worn out or not made for exercise.  Having arthritis or a bone growth (spur) on the back of the heel bone. This can rub against the tendon and hurt it.  Age-related wear and tear. Tendons become less flexible with age and more likely to be injured. What are the signs or symptoms? Common symptoms of this condition include:  Pain in the Achilles tendon or in the back of the leg, just above the heel. The pain usually gets worse with exercise.  Stiffness or soreness in the back of the leg, especially in the morning.  Swelling of the skin over the Achilles tendon.  Thickening of the tendon.  Bone spurs at the bottom of the Achilles tendon, near the heel.  Trouble standing on tiptoe. How is this diagnosed? This condition is diagnosed based on your symptoms and a physical exam. You may have tests, including:  X-rays.  MRI. How is this treated? The goal of treatment is to relieve symptoms and help your injury heal. Treatment may include:  Decreasing or stopping activities that caused the tendinitis. This may mean switching to low-impact exercises like biking or swimming.  Icing the injured area.  Doing physical therapy, including strengthening and stretching exercises.  NSAIDs to help relieve pain and swelling.  Using supportive shoes, wraps, heel lifts, or a walking boot (air cast).  Surgery. This may be done if your symptoms do not  improve after 6 months.  Using high-energy shock wave impulses to stimulate the healing process (extracorporeal shock wave therapy). This is rare.  Injection of medicines to help relieve inflammation (corticosteroids). This is rare. Follow these instructions at home: If you have an air cast:   Wear the cast as told by your health care provider. Remove it only as told by your health care provider.  Loosen the cast if your toes tingle, become numb, or turn cold and blue. Activity   Gradually return to your normal activities once your health care provider approves. Do not do activities that cause pain.  Consider doing low-impact exercises, like cycling or swimming.  If you have an air cast, ask your health care provider when it is safe for you to drive.  If physical therapy was prescribed, do exercises as told by your health care provider or physical therapist. Managing pain, stiffness, and swelling   Raise (elevate) your foot above the level of your heart while you are sitting or lying down.  Move your toes often to avoid stiffness and to lessen swelling.  If directed, put ice on the injured area:  Put ice in a plastic bag.  Place a towel between your skin and the bag.  Leave the ice on for 20 minutes, 2-3 times a day General instructions   If directed, wrap your foot with an elastic bandage or other wrap. This can help keep your tendon from moving too much while it heals. Your health care provider will show you how to wrap your foot correctly.  Wear supportive shoes or heel lifts only as told by your health care provider.  Take over-the-counter and prescription medicines only as told by your health care provider.  Keep all follow-up visits as told by your health care provider. This is important. Contact a health care provider if:  You have symptoms that gets worse.  You have pain that does not get better with medicine.  You develop new, unexplained symptoms.  You  develop warmth and swelling in your foot.  You have a fever. Get help right away if:  You have a sudden popping sound or sensation in your Achilles tendon followed by severe pain.  You cannot move your toes or foot.  You cannot put any weight on your foot. Summary  Achilles tendinitis is inflammation of the tough, cord-like band that attaches the lower leg muscles to the heel bone (Achilles tendon).  This condition is usually caused by overusing the tendon and the ankle joint. It can also be caused by arthritis or normal aging.  The most common symptoms of this condition include pain, swelling, or stiffness in the Achilles tendon or in the back of the leg.  This condition is usually treated with rest, NSAIDs, and physical therapy. This information is not intended to replace advice given to you by your health care provider. Make sure you discuss any questions you have with your health care provider. Document Released: 05/31/2005 Document Revised: 07/10/2016 Document Reviewed: 07/10/2016 Elsevier Interactive Patient Education  2017 [employer].  Will call when uric level results are available. Try to consume at least 100 ounces daily. Follow heart healthy diet. Recommend regular movement -Beginner yoga, stationary bike, swimming. Podiatry referral placed. Awaiting records from [place] PCP. Follow-up every 6 months, sooner if needed.

## 2017-01-30 NOTE — Assessment & Plan Note (Signed)
Continue hot bathes. Recommend beginner yoga, stationary bike, swimming-most days of week to help with pain and stiffness.

## 2017-01-30 NOTE — Assessment & Plan Note (Signed)
Continue on levothyroxine 18mcg daily.

## 2017-01-30 NOTE — Assessment & Plan Note (Addendum)
Uric acid level checked today. Podiatry referral placed. Recommend TED hose.

## 2017-01-31 LAB — URIC ACID: Uric Acid: 5.2 mg/dL (ref 2.5–7.1)

## 2017-02-06 ENCOUNTER — Ambulatory Visit: Payer: 59

## 2017-02-07 ENCOUNTER — Ambulatory Visit: Payer: 59

## 2017-02-08 ENCOUNTER — Ambulatory Visit: Payer: 59

## 2017-02-09 ENCOUNTER — Ambulatory Visit: Payer: 59

## 2017-02-20 ENCOUNTER — Other Ambulatory Visit: Payer: Self-pay | Admitting: Adult Health

## 2017-02-20 ENCOUNTER — Telehealth: Payer: Self-pay | Admitting: Adult Health

## 2017-02-20 DIAGNOSIS — E039 Hypothyroidism, unspecified: Secondary | ICD-10-CM

## 2017-02-20 MED ORDER — LEVOTHYROXINE SODIUM 100 MCG PO TABS: 100.0000 ug | ORAL_TABLET | Freq: Every day | ORAL | 2 refills | Status: DC

## 2017-02-20 NOTE — Telephone Encounter (Signed)
Patient needs Synthroid 186mcg refilled to [facility].  Please advise.

## 2017-02-20 NOTE — Telephone Encounter (Signed)
Evening,  Please call Ms. Birman and share that her RF was sent in. Thanks! Valetta Fuller

## 2017-02-20 NOTE — Telephone Encounter (Signed)
Husband came in saying wife will be losing coverage during a job transition and wants a refill on all meds to make sure they are covered until new insurance kicks in.

## 2017-02-21 MED FILL — LEVOTHYROXINE 100 MCG TABLE: 100 | 90 days supply | Qty: 90 | Fill #0 | Status: TO

## 2017-02-21 NOTE — Telephone Encounter (Signed)
Called patient and notified. MPulliam, CMA

## 2017-02-22 ENCOUNTER — Ambulatory Visit (INDEPENDENT_AMBULATORY_CARE_PROVIDER_SITE_OTHER): Payer: 59

## 2017-02-22 ENCOUNTER — Encounter: Payer: Self-pay | Admitting: Podiatry

## 2017-02-22 ENCOUNTER — Ambulatory Visit (INDEPENDENT_AMBULATORY_CARE_PROVIDER_SITE_OTHER): Payer: 59 | Admitting: Podiatry

## 2017-02-22 ENCOUNTER — Ambulatory Visit: Payer: 59

## 2017-02-22 VITALS — BP 104/69 | HR 89 | Resp 16 | Ht 66.0 in | Wt 225.0 lb

## 2017-02-22 DIAGNOSIS — M79671 Pain in right foot: Secondary | ICD-10-CM

## 2017-02-22 DIAGNOSIS — M79672 Pain in left foot: Secondary | ICD-10-CM

## 2017-02-22 DIAGNOSIS — M21619 Bunion of unspecified foot: Secondary | ICD-10-CM

## 2017-02-22 DIAGNOSIS — M7662 Achilles tendinitis, left leg: Secondary | ICD-10-CM | POA: Diagnosis not present

## 2017-02-22 DIAGNOSIS — M25572 Pain in left ankle and joints of left foot: Secondary | ICD-10-CM

## 2017-02-22 MED ORDER — TRIAMCINOLONE ACETONIDE 10 MG/ML IJ SUSP
10.0000 mg | Freq: Once | INTRAMUSCULAR | Status: AC
  Administered 2017-02-22: 10 mg

## 2017-02-22 NOTE — Patient Instructions (Signed)

## 2017-02-22 NOTE — Progress Notes (Signed)
   Subjective:    Patient ID: Carol Jenkins, female    DOB: Jan 21, 1966, 51 y.o.   MRN: [id number]  HPI Chief Complaint  Patient presents with  . Foot Pain    Left foot; heel-lateral side; x1.5 yrs  . Toe Pain    Bilateral; great toes-joint; pt stated, "Toes hurt sometimes"; x1 yr      Review of Systems  Musculoskeletal: Positive for arthralgias and gait problem.  Allergic/Immunologic: Positive for food allergies.  All other systems reviewed and are negative.      Objective:   Physical Exam        Assessment & Plan:

## 2017-02-22 NOTE — Progress Notes (Signed)
Subjective:    Patient ID: Carol Jenkins, female   DOB: 51 y.o.   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       Objective:  Physical Exam  Cardiovascular: Intact distal pulses.   Musculoskeletal: Normal range of motion.  Neurological: She is alert.  Skin: Skin is warm.  Nursing note and vitals reviewed.  neurovascular status intact muscle strength adequate range of motion within normal limits with patient found to have quite a bit of discomfort in the posterior lateral aspect of the left heel at the insertion of the Achilles tendon with moderate structural bunion deformity bilateral and bone irritation around the fifth metatarsal left and fifth digit where she has a abnormal growth pattern. 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 This was done lateral with 3 mg dexamethasone Kenalog combination 5 mill grams Xylocaine I dispensed a silicone sheet padding to cushion the back of the heel advised on wearing her boot for the next week or 2 and shoes with higher heel. Patient be seen back to recheck again in the next several weeks and at this time we will defer bunion correction  X-rays indicate structural bunion abnormal fourth metatarsal bony protrusion and small spur posterior heel

## 2017-03-14 ENCOUNTER — Telehealth: Payer: Self-pay | Admitting: Podiatry

## 2017-03-14 NOTE — Telephone Encounter (Signed)
Per pt she cxled her appt with you said she was doing great and you did a great job.

## 2017-03-15 ENCOUNTER — Ambulatory Visit: Payer: 59 | Admitting: Podiatry

## 2017-05-28 ENCOUNTER — Ambulatory Visit (INDEPENDENT_AMBULATORY_CARE_PROVIDER_SITE_OTHER): Payer: BLUE CROSS/BLUE SHIELD | Admitting: Family Medicine

## 2017-05-28 ENCOUNTER — Encounter: Payer: Self-pay | Admitting: Family Medicine

## 2017-05-28 ENCOUNTER — Encounter: Payer: Self-pay | Admitting: Gastroenterology

## 2017-05-28 VITALS — BP 115/82 | HR 80 | Ht 66.0 in | Wt 225.4 lb

## 2017-05-28 DIAGNOSIS — E039 Hypothyroidism, unspecified: Secondary | ICD-10-CM | POA: Diagnosis not present

## 2017-05-28 DIAGNOSIS — R5383 Other fatigue: Secondary | ICD-10-CM | POA: Insufficient documentation

## 2017-05-28 DIAGNOSIS — R110 Nausea: Secondary | ICD-10-CM | POA: Diagnosis not present

## 2017-05-28 DIAGNOSIS — R143 Flatulence: Secondary | ICD-10-CM

## 2017-05-28 DIAGNOSIS — Z9289 Personal history of other medical treatment: Secondary | ICD-10-CM | POA: Insufficient documentation

## 2017-05-28 DIAGNOSIS — Z9889 Other specified postprocedural states: Secondary | ICD-10-CM | POA: Diagnosis not present

## 2017-05-28 DIAGNOSIS — R141 Gas pain: Secondary | ICD-10-CM

## 2017-05-28 DIAGNOSIS — Z90710 Acquired absence of both cervix and uterus: Secondary | ICD-10-CM | POA: Insufficient documentation

## 2017-05-28 DIAGNOSIS — R142 Eructation: Secondary | ICD-10-CM

## 2017-05-28 DIAGNOSIS — R1013 Epigastric pain: Secondary | ICD-10-CM

## 2017-05-28 DIAGNOSIS — Z90721 Acquired absence of ovaries, unilateral: Secondary | ICD-10-CM

## 2017-05-28 NOTE — Progress Notes (Signed)
Pt here for an acute care OV today   Impression and Recommendations:    1. Epigastric pain- chronic pain   2. Fatigue, unspecified type   3. Nausea   4. Flatulence, eructation and gas pain   5. Hypothyroidism (acquired)   6. Status post colonoscopy with polypectomy     Epigastric pain- chronic pain  - Plan: Ambulatory referral to Gastroenterology Today. - Recommend patient follow FODmap diet and keep a food journal to see what foods tend to make her symptoms worse. - Since PPIs and H2 blockers have not improved symptoms of past, patient will hold off for now and be treated with whatever medicine as recommended by gastroenterology  Fatigue, unspecified type  - Per patient she feels that the brand name works much better than generic levothyroxine, and feels this is why she is fatigued now. - Not sure insurance will pay for brand the patient came request it  Nausea  - Plan: Ambulatory referral to Gastroenterology - Labwork recent past obtained when patient was having these symptoms and essentially within normal limits.  Flatulence, eructation and gas pain  - Plan: Ambulatory referral to Gastroenterology - trial beano  Status post colonoscopy with polypectomy  Hypothyroidism (acquired) Patient states that Synthroid brand name works better than her generic levothyroxine.  She is changing pharmacies from [facility] outpatient [facility] [address].  She asked for [place] to call her in new prescription to this new pharmacy.  However,  [name] will call patient and let her know she can just transfer the same prescription and ask/ request brand name not generic.  No need for new prescription.   The patient was counseled, risk factors were discussed, anticipatory guidance given.   Orders Placed This Encounter  Procedures  . Ambulatory referral to Gastroenterology    Please see AVS handed out to patient at the end of our visit for further patient instructions/ counseling  done pertaining to today's office visit.   Return if symptoms worsen or fail to improve, for ALSO-  f/up with your pcp per their recs when last seen.     Note: This document was prepared using Dragon voice recognition software and may include unintentional dictation errors.  [name] 10:24 AM --------------------------------------------------------------------------------------------------------------------------------------------------------------------------------------------------------------------------------------------    Subjective:    CC:  Chief Complaint  Patient presents with  . Abdominal Pain    mid upper abd pain x 1 year     HPI: Carol Jenkins is a 51 y.o. female who presents to [place] at [facility] today for issues as discussed below.  Here for acute OV to discuss her chronic mild abd discomfort/ epigastric discomfort and other associated sx.   these symptoms going on for over a year now.  Pt was seen in extensive workup in [place] for this, including being seen by the cardiologistto r/o cardiac etiology with a negative gated exercise stress myocardial perfusion imaging study.   ( Patient states she has an extensive cardiac history in her family as well as extensive cancer history in her family)     Had w/up by a [facility] gastroenterologist, Dr Raynelle Chary, as well.   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  Her colonoscopy was negative except for a benign polyp.  Repeat 5 years- around 2022.    She has been well controlled until more recently in which she's had more gas, and occasionally gets nauseated with certain  foods she eats especially fried/ greasy foods.  She has not vomited.    She has not vomited.  Her pain has waxed and wane in intensity and has had pain-free days.  Pain currently is 1-2 out of 10.  More gasey lately - burping and flatulence.   This  seems to be worse with greasy foods and can cause some abdominal discomfort in the upper gastric region.  Also has concerns about her stool -  it has changed some to whatever she is eating, her stool will be that color of whatever she ate.  She denies any blood or black tarry stools.  BMs tend to be more hard and has some constipation more so than diarrhea.  In past treated with sucralfate and nexium- no help at all, no change in sx.   she is currently not on any medicines for this.  - Also patient feels that her levothyroxine, when given to her in generic from is not as effective as plain Synthroid.  She is switching pharmacies from [facility].     Problem  S/P hysterectomy with oophorectomy 2014- endometriosis  Status Post Colonoscopy With Polypectomy   Colonoscopy in 2017.  One benign polyp.  Due to family history repeat 5 years around 2022.   H/O mammogram- 7/17- N  Epigastric pain- chronic pain  Fatigue  Hypothyroidism (Acquired)     Wt Readings from Last 3 Encounters:  05/28/17 225 lb 6.4 oz (102.2 kg)  02/22/17 225 lb (102.1 kg)  01/30/17 224 lb 8 oz (101.8 kg)   BP Readings from Last 3 Encounters:  05/28/17 115/82  02/22/17 104/69  01/30/17 115/81   BMI Readings from Last 3 Encounters:  05/28/17 36.38 kg/m  02/22/17 36.32 kg/m  01/30/17 36.24 kg/m     Patient Care Team    Relationship Specialty Notifications Start End  Esaw Grandchild, NP PCP - General Family Medicine  01/30/17      Patient Active Problem List   Diagnosis Date Noted  . S/P hysterectomy with oophorectomy 2014- endometriosis 05/28/2017  . Status post colonoscopy with polypectomy 05/28/2017  . H/O mammogram- 7/17- N 05/28/2017  . Epigastric pain- chronic pain 05/28/2017  . Fatigue 05/28/2017  . Health care maintenance 01/30/2017  . Toe pain, bilateral 01/30/2017  . Tendonitis, Achilles, left 01/30/2017  . Hypothyroidism (acquired) 01/30/2017  . Fibromyalgia 01/30/2017      Past Medical history, Surgical history, Family history, Social history, Allergies and Medications have been entered into the medical record, reviewed and changed as needed.    Current Meds  Medication Sig  . Cholecalciferol (VITAMIN D3) 2000 units capsule Take 2,000 Units by mouth daily.  Marland Kitchen levothyroxine (SYNTHROID, LEVOTHROID) 100 MCG tablet Take 1 tablet (100 mcg total) by mouth daily before breakfast.    Allergies:  Allergies  Allergen Reactions  . Shellfish Allergy Shortness Of Breath    Facial swelling     Review of Systems: General:   Denies fever, chills, unexplained weight loss.  Optho/Auditory:   Denies visual changes, blurred vision/LOV Respiratory:   Denies wheeze, DOE more than baseline levels.  Cardiovascular:   Denies chest pain, palpitations, new onset peripheral edema  Gastrointestinal:   Denies vomiting, diarrhea.  Genitourinary: Denies dysuria, freq/ urgency, flank pain or discharge from genitals.  Endocrine:     Denies hot or cold intolerance, polyuria, polydipsia. Musculoskeletal:   Denies unexplained myalgias, joint swelling, unexplained arthralgias, gait problems.  Skin:  Denies new onset rash, suspicious lesions  Neurological:     Denies dizziness, unexplained weakness, numbness  Psychiatric/Behavioral:   Denies mood changes, suicidal or homicidal ideations, hallucinations    Objective:   Blood pressure 115/82, pulse 80, height 5\' 6"  (1.676 m), weight 225 lb 6.4 oz (102.2 kg). Body mass index is 36.38 kg/m. General:  Well Developed, well nourished, appropriate for stated age.  Neuro:  Alert and oriented,  extra-ocular muscles intact  HEENT:  Normocephalic, atraumatic, neck supple Skin:  no gross rash, warm, pink. Cardiac:  RRR, S1 S2 ABD:  No guarding rigidity rebound, bowel sounds-hypoactive 4, mildly tender to deep palpation in epigastric region only.  No organomegaly appreciated. Respiratory:  ECTA B/L and A/P, Not using accessory muscles,  speaking in full sentences- unlabored. Vascular:  Ext warm, no cyanosis apprec.; cap RF less 2 sec. Psych:  No HI/SI, judgement and insight good, Euthymic mood. Full Affect.

## 2017-05-28 NOTE — Assessment & Plan Note (Signed)
Patient states that Synthroid brand name works better than her generic levothyroxine.  She is changing pharmacies [facility] [address]  She asked for [place] to call her in new prescription to this new pharmacy.  However,  [name] will call patient and let her know she can just transfer the same prescription and ask/ request brand name not generic.  No need for new prescription.

## 2017-05-28 NOTE — Patient Instructions (Signed)
FODMAP diet    Irritable Bowel Syndrome, Adult Irritable bowel syndrome (IBS) is not one specific disease. It is a group of symptoms that affects the organs responsible for digestion (gastrointestinal or GI tract). To regulate how your GI tract works, your body sends signals back and forth between your intestines and your brain. If you have IBS, there may be a problem with these signals. As a result, your GI tract does not function normally. Your intestines may become more sensitive and overreact to certain things. This is especially true when you eat certain foods or when you are under stress. There are four types of IBS. These may be determined based on the consistency of your stool:  IBS with diarrhea.  IBS with constipation.  Mixed IBS.  Unsubtyped IBS.  It is important to know which type of IBS you have. Some treatments are more likely to be helpful for certain types of IBS. What are the causes? The exact cause of IBS is not known. What increases the risk? You may have a higher risk of IBS if:  You are a woman.  You are younger than 53 years old.  You have a family history of IBS.  You have mental health problems.  You have had bacterial infection of your GI tract.  What are the signs or symptoms? Symptoms of IBS vary from person to person. The main symptom is abdominal pain or discomfort. Additional symptoms usually include one or more of the following:  Diarrhea, constipation, or both.  Abdominal swelling or bloating.  Feeling full or sick after eating a small or regular-size meal.  Frequent gas.  Mucus in the stool.  A feeling of having more stool left after a bowel movement.  Symptoms tend to come and go. They may be associated with stress, psychiatric conditions, or nothing at all. How is this diagnosed? There is no specific test to diagnose IBS. Your health care provider will make a diagnosis based on a physical exam, medical history, and your symptoms.  You may have other tests to rule out other conditions that may be causing your symptoms. These may include:  Blood tests.  X-rays.  CT scan.  Endoscopy and colonoscopy. This is a test in which your GI tract is viewed with a long, thin, flexible tube.  How is this treated? There is no cure for IBS, but treatment can help relieve symptoms. IBS treatment often includes:  Changes to your diet, such as: ? Eating more fiber. ? Avoiding foods that cause symptoms. ? Drinking more water. ? Eating regular, medium-sized portioned meals.  Medicines. These may include: ? Fiber supplements if you have constipation. ? Medicine to control diarrhea (antidiarrheal medicines). ? Medicine to help control muscle spasms in your GI tract (antispasmodic medicines). ? Medicines to help with any mental health issues, such as antidepressants or tranquilizers.  Therapy. ? Talk therapy may help with anxiety, depression, or other mental health issues that can make IBS symptoms worse.  Stress reduction. ? Managing your stress can help keep symptoms under control.  Follow these instructions at home:  Take medicines only as directed by your health care provider.  Eat a healthy diet. ? Avoid foods and drinks with added sugar. ? Include more whole grains, fruits, and vegetables gradually into your diet. This may be especially helpful if you have IBS with constipation. ? Avoid any foods and drinks that make your symptoms worse. These may include dairy products and caffeinated or carbonated drinks. ? Do not eat  large meals. ? Drink enough fluid to keep your urine clear or pale yellow.  Exercise regularly. Ask your health care provider for recommendations of good activities for you.  Keep all follow-up visits as directed by your health care provider. This is important. Contact a health care provider if:  You have constant pain.  You have trouble or pain with swallowing.  You have worsening  diarrhea. Get help right away if:  You have severe and worsening abdominal pain.  You have diarrhea and: ? You have a rash, stiff neck, or severe headache. ? You are irritable, sleepy, or difficult to awaken. ? You are weak, dizzy, or extremely thirsty.  You have bright red blood in your stool or you have black tarry stools.  You have unusual abdominal swelling that is painful.  You vomit continuously.  You vomit blood (hematemesis).  You have both abdominal pain and a fever. This information is not intended to replace advice given to you by your health care provider. Make sure you discuss any questions you have with your health care provider. Document Released: 08/21/2005 Document Revised: 01/21/2016 Document Reviewed: 05/08/2014 Elsevier Interactive Patient Education  2018 [employer].

## 2017-06-11 ENCOUNTER — Ambulatory Visit: Payer: BLUE CROSS/BLUE SHIELD

## 2017-06-11 ENCOUNTER — Encounter: Payer: Self-pay | Admitting: Podiatry

## 2017-06-11 ENCOUNTER — Ambulatory Visit (INDEPENDENT_AMBULATORY_CARE_PROVIDER_SITE_OTHER): Payer: BLUE CROSS/BLUE SHIELD | Admitting: Podiatry

## 2017-06-11 VITALS — BP 121/85 | HR 75

## 2017-06-11 DIAGNOSIS — M7662 Achilles tendinitis, left leg: Secondary | ICD-10-CM

## 2017-06-11 DIAGNOSIS — M7732 Calcaneal spur, left foot: Secondary | ICD-10-CM

## 2017-06-11 DIAGNOSIS — R52 Pain, unspecified: Secondary | ICD-10-CM

## 2017-06-11 MED ORDER — METHYLPREDNISOLONE 4 MG PO TBPK: ORAL_TABLET | ORAL | 0 refills | Status: DC

## 2017-06-11 MED FILL — METHYLPREDNISOLONE 4 MG TAB: 4 | 6 days supply | Qty: 21 | Fill #0

## 2017-06-11 NOTE — Progress Notes (Signed)
Subjective: Carol Jenkins presents the office today for continued reoccurrence pain to the back of the left heel and the Achilles tendon on the insertion to the heel. She says that the injection she did get some relief for some time however the pain is starting to come back. She states that she has difficulty walking because of the pain. She denies any change in activity level or recent injury. She has been doing some of the stretching exercises as discussed last appointment. She has no new concerns otherwise. Denies any systemic complaints such as fevers, chills, nausea, vomiting. No acute changes since last appointment, and no other complaints at this time.   Objective: AAO x3, NAD DP/PT pulses palpable bilaterally, CRT less than 3 seconds There is tenderness to the distal portion the Achilles tendon on the insertion to the calcaneus and more along the posterior lateral aspect of the heel. Achilles tendon appears to be intact. Equinus is present. Upon dorsiflexion ankle and stretching Achilles tendon she states this feels good. There is no pain with lateral compression of the calcaneus. There is no overlying edema, erythema, increase in warmth. No other areas of tenderness identified.  No open lesions or pre-ulcerative lesions.  No pain with calf compression, swelling, warmth, erythema  Assessment: Insertional Achilles tendinitis, heel spur  Plan: -All treatment options discussed with the patient including all alternatives, risks, complications.  -At this point I recommend holding off on a second steroid injection at this time and she agrees this. I did order Medrol Dosepak. I ordered a topical anti-inflammatory cream as well [employer]. NSAIDs as needed.  -Night splint was dispensed -Ice, heat contrast -Heel lift was dispensed for her shoes -Cam boot if needed -Gel offloading pad was dispensed -Discussed shoe gear modifications and orthotics -Follow up in 4 weeks or sooner if needed -Patient  encouraged to call the office with any questions, concerns, change in symptoms.   Celesta Gentile, DPM

## 2017-07-09 ENCOUNTER — Ambulatory Visit (INDEPENDENT_AMBULATORY_CARE_PROVIDER_SITE_OTHER): Payer: BLUE CROSS/BLUE SHIELD | Admitting: Podiatry

## 2017-07-09 DIAGNOSIS — M7732 Calcaneal spur, left foot: Secondary | ICD-10-CM

## 2017-07-09 DIAGNOSIS — M7662 Achilles tendinitis, left leg: Secondary | ICD-10-CM

## 2017-07-10 ENCOUNTER — Telehealth: Payer: Self-pay | Admitting: *Deleted

## 2017-07-10 DIAGNOSIS — T148XXA Other injury of unspecified body region, initial encounter: Secondary | ICD-10-CM

## 2017-07-10 NOTE — Telephone Encounter (Signed)
-----   Message from Trula Slade, DPM sent at 07/09/2017  4:59 PM EST ----- Can you please order an MRI of the left achilles tendon to rule out tera. She has pain for 1.5 - 2 years

## 2017-07-10 NOTE — Telephone Encounter (Signed)
Orders given to J. Quintana, RN, faxed to [facility]. 

## 2017-07-11 NOTE — Progress Notes (Signed)
Subjective: 51 year old female presents the office today for follow-up evaluation of posterior heel spur, Achilles tendinitis the left side.  She states that she is still experiencing quite a bit of pain.  This is been ongoing for 1.5-2 years.  Before I saw her she was treated in [place] as well as with Dr. Paulla Dolly.  The injection that Dr. Paulla Dolly did give her helped some but then did wear off.  She has continue with stretching, icing as well as offloading pads.  She denies any recent injury or trauma.  She has no other concerns today.  Her pain level is 8/10 at max. Denies any systemic complaints such as fevers, chills, nausea, vomiting. No acute changes since last appointment, and no other complaints at this time.   Objective: AAO x3, NAD DP/PT pulses palpable bilaterally, CRT less than 3 seconds There is continued tenderness to the posterior aspect of the Achilles tendon along the insertion into the posterior calcaneus on the left side.  Mild discomfort in the distal portion of the Achilles tendon.  Thompson test is negative and no defect is noted.  There is also mild discomfort along the lateral aspect of the Achilles tendon more long Kager's triangle. There is no significant discomfort on the peroneal tendon. .  There is no pain with lateral compression of the calcaneus.  No pain on the plantar fascia.  Equinus is present.  There is no other area of tenderness identified at this time. No open lesions or pre-ulcerative lesions.  No pain with calf compression, swelling, warmth, erythema  Assessment: Achilles tendinitis, posterior calcaneal spurring present.  Plan: -All treatment options discussed with the patient including all alternatives, risks, complications.  -At this time we discussed both conservative as well as surgical treatment options with her.  We discussed numerous options.  Conservatively we discussed shoe changes, inserts as well as continue icing and stretching.  We also discussed  physical therapy, EPAT.  She is interested in proceeding with a EPAT and literature was provided on this.  We also discussed surgical intervention for Achilles tendon repair as well as heel spur resection and gastrocnemius recession.  Given her prolonged nature of her symptoms I recommended an MRI to rule out partial tearing.  An MRI was ordered today. -Patient encouraged to call the office with any questions, concerns, change in symptoms.   Trula Slade DPM

## 2017-07-19 ENCOUNTER — Ambulatory Visit: Payer: BLUE CROSS/BLUE SHIELD | Admitting: Gastroenterology

## 2017-07-19 ENCOUNTER — Other Ambulatory Visit: Payer: BLUE CROSS/BLUE SHIELD

## 2017-07-23 ENCOUNTER — Telehealth: Payer: Self-pay | Admitting: Adult Health

## 2017-07-23 NOTE — Telephone Encounter (Signed)
Pt informed that she has standing refills at her previous pharmacy and that she need to have the refills transferred to the new pharmacy.  Pt expressed understanding and is agreeable.  Charyl Bigger, CMA

## 2017-07-23 NOTE — Telephone Encounter (Signed)
Pt request refill on Synthroid/ Levothyroxine 100 MCG tablets (takes 1 daily ) -- [facility]s [facility] to [facility] [address]--- pls call if any questions

## 2017-07-31 ENCOUNTER — Other Ambulatory Visit: Payer: Self-pay

## 2017-07-31 MED ORDER — SYNTHROID 100 MCG PO TABS: 100.0000 ug | ORAL_TABLET | Freq: Every day | ORAL | 0 refills | Status: DC

## 2017-07-31 NOTE — Progress Notes (Deleted)
Subjective:    Patient ID: Carol Jenkins, female    DOB: 01/20/1966, 51 y.o.   MRN: [id number]  HPI: 01/30/17 OV: Carol Jenkins presents to establish as a new pt.  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  PMH:  Hx of Hypothyroidism, Fibromyalgia (dx by Endocrinologist 2012), L Achille tendonitis (7/10 intermittent pain), bil great toe pain (3/10 that is aching in nature, father has hx of gout). 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  She has experienced increased fatigue/fibromyalgia pain since starting her new position.  She had CPE with full labs Jan 2018 with her previous PCP in [place]-records have been requested.  She reports drinking 80-90 ounces water daily and feels that she eats "pretty well during the day, it's when I gets home from work when I start snacking".   Of Note:  They lost their 47 yr old son in 2010 from [place].  She has been in/out of CBT-declined referral today.  08/02/17 OV: Carol Jenkins is here for f/u:   Patient Care Team    Relationship Specialty Notifications Start End  Esaw Grandchild, NP PCP - General Family Medicine  01/30/17     Patient Active Problem List   Diagnosis Date Noted  . S/P hysterectomy with oophorectomy 2014- endometriosis 05/28/2017  . Status post colonoscopy with polypectomy 05/28/2017  . H/O mammogram- 7/17- N 05/28/2017  . Epigastric pain- chronic pain 05/28/2017  . Fatigue 05/28/2017  . Health care maintenance 01/30/2017  . Toe pain, bilateral 01/30/2017  . Tendonitis, Achilles, left 01/30/2017  . Hypothyroidism (acquired) 01/30/2017  . Fibromyalgia 01/30/2017     Past Medical History:  Diagnosis Date  . Headache syndrome    hemiparegic  . Hyperlipidemia   . Neuromuscular disorder (HCC)    Fibromyalgia  . Thyroid disease      Past Surgical History:  Procedure Laterality Date  . ABDOMINAL HYSTERECTOMY     patrial  . CRUCIATE LIGAMENT REPAIR    . NASAL SEPTUM SURGERY    .  TUBAL LIGATION       Family History  Problem Relation Age of Onset  . Alcohol abuse Mother   . Hyperlipidemia Mother   . Hypertension Mother   . Alcohol abuse Father   . Hyperlipidemia Father   . Hypertension Father   . Alcohol abuse Brother   . Diabetes Son   . Cancer Maternal Uncle        lung  . Heart attack Paternal Uncle   . Hypothyroidism Paternal Uncle   . Alcohol abuse Maternal Grandmother   . Hyperlipidemia Maternal Grandmother   . Hypertension Maternal Grandmother   . Alcohol abuse Maternal Grandfather   . Cancer Paternal Grandmother        abdominal  . Heart attack Paternal Grandfather   . Alcohol abuse Brother   . Cancer Maternal Uncle        stomach  . Heart attack Paternal Uncle   . Hypothyroidism Paternal Uncle   . Heart attack Paternal Uncle   . Hypothyroidism Paternal Uncle      Social History   Substance and Sexual Activity  Drug Use No     Social History   Substance and Sexual Activity  Alcohol Use No     Social History   Tobacco Use  Smoking Status Former Smoker  . Packs/day: 1.00  . Years: 15.00  . Pack years: 15.00  . Last attempt  to quit: 09/04/1998  . Years since quitting: 18.9  Smokeless Tobacco Never Used     Outpatient Encounter Medications as of 08/02/2017  Medication Sig  . Cholecalciferol (VITAMIN D3) 2000 units capsule Take 2,000 Units by mouth daily.  Marland Kitchen levothyroxine (SYNTHROID, LEVOTHROID) 100 MCG tablet Take 1 tablet (100 mcg total) by mouth daily before breakfast.  . methylPREDNISolone (MEDROL DOSEPAK) 4 MG TBPK tablet Take as directed  . NON FORMULARY [facility]  Achilles Tendonitis Cream- Diclofenac 3%, Baclofen 2%, Bupivacaine 1%, Doxepin 5%, Gabapentin 6%, Ibuprofen 3%, Pentoxifylline 3% Apply 1-2 grams to affected area 3-4 times daily Qty. 120 gm 3 refills   No facility-administered encounter medications on file as of 08/02/2017.     Allergies: Shellfish allergy  There is no height or weight  on file to calculate BMI.  There were no vitals taken for this visit.      Review of Systems  Constitutional: Positive for fatigue. Negative for activity change, appetite change, chills, diaphoresis, fever and unexpected weight change.  HENT: Negative for congestion.   Eyes: Negative for visual disturbance.  Respiratory: Negative for cough, chest tightness, shortness of breath, wheezing and stridor.   Cardiovascular: Negative for chest pain, palpitations and leg swelling.  Gastrointestinal: Negative for abdominal distention, abdominal pain, constipation, diarrhea, nausea and vomiting.  Endocrine: Negative for cold intolerance, heat intolerance, polydipsia, polyphagia and polyuria.  Genitourinary: Negative for difficulty urinating and flank pain.  Musculoskeletal: Positive for arthralgias, joint swelling and myalgias. Negative for back pain, gait problem, neck pain and neck stiffness.  Skin: Negative for color change, pallor, rash and wound.  Allergic/Immunologic: Negative for immunocompromised state.  Neurological: Positive for headaches. Negative for dizziness, tremors and weakness.  Hematological: Does not bruise/bleed easily.  Psychiatric/Behavioral: Negative for agitation, decreased concentration, self-injury, sleep disturbance and suicidal ideas. The patient is not nervous/anxious and is not hyperactive.        Objective:   Physical Exam  Constitutional: She is oriented to person, place, and time. She appears well-developed and well-nourished. No distress.  HENT:  Head: Normocephalic and atraumatic.  Right Ear: Hearing, tympanic membrane, external ear and ear canal normal. No decreased hearing is noted.  Left Ear: Hearing, tympanic membrane, external ear and ear canal normal. No decreased hearing is noted.  Nose: Right sinus exhibits no maxillary sinus tenderness and no frontal sinus tenderness. Left sinus exhibits no maxillary sinus tenderness and no frontal sinus tenderness.   Mouth/Throat: Uvula is midline.  Eyes: Conjunctivae are normal. Pupils are equal, round, and reactive to light.  Neck: Normal range of motion. Neck supple.  Cardiovascular: Normal rate, regular rhythm, normal heart sounds and intact distal pulses.  No murmur heard. Pulmonary/Chest: Effort normal and breath sounds normal. No respiratory distress. She has no wheezes. She has no rales. She exhibits no tenderness.  Abdominal: Soft. Bowel sounds are normal. She exhibits no distension and no mass. There is no tenderness. There is no rebound and no guarding.  Musculoskeletal: Normal range of motion. She exhibits edema and tenderness.       Right knee: She exhibits normal range of motion and no swelling.       Left knee: She exhibits normal range of motion and no swelling.       Right ankle: She exhibits swelling. She exhibits normal range of motion. Achilles tendon exhibits no pain and no defect.       Left ankle: She exhibits swelling. She exhibits normal range of motion. Achilles tendon exhibits pain. Achilles  tendon exhibits no defect.  TTP bil lower extremities, no acute areas of tenderness, just diffuse.  Lymphadenopathy:    She has no cervical adenopathy.  Neurological: She is alert and oriented to person, place, and time. Coordination normal.  Skin: Skin is warm and dry. No rash noted. She is not diaphoretic. No erythema. No pallor.  Psychiatric: She has a normal mood and affect. Her behavior is normal. Judgment and thought content normal.  Nursing note and vitals reviewed.         Assessment & Plan:   No diagnosis found.  No problem-specific Assessment & Plan notes found for this encounter.    FOLLOW-UP:  No Follow-up on file.

## 2017-08-01 ENCOUNTER — Encounter: Payer: Self-pay | Admitting: Physician Assistant

## 2017-08-01 ENCOUNTER — Ambulatory Visit (INDEPENDENT_AMBULATORY_CARE_PROVIDER_SITE_OTHER): Payer: BLUE CROSS/BLUE SHIELD | Admitting: Physician Assistant

## 2017-08-01 VITALS — BP 118/82 | HR 66 | Ht 66.0 in | Wt 227.6 lb

## 2017-08-01 DIAGNOSIS — R1013 Epigastric pain: Secondary | ICD-10-CM

## 2017-08-01 NOTE — Progress Notes (Addendum)
Chief Complaint: Abdominal pain  HPI:    Carol Jenkins is a 51 year old Caucasian female with a past medical history as listed below including fibromyalgia, who was referred to me by Mina Marble D, NP for a complaint of abdominal pain.      Today, the patient tells me that she recently moved from [place] in February.  Patient describes that she developed this epigastric pain about a year ago.  She tells me that her physician was palpating her abdomen during her physical exam and "pushed on something" and ever since then patient has felt some discomfort in her epigastrium and tells me that it feels "lumpy or like gas".  Patient tells me it is very hard to describe but will come and go.  Patient also describes that sometimes this pain is "sharp and stabbing".  Patient describes that yesterday and she was driving her car and "bouncing up and down she felt a sharp pain".  She was holding her abdomen to help with this.  Patient tells me that it also woke her up in the middle of the night last night.  Patient also describes that leaning up against something can make this area hurt for a period of time or if she pushes on her stomach it will hurt.  Patient denies any connection with anything she is eating.  Food does not seem to bring on this pain or make it any worse.  She also denies any heartburn or reflux.  This pain is "unpredictable".    She tells me her primary care physician in [place] sent her for an EGD and colonoscopy.  She describes a finding of polyps on her "endoscopy", but tells me these were benign.  Everything else was "normal".  Patient tells me her primary care physician was discussing a "HIDA scan".    Patient also describes today some neurological events, telling me that she has had 3 episodes in the past month or so, the most recent 3 days days ago, when "I am drinking a cup of water and my brain is telling my hand to bring the cup down from my mouth but my hand will not move".  The patient  tells me that this makes her "choke".  This has happened in various circumstances of her life including driving a car, as well as sitting at home and one time at the gas station.  This is very worrisome to the patient.  She tells me that it may be her "fibromyalgia fog".    Patient denies fever, chills, blood in her stool, melena, weight loss, anorexia, nausea, vomiting, heartburn, reflux, dysphagia or change in bowel habits.  Past Medical History:  Diagnosis Date  . Fibromyalgia   . Headache syndrome    hemiparegic  . Hyperlipidemia   . Neuromuscular disorder (HCC)    Fibromyalgia  . Thyroid disease     Past Surgical History:  Procedure Laterality Date  . ABDOMINAL HYSTERECTOMY     patrial  . CRUCIATE LIGAMENT REPAIR    . NASAL SEPTUM SURGERY    . TUBAL LIGATION      Current Outpatient Medications  Medication Sig Dispense Refill  . Cholecalciferol (VITAMIN D3) 2000 units capsule Take 2,000 Units by mouth daily.    . methylPREDNISolone (MEDROL DOSEPAK) 4 MG TBPK tablet Take as directed 21 tablet 0  . NON FORMULARY [facility]  Achilles Tendonitis Cream- Diclofenac 3%, Baclofen 2%, Bupivacaine 1%, Doxepin 5%, Gabapentin 6%, Ibuprofen 3%, Pentoxifylline 3% Apply 1-2 grams to affected  area 3-4 times daily Qty. 120 gm 3 refills    . SYNTHROID 100 MCG tablet Take 1 tablet (100 mcg total) by mouth daily before breakfast. 90 tablet 0   No current facility-administered medications for this visit.     Allergies as of 08/01/2017 - Review Complete 06/11/2017  Allergen Reaction Noted  . Shellfish allergy Shortness Of Breath 01/30/2017    Family History  Problem Relation Age of Onset  . Alcohol abuse Mother   . Hyperlipidemia Mother   . Hypertension Mother   . Alcohol abuse Father   . Hyperlipidemia Father   . Hypertension Father   . Alcohol abuse Brother   . Diabetes Son   . Cancer Maternal Uncle        lung  . Heart attack Paternal Uncle   . Hypothyroidism Paternal  Uncle   . Alcohol abuse Maternal Grandmother   . Hyperlipidemia Maternal Grandmother   . Hypertension Maternal Grandmother   . Alcohol abuse Maternal Grandfather   . Cancer Paternal Grandmother        abdominal  . Heart attack Paternal Grandfather   . Alcohol abuse Brother   . Cancer Maternal Uncle        stomach  . Heart attack Paternal Uncle   . Hypothyroidism Paternal Uncle   . Heart attack Paternal Uncle   . Hypothyroidism Paternal Uncle     Social History   Socioeconomic History  . Marital status: Married    Spouse name: Not on file  . Number of children: Not on file  . Years of education: Not on file  . Highest education level: Not on file  Social Needs  . Financial resource strain: Not on file  . Food insecurity - worry: Not on file  . Food insecurity - inability: Not on file  . Transportation needs - medical: Not on file  . Transportation needs - non-medical: Not on file  Occupational History  . Not on file  Tobacco Use  . Smoking status: Former Smoker    Packs/day: 1.00    Years: 15.00    Pack years: 15.00    Last attempt to quit: 09/04/1998    Years since quitting: 18.9  . Smokeless tobacco: Never Used  Substance and Sexual Activity  . Alcohol use: No  . Drug use: No  . Sexual activity: Yes    Birth control/protection: Surgical  Other Topics Concern  . Not on file  Social History Narrative  . Not on file    Review of Systems:    Constitutional: No weight loss, fever or chills Skin: No rash  Cardiovascular: No chest pain Respiratory: No SOB  Gastrointestinal: See HPI and otherwise negative Genitourinary: No dysuria  Neurological: No headache Musculoskeletal: No new muscle or joint pain Hematologic: No bleeding  Psychiatric: No history of depression or anxiety   Physical Exam:  Vital signs: BP 118/82   Pulse 66   Ht 5\' 6"  (1.676 m)   Wt 227 lb 9.6 oz (103.2 kg)   SpO2 98%   BMI 36.74 kg/m   Constitutional: Overweight Caucasian female  appears to be in NAD, Well developed, Well nourished, alert and cooperative Head:  Normocephalic and atraumatic. Eyes:   PEERL, EOMI. No icterus. Conjunctiva pink. Ears:  Normal auditory acuity. Neck:  Supple Throat: Oral cavity and pharynx without inflammation, swelling or lesion.  Respiratory: Respirations even and unlabored. Lungs clear to auscultation bilaterally.   No wheezes, crackles, or rhonchi.  Cardiovascular: Normal S1, S2. No MRG.  Regular rate and rhythm. No peripheral edema, cyanosis or pallor.  Gastrointestinal:  Soft, nondistended, mild surface tenderness to palpation just superior to the bellybutton in a circular area the size of a tennis ball. No rebound or guarding. Normal bowel sounds. No appreciable masses or hepatomegaly. Rectal:  Not performed.  Msk:  Symmetrical without gross deformities. Without edema, no deformity or joint abnormality.  Neurologic:  Alert and  oriented x4;  grossly normal neurologically.  Skin:   Dry and intact without significant lesions or rashes. Psychiatric:  Demonstrates good judgement and reason without abnormal affect or behaviors.  No recent labs or imaging.  Assessment: 1.  Abdominal pain: Patient describes a tennis ball sized area just superior to her bellybutton and very superficial which is tender at different times of the day, unrelated to eating, previous EGD and colonoscopy in [place] within the year for evaluation which were normal per the patient, she does have fibromyalgia, pain started after "palpation in this area by my primary care doctor"; most likely this represents fibromyalgia/abdominal wall pain  Plan: 1.  Discussed with the patient that we will request records from her primary care physician as he was previously working this up in [place] as well as her recent EGD and colonoscopy done by gastroenterology. 2.  Patient described a few lapses in her brain-hand connection recently as well as this fibromyalgia.  Referred her to  neurology for further evaluation. Patient may benefit from Lyrica?  3.  Patient will follow in clinic per recommendations after review of records above.   Ellouise Newer, PA-C [facility] 08/01/2017, 9:36 AM   Addendum: Reviewed and agree with initial management. Pyrtle, Lajuan Lines, MD    Cc: Esaw Grandchild, NP   09/05/16 Received records from [facility], Dr. Jodean Lima but these were only in regards to h/o Repair of collateral Ligament, metacarpophalangeal joint on her left thumb, sx and follow up.  They did not pertain to GI or anything else.  JLL  10/10/17 received further records. [id number]  [facility]/01/[facility] Lauraine Rinne [facility].  Impression Z line regular, 38 cm from the incisors, gastritis and multiple gastric polyps.  01/27/16-colonoscopy: Impression a 4 mm polyp in the descending colon, repeat recommended in 5 years.  Office visit 09/19/16 for follow-up of test results.  Patient had described noncardiac chest pain, epigastric pain.  She had an EGD 09/01/2016.  She reported doing better at that time.  She had stopped Protonix and Carafate.  She took Zofran as needed.  An ultrasound 08/07/16 was reviewed showing no cholelithiasis or sonographic evidence of cholecystitis.  Echogenic liver with poor acoustic penetration suggestive of underlying ptosis.  It was recommended patient avoid NSAIDs.  JLL

## 2017-08-02 ENCOUNTER — Ambulatory Visit: Payer: 59 | Admitting: Adult Health

## 2017-08-02 ENCOUNTER — Ambulatory Visit
Admission: RE | Admit: 2017-08-02 | Discharge: 2017-08-02 | Disposition: A | Discharge status: Home / Self Care | Payer: BLUE CROSS/BLUE SHIELD | Source: Ambulatory Visit | Attending: Podiatry | Admitting: Podiatry

## 2017-08-20 ENCOUNTER — Ambulatory Visit (INDEPENDENT_AMBULATORY_CARE_PROVIDER_SITE_OTHER): Payer: BLUE CROSS/BLUE SHIELD | Admitting: Podiatry

## 2017-08-20 DIAGNOSIS — M7662 Achilles tendinitis, left leg: Secondary | ICD-10-CM

## 2017-08-20 DIAGNOSIS — M722 Plantar fascial fibromatosis: Secondary | ICD-10-CM

## 2017-08-31 ENCOUNTER — Ambulatory Visit (INDEPENDENT_AMBULATORY_CARE_PROVIDER_SITE_OTHER): Payer: BLUE CROSS/BLUE SHIELD | Admitting: Podiatry

## 2017-08-31 DIAGNOSIS — M7662 Achilles tendinitis, left leg: Secondary | ICD-10-CM

## 2017-08-31 DIAGNOSIS — M722 Plantar fascial fibromatosis: Secondary | ICD-10-CM

## 2017-09-07 ENCOUNTER — Ambulatory Visit: Payer: Self-pay | Admitting: Podiatry

## 2017-09-07 DIAGNOSIS — B351 Tinea unguium: Secondary | ICD-10-CM

## 2017-09-07 DIAGNOSIS — M7662 Achilles tendinitis, left leg: Secondary | ICD-10-CM

## 2017-09-11 NOTE — Progress Notes (Signed)
  Patient presents with ongoing pain in her posterior heel. She has had this problem for awhile and has tried various treatments with not much success.   Pain on palpation of Lt achilles tendon with pain and swelling noted at insertional site.   ESWT administered to area for 5 joules and tolerated well. Told patient to avoid NSAIDs and ice and to use her boot for 2-3 days post treatment

## 2017-09-13 NOTE — Progress Notes (Signed)
  Patient presents with ongoing pain in her posterior heel. She has had this problem for awhile and has tried various treatments with not much success. She said that the pain improved for a few days, then returned but she does admit to being on her feet for prolonged periods. She did purchase new shoes which seem to have helped   Pain on palpation of Lt achilles tendon with pain and swelling noted at insertional site.   ESWT administered to area for 6.7 joules and tolerated well. Told patient to avoid NSAIDs and ice and to use her boot for 2-3 days post treatment. She is to follow up in 1 week for her 3rd treatment

## 2017-09-17 ENCOUNTER — Encounter: Payer: Self-pay | Admitting: *Deleted

## 2017-09-17 NOTE — Progress Notes (Signed)
  Patient presents with ongoing pain in her posterior heel. She has had this problem for awhile and has tried various treatments with not much success. She said that he pain did improve a great deal after this treatment. , but she did notice a slight increase one day after prolonged walking but overall she is improving  Pain on palpation of Lt achilles tendon with pain and swelling noted at insertional site.   ESWT administered to area for 5.7 joules and tolerated well. Told patient to avoid NSAIDs and ice and to use her boot for 2-3 days post treatment. She is to follow up in 4 week for her 4th treatment

## 2017-09-24 ENCOUNTER — Telehealth: Payer: Self-pay | Admitting: Physician Assistant

## 2017-09-24 NOTE — Telephone Encounter (Signed)
Patient advised that there is no mention of needing MRI in Ellouise Newer, PA's note.  She is advised that Ellouise Newer, PA is out of the office and will call back when she returns

## 2017-09-26 ENCOUNTER — Ambulatory Visit: Payer: BLUE CROSS/BLUE SHIELD | Admitting: Internal Medicine

## 2017-10-05 ENCOUNTER — Other Ambulatory Visit: Payer: BLUE CROSS/BLUE SHIELD

## 2017-10-09 NOTE — Telephone Encounter
PCP Scuderi    [employer]/EMPLOYEE    Pt is requesting medical records of her colonoscopy and endoscopy [place]. Would like a call back if that could be done please.    [facility]  Ph. [phone]  Fax No. [phone] **ATTN: Victorino DikeJennifer

## 2017-10-09 NOTE — Telephone Encounter
PCP Scuderi    [employer]/EMPLOYEE    Pt is requesting medical records of her colonoscopy 01/27/16 and endoscopy on 09/01/16 to [place]. Would like a call back if that could be done please.    [facility]  Ph. [phone]  Fax No. [phone] **ATTN: Victorino DikeJennifer

## 2017-10-09 NOTE — Telephone Encounter
Spoke with patient, she states she already has a ROI on file, confirmed release and faxed records, patient notified.

## 2017-10-09 NOTE — Telephone Encounter (Signed)
Request for medical records sent to PCP again.

## 2017-10-09 NOTE — Telephone Encounter (Signed)
Patient notified and she will reach out to the primary care to see if she can get records sent.  I provided her the fax number. Desiree, will you please see if you still have the release and reach out to the primary care for GI records.

## 2017-10-09 NOTE — Telephone Encounter (Signed)
Please apologize to pt as I was out of town. I never received her records from PCP like we discussed, I was waiting on these to make sure they were discussing HIDA scan as next step in evaluation. Please see if the patient can get her old records/assist [place] in this process. Thanks-JLL

## 2017-10-10 ENCOUNTER — Telehealth: Payer: Self-pay

## 2017-10-10 DIAGNOSIS — R1011 Right upper quadrant pain: Secondary | ICD-10-CM

## 2017-10-10 NOTE — Telephone Encounter (Signed)
-----   Message from Levin Erp, [place] sent at 10/10/2017  3:35 PM EST ----- Regarding: Please call I received records from EGD/Colo and GI dr, but not PCP.  I did see a normal u/s in there and EGD. If she would like further workup, I would be ok with ordering hida scan with cck. Thanks-JLL

## 2017-10-11 NOTE — Telephone Encounter (Signed)
The pt has been advised and instructed.    You have been scheduled for a HIDA scan at [facility] (1st floor) on 10/18/17. Please arrive 15 minutes prior to your scheduled appointment at  711 am. Make certain not to have anything to eat or drink at least 6 hours prior to your test. Should this appointment date or time not work well for you, please call radiology scheduling at [phone].  _______________________________________________________ hepatobiliary (HIDA) scan is an imaging procedure used to diagnose problems in the liver, gallbladder and bile ducts. In the HIDA scan, a radioactive chemical or tracer is injected into a vein in your arm. The tracer is handled by the liver like bile. Bile is a fluid produced and excreted by your liver that helps your digestive system break down fats in the foods you eat. Bile is stored in your gallbladder and the gallbladder releases the bile when you eat a meal. A special nuclear medicine scanner (gamma camera) tracks the flow of the tracer from your liver into your gallbladder and small intestine.  During your HIDA scan  You'll be asked to change into a hospital gown before your HIDA scan begins. Your health care team will position you on a table, usually on your back. The radioactive tracer is then injected into a vein in your arm.The tracer travels through your bloodstream to your liver, where it's taken up by the bile-producing cells. The radioactive tracer travels with the bile from your liver into your gallbladder and through your bile ducts to your small intestine.You may feel some pressure while the radioactive tracer is injected into your vein. As you lie on the table, a special gamma camera is positioned over your abdomen taking pictures of the tracer as it moves through your body. The gamma camera takes pictures continually for about an hour. You'll need to keep still during the HIDA scan. This can become uncomfortable, but you may find that you  can lessen the discomfort by taking deep breaths and thinking about other things. Tell your health care team if you're uncomfortable. The radiologist will watch on a computer the progress of the radioactive tracer through your body. The HIDA scan may be stopped when the radioactive tracer is seen in the gallbladder and enters your small intestine. This typically takes about an hour. In some cases extra imaging will be performed if original images aren't satisfactory, if morphine is given to help visualize the gallbladder or if the medication CCK is given to look at the contraction of the gallbladder. This test typically takes 2 hours to complete. _______________________________________________________

## 2017-10-18 ENCOUNTER — Ambulatory Visit (HOSPITAL_COMMUNITY)
Admission: RE | Admit: 2017-10-18 | Discharge: 2017-10-18 | Disposition: A | Discharge status: Home / Self Care | Payer: PRIVATE HEALTH INSURANCE | Source: Ambulatory Visit | Attending: Physician Assistant | Admitting: Physician Assistant

## 2017-10-18 DIAGNOSIS — R1011 Right upper quadrant pain: Secondary | ICD-10-CM | POA: Diagnosis not present

## 2017-10-18 MED ORDER — TECHNETIUM TC 99M MEBROFENIN IV KIT
5.3000 | PACK | Freq: Once | INTRAVENOUS | Status: AC | PRN
  Administered 2017-10-18: 5.3 via INTRAVENOUS

## 2017-10-19 ENCOUNTER — Other Ambulatory Visit: Payer: Self-pay

## 2017-10-19 DIAGNOSIS — R932 Abnormal findings on diagnostic imaging of liver and biliary tract: Secondary | ICD-10-CM

## 2017-10-19 NOTE — Progress Notes (Signed)
The pt has been advised and instructed of the MRI    You have been scheduled for an MRI at [facility] on 10/25/17. Your appointment time is 8 am. Please arrive 15 minutes prior to your appointment time for registration purposes. Please make certain not to have anything to eat or drink 6 hours prior to your test. In addition, if you have any metal in your body, have a pacemaker or defibrillator, please be sure to let your ordering physician know. This test typically takes 45 minutes to 1 hour to complete. Should you need to reschedule, please call [phone] to do so.

## 2017-10-25 ENCOUNTER — Ambulatory Visit (HOSPITAL_COMMUNITY)
Admission: RE | Admit: 2017-10-25 | Discharge: 2017-10-25 | Disposition: A | Discharge status: Home / Self Care | Payer: PRIVATE HEALTH INSURANCE | Source: Ambulatory Visit | Attending: Physician Assistant | Admitting: Physician Assistant

## 2017-10-25 DIAGNOSIS — K7689 Other specified diseases of liver: Secondary | ICD-10-CM | POA: Insufficient documentation

## 2017-10-25 DIAGNOSIS — R932 Abnormal findings on diagnostic imaging of liver and biliary tract: Secondary | ICD-10-CM

## 2017-10-25 DIAGNOSIS — K760 Fatty (change of) liver, not elsewhere classified: Secondary | ICD-10-CM | POA: Diagnosis not present

## 2017-10-25 MED ORDER — GADOBENATE DIMEGLUMINE 529 MG/ML IV SOLN
20.0000 mL | Freq: Once | INTRAVENOUS | Status: AC | PRN
  Administered 2017-10-25: 20 mL via INTRAVENOUS

## 2017-10-26 ENCOUNTER — Telehealth: Payer: Self-pay | Admitting: Physician Assistant

## 2017-10-26 NOTE — Telephone Encounter (Signed)
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 Should I refer to liver clinic for further recs? Thanks-JLL  Anderson Malta do you want her referred to liver clinic?

## 2017-10-27 ENCOUNTER — Other Ambulatory Visit: Payer: Self-pay | Admitting: Adult Health

## 2017-10-29 ENCOUNTER — Other Ambulatory Visit: Payer: PRIVATE HEALTH INSURANCE

## 2017-10-29 DIAGNOSIS — E039 Hypothyroidism, unspecified: Secondary | ICD-10-CM

## 2017-10-29 NOTE — Telephone Encounter (Signed)
Pt needed TSH check.  Pt came in for labs today.  Will await results before refilling.  Charyl Bigger, CMA

## 2017-10-29 NOTE — Telephone Encounter (Signed)
The pt has been advised and the referral has been placed.  The pt will call back if no appt is made by Friday.

## 2017-10-29 NOTE — Telephone Encounter (Signed)
Sounds good. Thnx Kenney Houseman

## 2017-10-29 NOTE — Telephone Encounter (Signed)
I was waiting to hear back from Dr. Hilarie Fredrickson. We can go ahead and get referral in place anyways and they can discuss cyst with her. Thanks-JLL

## 2017-10-30 LAB — TSH: TSH: 1.96 u[IU]/mL (ref 0.450–4.500)

## 2017-11-13 ENCOUNTER — Encounter: Payer: Self-pay | Admitting: *Deleted

## 2017-11-21 ENCOUNTER — Encounter: Payer: Self-pay | Admitting: Adult Health

## 2017-11-21 ENCOUNTER — Ambulatory Visit (INDEPENDENT_AMBULATORY_CARE_PROVIDER_SITE_OTHER): Payer: PRIVATE HEALTH INSURANCE | Admitting: Adult Health

## 2017-11-21 VITALS — BP 130/81 | HR 88 | Temp 97.7°F | Ht 65.98 in | Wt 225.0 lb

## 2017-11-21 DIAGNOSIS — R110 Nausea: Secondary | ICD-10-CM

## 2017-11-21 DIAGNOSIS — R6889 Other general symptoms and signs: Secondary | ICD-10-CM

## 2017-11-21 LAB — POCT INFLUENZA A/B
Influenza A, POC: NEGATIVE
Influenza B, POC: NEGATIVE

## 2017-11-21 MED ORDER — OSELTAMIVIR PHOSPHATE 75 MG PO CAPS: 75.0000 mg | ORAL_CAPSULE | Freq: Two times a day (BID) | ORAL | 0 refills | Status: DC

## 2017-11-21 MED ORDER — ONDANSETRON HCL 8 MG PO TABS: 8.0000 mg | ORAL_TABLET | Freq: Three times a day (TID) | ORAL | 1 refills | Status: DC | PRN

## 2017-11-21 NOTE — Assessment & Plan Note (Signed)
Flu Test- neg, however sx's are so consistent with flu, therefore treating with Tamiflu Increase fluids/rest/vit-c 2,000mg /day Alternate OTC Acetaminophen and Ibuprofen. Ondansetron as needed for nausea. If your husband show's any flu-like symptoms, please call clinic and we will send in Tamiflu for him. Work Excuse provided, okay to return 11/25/17 Please call clinic with any questions/concerns. FEEL BETTER!

## 2017-11-21 NOTE — Patient Instructions (Signed)
Influenza, Adult Influenza ("the flu") is an infection in the lungs, nose, and throat (respiratory tract). It is caused by a virus. The flu causes many common cold symptoms, as well as a high fever and body aches. It can make you feel very sick. The flu spreads easily from person to person (is contagious). Getting a flu shot (influenza vaccination) every year is the best way to prevent the flu. Follow these instructions at home:  Take over-the-counter and prescription medicines only as told by your doctor.  Use a cool mist humidifier to add moisture (humidity) to the air in your home. This can make it easier to breathe.  Rest as needed.  Drink enough fluid to keep your pee (urine) clear or pale yellow.  Cover your mouth and nose when you cough or sneeze.  Wash your hands with soap and water often, especially after you cough or sneeze. If you cannot use soap and water, use hand sanitizer.  Stay home from work or school as told by your doctor. Unless you are visiting your doctor, try to avoid leaving home until your fever has been gone for 24 hours without the use of medicine.  Keep all follow-up visits as told by your doctor. This is important. How is this prevented?  Getting a yearly (annual) flu shot is the best way to avoid getting the flu. You may get the flu shot in late summer, fall, or winter. Ask your doctor when you should get your flu shot.  Wash your hands often or use hand sanitizer often.  Avoid contact with people who are sick during cold and flu season.  Eat healthy foods.  Drink plenty of fluids.  Get enough sleep.  Exercise regularly. Contact a doctor if:  You get new symptoms.  You have: ? Chest pain. ? Watery poop (diarrhea). ? A fever.  Your cough gets worse.  You start to have more mucus.  You feel sick to your stomach (nauseous).  You throw up (vomit). Get help right away if:  You start to be short of breath or have trouble breathing.  Your  skin or nails turn a bluish color.  You have very bad pain or stiffness in your neck.  You get a sudden headache.  You get sudden pain in your face or ear.  You cannot stop throwing up. This information is not intended to replace advice given to you by your health care provider. Make sure you discuss any questions you have with your health care provider. Document Released: 05/30/2008 Document Revised: 01/27/2016 Document Reviewed: 06/15/2015 Elsevier Interactive Patient Education  2017 [employer].  Flu Test- neg, however symmptoms are so consistent with flu, therefore treating with Tamiflu Increase fluids/rest/vit-c 2,000mg /day Alternate OTC Acetaminophen and Ibuprofen. Ondansetron as needed for nausea. If your husband show's any flu-like symptoms, please call clinic and we will send in Tamiflu for him. Work Excuse provided, okay to return 11/25/17 Please call clinic with any questions/concerns. FEEL BETTER!

## 2017-11-21 NOTE — Progress Notes (Signed)
Subjective:    Patient ID: Carol Jenkins, female    DOB: 03/10/1966, 52 y.o.   MRN: [id number]  HPI:  [name] presents with sudden onset of non-productive cough, fever (100), HA 8/10, sore throat 3/10, body aches, chills, clear nasal drainage, and nausea that all started last night. She denies CP/dyspnea/palpitations/vomiting. She had N/V/D for 1.5 weeks that resolved 11/15/17. She has been increasing fluids and has not tried any OTC medications to treat sx's. 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 S/P hysterectomy with oophorectomy 2014- endometriosis 05/28/2017  . Status post colonoscopy with polypectomy 05/28/2017  . H/O mammogram- 7/17- N 05/28/2017  . Epigastric pain- chronic pain 05/28/2017  . Fatigue 05/28/2017  . Health care maintenance 01/30/2017  . Toe pain, bilateral 01/30/2017  . Tendonitis, Achilles, left 01/30/2017  . Hypothyroidism (acquired) 01/30/2017  . Fibromyalgia 01/30/2017     Past Medical History:  Diagnosis Date  . Fatty liver 06/2016  . Fibromyalgia   . Gastric polyp   . Gastritis   . Headache syndrome    hemiparegic  . Hyperlipidemia   . Hypothyroidism   . Liver cyst   . Migraines   . Neuromuscular disorder (HCC)    Fibromyalgia  . Partial tear of Achilles tendon   . Thyroid disease   . Tubular adenoma of colon      Past Surgical History:  Procedure Laterality Date  . CRUCIATE LIGAMENT REPAIR    . CYSTOSCOPY    . DILATION AND CURETTAGE OF UTERUS    . NASAL SEPTUM SURGERY    . PARTIAL HYSTERECTOMY     with laparoscopy- completed hysterectomy (previously had left salpingo-oophorectomy)  . SALPINGOOPHORECTOMY Left   . TUBAL LIGATION       Family History  Problem Relation Age of Onset  . Alcohol abuse Mother   . Hyperlipidemia Mother   . Hypertension Mother    . Alcohol abuse Father   . Hyperlipidemia Father   . Hypertension Father   . Alcohol abuse Brother   . Diabetes Son   . Lung cancer Maternal Uncle   . Heart attack Paternal Uncle   . Hypothyroidism Paternal Uncle   . Alcohol abuse Maternal Grandmother   . Hyperlipidemia Maternal Grandmother   . Hypertension Maternal Grandmother   . Alcohol abuse Maternal Grandfather   . Cancer Paternal Grandmother        abdominal  . Heart attack Paternal Grandfather   . Alcohol abuse Brother   . Stomach cancer Maternal Uncle   . Heart attack Paternal Uncle   . Hypothyroidism Paternal Uncle   . Heart attack Paternal Uncle   . Hypothyroidism Paternal Uncle      Social History   Substance and Sexual Activity  Drug Use No     Social History   Substance and Sexual Activity  Alcohol Use No     Social History   Tobacco Use  Smoking Status Former Smoker  . Packs/day: 1.00  . Years: 15.00  . Pack years: 15.00  . Last attempt to quit: 09/04/1998  . Years since quitting: 19.2  Smokeless Tobacco Never Used     Outpatient Encounter Medications as of 11/21/2017  Medication Sig  . Ascorbic Acid (VITAMIN C PO) Take by mouth.  . Cholecalciferol (VITAMIN D3) 2000  units capsule Take 2,000 Units by mouth daily.  . Cyanocobalamin (B-12 PO) Take by mouth.  Salley Scarlet [facility]  Achilles Tendonitis Cream- Diclofenac 3%, Baclofen 2%, Bupivacaine 1%, Doxepin 5%, Gabapentin 6%, Ibuprofen 3%, Pentoxifylline 3% Apply 1-2 grams to affected area 3-4 times daily Qty. 120 gm 3 refills  . SYNTHROID 100 MCG tablet TAKE 1 TABLET BY MOUTH DAILY BEFORE BREAKFAST.  . [DISCONTINUED] methylPREDNISolone (MEDROL DOSEPAK) 4 MG TBPK tablet Take as directed   No facility-administered encounter medications on file as of 11/21/2017.     Allergies: Shellfish allergy; Azithromycin; and Erythromycin  Body mass index is 36.33 kg/m.  Blood pressure 130/81, pulse 88, temperature 97.7 F (36.5 C),  height 5' 5.98" (1.676 m), weight 225 lb (102.1 kg), SpO2 98 %.      Review of Systems  Constitutional: Positive for activity change, appetite change, chills, diaphoresis, fatigue and fever. Negative for unexpected weight change.  HENT: Positive for congestion, postnasal drip, rhinorrhea, sinus pressure, sneezing, sore throat and voice change. Negative for sinus pain.   Eyes: Negative for visual disturbance.  Respiratory: Positive for cough. Negative for chest tightness, shortness of breath, wheezing and stridor.   Cardiovascular: Negative for chest pain, palpitations and leg swelling.  Gastrointestinal: Positive for nausea. Negative for abdominal distention, abdominal pain, blood in stool, constipation, diarrhea and vomiting.  Genitourinary: Negative for difficulty urinating and flank pain.  Neurological: Positive for headaches. Negative for dizziness.  Hematological: Does not bruise/bleed easily.  Psychiatric/Behavioral: Positive for sleep disturbance.       Objective:   Physical Exam  Constitutional: She is oriented to person, place, and time. She appears well-developed and well-nourished. She has a sickly appearance. She appears ill. She appears distressed.  HENT:  Head: Normocephalic and atraumatic.  Right Ear: External ear normal. Tympanic membrane is bulging. Tympanic membrane is not erythematous. No decreased hearing is noted.  Left Ear: External ear normal. Tympanic membrane is bulging. Tympanic membrane is not erythematous. No decreased hearing is noted.  Nose: Mucosal edema and rhinorrhea present. Right sinus exhibits no maxillary sinus tenderness and no frontal sinus tenderness. Left sinus exhibits no maxillary sinus tenderness and no frontal sinus tenderness.  Mouth/Throat: Uvula is midline. Posterior oropharyngeal edema and posterior oropharyngeal erythema present. No oropharyngeal exudate or tonsillar abscesses.  Eyes: Conjunctivae are normal. Pupils are equal, round, and  reactive to light.  Neck: Normal range of motion. Neck supple.  Cardiovascular: Normal rate, regular rhythm, normal heart sounds and intact distal pulses.  No murmur heard. Pulmonary/Chest: Effort normal and breath sounds normal. No respiratory distress. She has no decreased breath sounds. She has no wheezes. She has no rhonchi. She has no rales. She exhibits no tenderness.  Lymphadenopathy:    She has no cervical adenopathy.  Neurological: She is alert and oriented to person, place, and time.  Skin: Skin is warm and dry. No rash noted. No erythema. No pallor.  Very warm to the touch  Psychiatric: She has a normal mood and affect. Her behavior is normal. Judgment and thought content normal.  Nursing note and vitals reviewed.         Assessment & Plan:

## 2017-11-23 LAB — BASIC METABOLIC PANEL: Glucose: 90

## 2017-11-24 LAB — CBC AND DIFFERENTIAL
HCT: 41 (ref 36–46)
HEMOGLOBIN: 13.9 (ref 12.0–16.0)
PLATELETS: 362 (ref 150–399)
WBC: 5.3

## 2017-11-24 LAB — IRON,TIBC AND FERRITIN PANEL: FERRITIN: 76

## 2017-11-24 LAB — BASIC METABOLIC PANEL
BUN: 10 (ref 4–21)
Creatinine: 0.8 (ref 0.5–1.1)
POTASSIUM: 4.3 (ref 3.4–5.3)
SODIUM: 140 (ref 137–147)

## 2017-11-24 LAB — POCT INR: INR: 0.9 (ref 0.9–1.1)

## 2017-11-24 LAB — HEPATIC FUNCTION PANEL
ALK PHOS: 71 (ref 25–125)
ALT: 21 (ref 7–35)
AST: 17 (ref 13–35)
BILIRUBIN, TOTAL: 0.4
Bilirubin, Direct: 0.1 (ref 0.01–0.4)

## 2017-11-24 LAB — IRON AND TIBC
Iron: 83
TIBC: 327

## 2017-11-24 LAB — VITAMIN D 25 HYDROXY (VIT D DEFICIENCY, FRACTURES): Vit D, 25-Hydroxy: 41

## 2017-11-24 LAB — HEPATITIS C ANTIBODY: HEP C AB: NEGATIVE

## 2017-11-27 ENCOUNTER — Ambulatory Visit: Payer: BLUE CROSS/BLUE SHIELD | Admitting: Internal Medicine

## 2018-01-30 ENCOUNTER — Telehealth: Payer: Self-pay | Admitting: Adult Health

## 2018-01-30 NOTE — Telephone Encounter (Signed)
Patient called requested to  spk w/ provider, has been feeling extremely tired no matter how much rest.   Patient thinks thyroid medicine needs to be tweaked.  --Made appointment for patient on Tues, 02/05/18 after explaining provider out of office til next week.  ---forwarding message to medical assistant --patient  Wants lab drawn on Monday-advised provider would have to Order what test are to be done if BD allowed so since Carol Jenkins is OOO not feasible---Pt request nurse call her Monday after speaking w/ Carol Jenkins so she can come by  Later on Monday after 3pm.  --glh

## 2018-01-31 ENCOUNTER — Other Ambulatory Visit: Payer: Self-pay

## 2018-01-31 ENCOUNTER — Other Ambulatory Visit: Payer: Self-pay | Admitting: Adult Health

## 2018-01-31 DIAGNOSIS — E039 Hypothyroidism, unspecified: Secondary | ICD-10-CM

## 2018-01-31 NOTE — Telephone Encounter (Signed)
Spoke with pt.  Pt will come in for TSH on 02/01/18 so that Valetta Fuller has results to review on her return to the office on 02/04/18.  Pt is keep appt scheduled for 02/05/18 in case she needs to be seen if labs are abnormal.  Pt expressed understanding and is agreeable.  Charyl Bigger, CMA

## 2018-01-31 NOTE — Telephone Encounter (Signed)
Patient was told in February by Joellen Jersey that she needs to "come in for a yearly physical and fasting labs ASAP" and never came in.    No further refills will be given beyond today: ok one 30-day supply of her meds only.    Patient will need to come in for chronic follow-up care as per her PCP prior to any further medications are dispensed.

## 2018-02-01 ENCOUNTER — Other Ambulatory Visit: Payer: Self-pay

## 2018-02-04 ENCOUNTER — Other Ambulatory Visit (INDEPENDENT_AMBULATORY_CARE_PROVIDER_SITE_OTHER): Payer: BLUE CROSS/BLUE SHIELD

## 2018-02-04 DIAGNOSIS — E039 Hypothyroidism, unspecified: Secondary | ICD-10-CM

## 2018-02-05 ENCOUNTER — Encounter: Payer: Self-pay | Admitting: Adult Health

## 2018-02-05 ENCOUNTER — Ambulatory Visit: Payer: BLUE CROSS/BLUE SHIELD | Admitting: Adult Health

## 2018-02-05 VITALS — BP 101/70 | HR 66 | Temp 98.0°F | Resp 16 | Wt 220.0 lb

## 2018-02-05 DIAGNOSIS — E039 Hypothyroidism, unspecified: Secondary | ICD-10-CM

## 2018-02-05 DIAGNOSIS — R5383 Other fatigue: Secondary | ICD-10-CM | POA: Diagnosis not present

## 2018-02-05 DIAGNOSIS — M797 Fibromyalgia: Secondary | ICD-10-CM

## 2018-02-05 LAB — T4, FREE: FREE T4: 1.18 ng/dL (ref 0.82–1.77)

## 2018-02-05 LAB — TSH: TSH: 1.04 u[IU]/mL (ref 0.450–4.500)

## 2018-02-05 LAB — T3, FREE: T3 FREE: 2.4 pg/mL (ref 2.0–4.4)

## 2018-02-05 MED ORDER — GABAPENTIN 100 MG PO CAPS: 100.0000 mg | ORAL_CAPSULE | Freq: Every day | ORAL | 0 refills | Status: DC

## 2018-02-05 NOTE — Assessment & Plan Note (Signed)
Increase water intake, strive for at least 110 ounces/day.   Follow Heart Healthy diet Increase regular exercise.  Recommend at least 30 minutes daily, 5 days per week of walking, jogging, biking, swimming, YouTube/Pinterest workout videos. Thyroid panel is normal- continue current dose of levothyroxine. We will call you when lab results are available. Please start Gabapentin 100mg  QHS. Follow-up in 2 weeks.

## 2018-02-05 NOTE — Assessment & Plan Note (Signed)
02/04/18- All WNL TSH- 1.040 T4, Free- 1.18 T3, Free- 2.4 Continue on Synthroid 100 mcg QD

## 2018-02-05 NOTE — Patient Instructions (Addendum)
Fatigue Fatigue is feeling tired all of the time, a lack of energy, or a lack of motivation. Occasional or mild fatigue is often a normal response to activity or life in general. However, long-lasting (chronic) or extreme fatigue may indicate an underlying medical condition. Follow these instructions at home: Watch your fatigue for any changes. The following actions may help to lessen any discomfort you are feeling:  Talk to your health care provider about how much sleep you need each night. Try to get the required amount every night.  Take medicines only as directed by your health care provider.  Eat a healthy and nutritious diet. Ask your health care provider if you need help changing your diet.  Drink enough fluid to keep your urine clear or pale yellow.  Practice ways of relaxing, such as yoga, meditation, massage therapy, or acupuncture.  Exercise regularly.  Change situations that cause you stress. Try to keep your work and personal routine reasonable.  Do not abuse illegal drugs.  Limit alcohol intake to no more than 1 drink per day for nonpregnant women and 2 drinks per day for men. One drink equals 12 ounces of beer, 5 ounces of wine, or 1 ounces of hard liquor.  Take a multivitamin, if directed by your health care provider.  Contact a health care provider if:  Your fatigue does not get better.  You have a fever.  You have unintentional weight loss or gain.  You have headaches.  You have difficulty: ? Falling asleep. ? Sleeping throughout the night.  You feel angry, guilty, anxious, or sad.  You are unable to have a bowel movement (constipation).  You skin is dry.  Your legs or another part of your body is swollen. Get help right away if:  You feel confused.  Your vision is blurry.  You feel faint or pass out.  You have a severe headache.  You have severe abdominal, pelvic, or back pain.  You have chest pain, shortness of breath, or an irregular or  fast heartbeat.  You are unable to urinate or you urinate less than normal.  You develop abnormal bleeding, such as bleeding from the rectum, vagina, nose, lungs, or nipples.  You vomit blood.  You have thoughts about harming yourself or committing suicide.  You are worried that you might harm someone else. This information is not intended to replace advice given to you by your health care provider. Make sure you discuss any questions you have with your health care provider. Document Released: 06/18/2007 Document Revised: 01/27/2016 Document Reviewed: 12/23/2013 Elsevier Interactive Patient Education  2018 [employer].  Increase water intake, strive for at least 110 ounces/day.   Follow Heart Healthy diet Increase regular exercise.  Recommend at least 30 minutes daily, 5 days per week of walking, jogging, biking, swimming, YouTube/Pinterest workout videos. Thyroid panel is normal- continue current dose of levothyroxine. We will call you when lab results are available. Please start Gabapentin 100mg  QHS. Follow-up in 2 weeks. FEEL BETTER!

## 2018-02-05 NOTE — Assessment & Plan Note (Signed)
Started on Gabapentin 100 mg QHS Trailed on Cymbalta about 5 yrs ago- did not improve sx's

## 2018-02-05 NOTE — Progress Notes (Signed)
Subjective:    Patient ID: Carol Jenkins, female    DOB: 1965/11/05, 52 y.o.   MRN: [id number]  HPI:  Carol Jenkins presents increased fatigue since Mid March She reports difficulty falling asleep, >1 hr, due to "being so over tired". She has been napping frequently in afternoon due to increased fatigue. She reports going to bed consistently ever night at 2200 and waking 0600 She denies snoring or hx of OSA She denies any caffeine/ETOH/tobacco use She denies recent increase in stress/anxiety She denies formal exercise She has been trying to follow heart healthy diet  She has fibromyalgia, and feels "that my legs are moving all the time when I sit or go to bed"  Recent thyroid panel- TSH- 1.040 T4, free- 1.18 T3, free- 2.4   Patient Care Team    Relationship Specialty Notifications Start End  Esaw Grandchild, NP PCP - General Family Medicine  01/30/17     Patient Active Problem List   Diagnosis Date Noted  . Flu-like symptoms 11/21/2017  . Nausea 11/21/2017  . S/P hysterectomy with oophorectomy 2014- endometriosis 05/28/2017  . Status post colonoscopy with polypectomy 05/28/2017  . H/O mammogram- 7/17- N 05/28/2017  . Epigastric pain- chronic pain 05/28/2017  . Fatigue 05/28/2017  . Health care maintenance 01/30/2017  . Toe pain, bilateral 01/30/2017  . Tendonitis, Achilles, left 01/30/2017  . Hypothyroidism 01/30/2017  . Fibromyalgia 01/30/2017     Past Medical History:  Diagnosis Date  . Fatty liver 06/2016  . Fibromyalgia   . Gastric polyp   . Gastritis   . Headache syndrome    hemiparegic  . Hyperlipidemia   . Hypothyroidism   . Liver cyst   . Migraines   . Neuromuscular disorder (HCC)    Fibromyalgia  . Partial tear of Achilles tendon   . Thyroid disease   . Tubular adenoma of colon      Past Surgical History:  Procedure Laterality Date  . CRUCIATE LIGAMENT REPAIR    . CYSTOSCOPY    . DILATION AND CURETTAGE OF UTERUS    . NASAL SEPTUM SURGERY    .  PARTIAL HYSTERECTOMY     with laparoscopy- completed hysterectomy (previously had left salpingo-oophorectomy)  . SALPINGOOPHORECTOMY Left   . TUBAL LIGATION       Family History  Problem Relation Age of Onset  . Alcohol abuse Mother   . Hyperlipidemia Mother   . Hypertension Mother   . Alcohol abuse Father   . Hyperlipidemia Father   . Hypertension Father   . Alcohol abuse Brother   . Diabetes Son   . Lung cancer Maternal Uncle   . Heart attack Paternal Uncle   . Hypothyroidism Paternal Uncle   . Alcohol abuse Maternal Grandmother   . Hyperlipidemia Maternal Grandmother   . Hypertension Maternal Grandmother   . Alcohol abuse Maternal Grandfather   . Cancer Paternal Grandmother        abdominal  . Heart attack Paternal Grandfather   . Alcohol abuse Brother   . Stomach cancer Maternal Uncle   . Heart attack Paternal Uncle   . Hypothyroidism Paternal Uncle   . Heart attack Paternal Uncle   . Hypothyroidism Paternal Uncle      Social History   Substance and Sexual Activity  Drug Use No     Social History   Substance and Sexual Activity  Alcohol Use No     Social History   Tobacco Use  Smoking Status Former Smoker  . Packs/day:  1.00  . Years: 15.00  . Pack years: 15.00  . Last attempt to quit: 09/04/1998  . Years since quitting: 19.4  Smokeless Tobacco Never Used     Outpatient Encounter Medications as of 02/05/2018  Medication Sig  . Ascorbic Acid (VITAMIN C PO) Take by mouth.  . Cholecalciferol (VITAMIN D3) 2000 units capsule Take 2,000 Units by mouth daily.  . Cyanocobalamin (B-12 PO) Take by mouth.  Salley Scarlet [facility]  Achilles Tendonitis Cream- Diclofenac 3%, Baclofen 2%, Bupivacaine 1%, Doxepin 5%, Gabapentin 6%, Ibuprofen 3%, Pentoxifylline 3% Apply 1-2 grams to affected area 3-4 times daily Qty. 120 gm 3 refills  . [DISCONTINUED] SYNTHROID 100 MCG tablet TAKE 1 TABLET BY MOUTH DAILY BEFORE BREAKFAST.  Marland Kitchen gabapentin (NEURONTIN)  100 MG capsule Take 1 capsule (100 mg total) by mouth at bedtime.  . [DISCONTINUED] ondansetron (ZOFRAN) 8 MG tablet Take 1 tablet (8 mg total) by mouth every 8 (eight) hours as needed for nausea or vomiting.  . [DISCONTINUED] oseltamivir (TAMIFLU) 75 MG capsule Take 1 capsule (75 mg total) by mouth 2 (two) times daily.   No facility-administered encounter medications on file as of 02/05/2018.     Allergies: Shellfish allergy; Azithromycin; and Erythromycin  Body mass index is 35.53 kg/m.  Blood pressure 101/70, pulse 66, temperature 98 F (36.7 C), resp. rate 16, weight 220 lb (99.8 kg), SpO2 100 %.   Review of Systems  Constitutional: Positive for activity change and fatigue. Negative for appetite change, chills, diaphoresis, fever and unexpected weight change.  Eyes: Negative for visual disturbance.  Respiratory: Negative for cough, chest tightness, shortness of breath, wheezing and stridor.   Cardiovascular: Negative for chest pain, palpitations and leg swelling.  Gastrointestinal: Negative for abdominal distention, abdominal pain, blood in stool, constipation, diarrhea, nausea and vomiting.  Endocrine: Negative for cold intolerance, heat intolerance, polydipsia, polyphagia and polyuria.  Genitourinary: Negative for difficulty urinating and flank pain.  Neurological: Negative for dizziness.  Hematological: Does not bruise/bleed easily.       Objective:   Physical Exam  Constitutional: She is oriented to person, place, and time. She appears well-developed and well-nourished. No distress.  HENT:  Head: Normocephalic and atraumatic.  Right Ear: External ear normal.  Left Ear: External ear normal.  Cardiovascular: Normal rate, regular rhythm, normal heart sounds and intact distal pulses.  No murmur heard. Pulmonary/Chest: Effort normal and breath sounds normal. No stridor. No respiratory distress. She has no wheezes. She exhibits no tenderness.  Neurological: She is alert and  oriented to person, place, and time.  Skin: Skin is warm and dry. Capillary refill takes less than 2 seconds. No rash noted. She is not diaphoretic. No erythema. No pallor.  Psychiatric: She has a normal mood and affect. Her behavior is normal. Judgment and thought content normal.  Nursing note and vitals reviewed.     Assessment & Plan:   1. Fatigue, unspecified type   2. Fibromyalgia   3. Hypothyroidism, unspecified type     Fatigue Increase water intake, strive for at least 110 ounces/day.   Follow Heart Healthy diet Increase regular exercise.  Recommend at least 30 minutes daily, 5 days per week of walking, jogging, biking, swimming, YouTube/Pinterest workout videos. Thyroid panel is normal- continue current dose of levothyroxine. We will call you when lab results are available. Please start Gabapentin 100mg  QHS. Follow-up in 2 weeks.  Hypothyroidism 02/04/18- All WNL TSH- 1.040 T4, Free- 1.18 T3, Free- 2.4 Continue on Synthroid 100 mcg QD  Fibromyalgia Started on Gabapentin 100 mg QHS Trailed on Cymbalta about 5 yrs ago- did not improve sx's    FOLLOW-UP:  Return for Regular Follow Up, Fatigue.

## 2018-02-06 LAB — CBC
HEMATOCRIT: 40.4 % (ref 34.0–46.6)
HEMOGLOBIN: 13.5 g/dL (ref 11.1–15.9)
MCH: 29.7 pg (ref 26.6–33.0)
MCHC: 33.4 g/dL (ref 31.5–35.7)
MCV: 89 fL (ref 79–97)
Platelets: 387 10*3/uL (ref 150–450)
RBC: 4.54 x10E6/uL (ref 3.77–5.28)
RDW: 13.6 % (ref 12.3–15.4)
WBC: 8 10*3/uL (ref 3.4–10.8)

## 2018-02-06 LAB — COMPREHENSIVE METABOLIC PANEL
ALBUMIN: 4.4 g/dL (ref 3.5–5.5)
ALK PHOS: 84 IU/L (ref 39–117)
ALT: 21 IU/L (ref 0–32)
AST: 22 IU/L (ref 0–40)
Albumin/Globulin Ratio: 1.5 (ref 1.2–2.2)
BUN / CREAT RATIO: 24 — AB (ref 9–23)
BUN: 18 mg/dL (ref 6–24)
Bilirubin Total: 0.3 mg/dL (ref 0.0–1.2)
CALCIUM: 10.1 mg/dL (ref 8.7–10.2)
CO2: 24 mmol/L (ref 20–29)
CREATININE: 0.75 mg/dL (ref 0.57–1.00)
Chloride: 101 mmol/L (ref 96–106)
GFR calc Af Amer: 105 mL/min/{1.73_m2} (ref >59)
GFR calc non Af Amer: 91 mL/min/{1.73_m2} (ref >59)
GLOBULIN, TOTAL: 3 g/dL (ref 1.5–4.5)
Glucose: 84 mg/dL (ref 65–99)
Potassium: 4.5 mmol/L (ref 3.5–5.2)
SODIUM: 140 mmol/L (ref 134–144)
Total Protein: 7.4 g/dL (ref 6.0–8.5)

## 2018-02-06 LAB — TSH: TSH: 1.48 u[IU]/mL (ref 0.450–4.500)

## 2018-02-06 LAB — SEDIMENTATION RATE: Sed Rate: 24 mm/hr (ref 0–40)

## 2018-02-06 LAB — FERRITIN: FERRITIN: 90 ng/mL (ref 15–150)

## 2018-02-06 LAB — VITAMIN B12: VITAMIN B 12: 489 pg/mL (ref 232–1245)

## 2018-02-06 LAB — VITAMIN D 25 HYDROXY (VIT D DEFICIENCY, FRACTURES): VIT D 25 HYDROXY: 47.5 ng/mL (ref 30.0–100.0)

## 2018-02-06 LAB — C-REACTIVE PROTEIN: CRP: 4.3 mg/L (ref 0.0–4.9)

## 2018-02-06 NOTE — Telephone Encounter (Signed)
Carol Jenkins,   I am sorry but I do not know what else I need to do with this message.  I already addressed this 6 days ago, yet it was again routed to me yesterday at 4:33p.  ( In my prior note I said it was okay to give patient a one time 30-day supply of her meds only,  but no further refills on anything until she is seen by Valetta Fuller in the office and fasting labs are obtained.)   -Thanks for clarifying this for me.  Dr Jenetta Downer

## 2018-02-26 ENCOUNTER — Encounter: Payer: Self-pay | Admitting: Adult Health

## 2018-02-26 ENCOUNTER — Ambulatory Visit (INDEPENDENT_AMBULATORY_CARE_PROVIDER_SITE_OTHER): Payer: BLUE CROSS/BLUE SHIELD | Admitting: Adult Health

## 2018-02-26 VITALS — BP 112/69 | HR 73 | Ht 66.0 in | Wt 217.6 lb

## 2018-02-26 DIAGNOSIS — Z0000 Encounter for general adult medical examination without abnormal findings: Secondary | ICD-10-CM

## 2018-02-26 DIAGNOSIS — M797 Fibromyalgia: Secondary | ICD-10-CM

## 2018-02-26 DIAGNOSIS — R6882 Decreased libido: Secondary | ICD-10-CM | POA: Insufficient documentation

## 2018-02-26 DIAGNOSIS — R0789 Other chest pain: Secondary | ICD-10-CM | POA: Diagnosis not present

## 2018-02-26 MED ORDER — GABAPENTIN 100 MG PO CAPS: 100.0000 mg | ORAL_CAPSULE | Freq: Every day | ORAL | 2 refills | Status: DC

## 2018-02-26 MED ORDER — ALBUTEROL SULFATE HFA 108 (90 BASE) MCG/ACT IN AERS: 2.0000 | INHALATION_SPRAY | Freq: Four times a day (QID) | RESPIRATORY_TRACT | 1 refills | Status: DC | PRN

## 2018-02-26 NOTE — Assessment & Plan Note (Addendum)
Wheezing noted RML and RLL Albuterol PRN

## 2018-02-26 NOTE — Patient Instructions (Addendum)
Myofascial Pain Syndrome and Fibromyalgia Myofascial pain syndrome and fibromyalgia are both pain disorders. This pain may be felt mainly in your muscles.  Myofascial pain syndrome: ? Always has trigger points or tender points in the muscle that will cause pain when pressed. The pain may come and go. ? Usually affects your neck, upper back, and shoulder areas. The pain often radiates into your arms and hands.  Fibromyalgia: ? Has muscle pains and tenderness that come and go. ? Is often associated with fatigue and sleep disturbances. ? Has trigger points. ? Tends to be long-lasting (chronic), but is not life-threatening.  Fibromyalgia and myofascial pain are not the same. However, they often occur together. If you have both conditions, each can make the other worse. Both are common and can cause enough pain and fatigue to make day-to-day activities difficult. What are the causes? The exact causes of fibromyalgia and myofascial pain are not known. People with certain gene types may be more likely to develop fibromyalgia. Some factors can be triggers for both conditions, such as:  Spine disorders.  Arthritis.  Severe injury (trauma) and other physical stressors.  Being under a lot of stress.  A medical illness.  What are the signs or symptoms? Fibromyalgia The main symptom of fibromyalgia is widespread pain and tenderness in your muscles. This can vary over time. Pain is sometimes described as stabbing, shooting, or burning. You may have tingling or numbness, too. You may also have sleep problems and fatigue. You may wake up feeling tired and groggy (fibro fog). Other symptoms may include:  Bowel and bladder problems.  Headaches.  Visual problems.  Problems with odors and noises.  Depression or mood changes.  Painful menstrual periods (dysmenorrhea).  Dry skin or eyes.  Myofascial pain syndrome Symptoms of myofascial pain syndrome include:  Tight, ropy bands of  muscle.  Uncomfortable sensations in muscular areas, such as: ? Aching. ? Cramping. ? Burning. ? Numbness. ? Tingling. ? Muscle weakness.  Trouble moving certain muscles freely (range of motion).  How is this diagnosed? There are no specific tests to diagnose fibromyalgia or myofascial pain syndrome. Both can be hard to diagnose because their symptoms are common in many other conditions. Your health care provider may suspect one or both of these conditions based on your symptoms and medical history. Your health care provider will also do a physical exam. The key to diagnosing fibromyalgia is having pain, fatigue, and other symptoms for more than three months that cannot be explained by another condition. The key to diagnosing myofascial pain syndrome is finding trigger points in muscles that are tender and cause pain elsewhere in your body (referred pain). How is this treated? Treating fibromyalgia and myofascial pain often requires a team of health care providers. This usually starts with your primary provider and a physical therapist. You may also find it helpful to work with alternative health care providers, such as massage therapists or acupuncturists. Treatment for fibromyalgia may include medicines. This may include nonsteroidal anti-inflammatory drugs (NSAIDs), along with other medicines. Treatment for myofascial pain may also include:  NSAIDs.  Cooling and stretching of muscles.  Trigger point injections.  Sound wave (ultrasound) treatments to stimulate muscles.  Follow these instructions at home:  Take medicines only as directed by your health care provider.  Exercise as directed by your health care provider or physical therapist.  Try to avoid stressful situations.  Practice relaxation techniques to control your stress. You may want to try: ? Biofeedback. ? Visual   imagery. ? Hypnosis. ? Muscle relaxation. ? Yoga. ? Meditation.  Talk to your health care provider  about alternative treatments, such as acupuncture or massage treatment.  Maintain a healthy lifestyle. This includes eating a healthy diet and getting enough sleep.  Consider joining a support group.  Do not do activities that stress or strain your muscles. That includes repetitive motions and heavy lifting. Where to find more information:  National Fibromyalgia Association: www.fmaware.Carol Jenkins: www.arthritis.org  American Chronic Pain Association: OEMDeals.dk Contact a health care provider if:  You have new symptoms.  Your symptoms get worse.  You have side effects from your medicines.  You have trouble sleeping.  Your condition is causing depression or anxiety. This information is not intended to replace advice given to you by your health care provider. Make sure you discuss any questions you have with your health care provider. Document Released: 08/21/2005 Document Revised: 01/27/2016 Document Reviewed: 05/27/2014 Elsevier Interactive Patient Education  2018 [employer].  Carol Jenkins What is this medicine? Carol (al Carol Jenkins) is a bronchodilator. It helps open up the airways in your lungs to make it easier to breathe. This medicine is used to treat and to prevent bronchospasm. This medicine may be used for other purposes; ask your health care provider or pharmacist if you have questions. COMMON BRAND NAME(S): Proair HFA, Proventil, Proventil HFA, Respirol, Ventolin, Ventolin HFA What should I tell my health care provider before I take this medicine? They need to know if you have any of the following conditions: -diabetes -heart disease or irregular heartbeat -high blood pressure -pheochromocytoma -seizures -thyroid disease -an unusual or allergic reaction to Carol, levalbuterol, sulfites, other medicines, foods, dyes, or preservatives -pregnant or trying to get pregnant -breast-feeding How  should I use this medicine? This medicine is for inhalation through the mouth. Follow the directions on your prescription label. Take your medicine at regular intervals. Do not use more often than directed. Make sure that you are using your inhaler correctly. Ask you doctor or health care provider if you have any questions. Talk to your pediatrician regarding the use of this medicine in children. Special care may be needed. Overdosage: If you think you have taken too much of this medicine contact a poison control center or emergency room at once. NOTE: This medicine is only for you. Do not share this medicine with others. What if I miss a dose? If you miss a dose, use it as soon as you can. If it is almost time for your next dose, use only that dose. Do not use double or extra doses. What may interact with this medicine? -anti-infectives like chloroquine and pentamidine -caffeine -cisapride -diuretics -medicines for colds -medicines for depression or for emotional or psychotic conditions -medicines for weight loss including some herbal products -methadone -some antibiotics like clarithromycin, erythromycin, levofloxacin, and linezolid -some heart medicines -steroid hormones like dexamethasone, cortisone, hydrocortisone -theophylline -thyroid hormones This list may not describe all possible interactions. Give your health care provider a list of all the medicines, herbs, non-prescription drugs, or dietary supplements you use. Also tell them if you smoke, drink alcohol, or use illegal drugs. Some items may interact with your medicine. What should I watch for while using this medicine? Tell your doctor or health care professional if your symptoms do not improve. Do not use extra Carol. If your asthma or bronchitis gets worse while you are using this medicine, call your doctor right away. If your mouth gets dry try  chewing sugarless gum or sucking hard candy. Drink water as directed. What  side effects may I notice from receiving this medicine? Side effects that you should report to your doctor or health care professional as soon as possible: -allergic reactions like skin rash, itching or hives, swelling of the face, lips, or tongue -breathing problems -chest pain -feeling faint or lightheaded, falls -high blood pressure -irregular heartbeat -fever -muscle cramps or weakness -pain, tingling, numbness in the hands or feet -vomiting Side effects that usually do not require medical attention (report to your doctor or health care professional if they continue or are bothersome): -cough -difficulty sleeping -headache -nervousness or trembling -stomach upset -stuffy or runny nose -throat irritation -unusual taste This list may not describe all possible side effects. Call your doctor for medical advice about side effects. You may report side effects to FDA at 1-800-FDA-1088. Where should I keep my medicine? Keep out of the reach of children. Store at room temperature between 15 and 30 degrees C (59 and 86 degrees F). The contents are under pressure and may burst when exposed to heat or flame. Do not freeze. This medicine does not work as well if it is too cold. Throw away any unused medicine after the expiration date. Inhalers need to be thrown away after the labeled number of puffs have been used or by the expiration date; whichever comes first. Ventolin HFA should be thrown away 12 months after removing from foil pouch. Check the instructions that come with your medicine. NOTE: This sheet is a summary. It may not cover all possible information. If you have questions about this medicine, talk to your doctor, pharmacist, or health care provider.  2018 Elsevier/Gold Standard (2013-02-06 10:57:17)  Continue with nightly Gabapentin 100mg .  FDA has not endorsed use of CBD oil, however you can do your own research- local [employer]. Use Carol inhaler for wheezing/chest  tightness. Referral to OB/GYN placed. Follow-up in 6 months for chronic medical conditions. GREAT JOB on diet, regular exercise, and excellent water intake. NICE TO SEE YOU!

## 2018-02-26 NOTE — Progress Notes (Signed)
Subjective:    Patient ID: Carol Jenkins, female    DOB: February 21, 1966, 52 y.o.   MRN: [id number]  HPI:  02/05/18 OV: Carol Jenkins presents increased fatigue since Mid March She reports difficulty falling asleep, >1 hr, due to "being so over tired". She has been napping frequently in afternoon due to increased fatigue. She reports going to bed consistently ever night at 2200 and waking 0600 She denies snoring or hx of OSA She denies any caffeine/ETOH/tobacco use She denies recent increase in stress/anxiety She denies formal exercise She has been trying to follow heart healthy diet  She has fibromyalgia, and feels "that my legs are moving all the time when I sit or go to bed"  Recent thyroid panel- TSH- 1.040 T4, free- 1.18 T3, free- 2.4  02/26/18 OV: Carol Jenkins presents for f/u: fatigue and fibromyalgia. She was started on Gabapentin 100mg  QHS approx 3 weeks ago She reports improved sleep, however will still wake up in pain several times a night She is not interested in increasing Gabapentin dosage, however is considering using CBD oil for pain control She also reports "chest tightness" when taking a deep breath, denies CP/palpitations/jaw pain/scapula pain. She reports annual bronchitis infections, quit tobacco use 2000 She also reports low libido since hysterectomy- does not have local OB/GYN- would like referral She has been walking daily and riding home stationary bike daily. She estimates to drink 8-10 glasses water/day and has eliminated all sugar and processed flour products from diet She has lost >3 lbs since last OV on 02/05/18 and >15 lbs in last month due to TLC-GREAT! She continues to struggle with fatigue, last thyroid panel-WNL, no adjustment to levothyr  Patient Care Team    Relationship Specialty Notifications Start End  Esaw Grandchild, NP PCP - General Family Medicine  01/30/17     Patient Active Problem List   Diagnosis Date Noted  . Chest tightness 02/26/2018  . Low  libido 02/26/2018  . Flu-like symptoms 11/21/2017  . Nausea 11/21/2017  . S/P hysterectomy with oophorectomy 2014- endometriosis 05/28/2017  . Status post colonoscopy with polypectomy 05/28/2017  . H/O mammogram- 7/17- N 05/28/2017  . Epigastric pain- chronic pain 05/28/2017  . Fatigue 05/28/2017  . Healthcare maintenance 01/30/2017  . Toe pain, bilateral 01/30/2017  . Tendonitis, Achilles, left 01/30/2017  . Hypothyroidism 01/30/2017  . Fibromyalgia 01/30/2017     Past Medical History:  Diagnosis Date  . Fatty liver 06/2016  . Fibromyalgia   . Gastric polyp   . Gastritis   . Headache syndrome    hemiparegic  . Hyperlipidemia   . Hypothyroidism   . Liver cyst   . Migraines   . Neuromuscular disorder (HCC)    Fibromyalgia  . Partial tear of Achilles tendon   . Thyroid disease   . Tubular adenoma of colon      Past Surgical History:  Procedure Laterality Date  . CRUCIATE LIGAMENT REPAIR    . CYSTOSCOPY    . DILATION AND CURETTAGE OF UTERUS    . NASAL SEPTUM SURGERY    . PARTIAL HYSTERECTOMY     with laparoscopy- completed hysterectomy (previously had left salpingo-oophorectomy)  . SALPINGOOPHORECTOMY Left   . TUBAL LIGATION       Family History  Problem Relation Age of Onset  . Alcohol abuse Mother   . Hyperlipidemia Mother   . Hypertension Mother   . Alcohol abuse Father   . Hyperlipidemia Father   . Hypertension Father   . Alcohol  abuse Brother   . Diabetes Son   . Lung cancer Maternal Uncle   . Heart attack Paternal Uncle   . Hypothyroidism Paternal Uncle   . Alcohol abuse Maternal Grandmother   . Hyperlipidemia Maternal Grandmother   . Hypertension Maternal Grandmother   . Alcohol abuse Maternal Grandfather   . Cancer Paternal Grandmother        abdominal  . Heart attack Paternal Grandfather   . Alcohol abuse Brother   . Stomach cancer Maternal Uncle   . Heart attack Paternal Uncle   . Hypothyroidism Paternal Uncle   . Heart attack Paternal  Uncle   . Hypothyroidism Paternal Uncle      Social History   Substance and Sexual Activity  Drug Use No     Social History   Substance and Sexual Activity  Alcohol Use No     Social History   Tobacco Use  Smoking Status Former Smoker  . Packs/day: 1.00  . Years: 15.00  . Pack years: 15.00  . Last attempt to quit: 09/04/1998  . Years since quitting: 19.4  Smokeless Tobacco Never Used     Outpatient Encounter Medications as of 02/26/2018  Medication Sig  . Ascorbic Acid (VITAMIN C PO) Take by mouth.  . Cholecalciferol (VITAMIN D3) 2000 units capsule Take 2,000 Units by mouth daily.  . Cyanocobalamin (B-12 PO) Take by mouth.  . gabapentin (NEURONTIN) 100 MG capsule Take 1 capsule (100 mg total) by mouth at bedtime.  Salley Scarlet [facility]  Achilles Tendonitis Cream- Diclofenac 3%, Baclofen 2%, Bupivacaine 1%, Doxepin 5%, Gabapentin 6%, Ibuprofen 3%, Pentoxifylline 3% Apply 1-2 grams to affected area 3-4 times daily Qty. 120 gm 3 refills  . SYNTHROID 100 MCG tablet TAKE 1 TABLET BY MOUTH EVERY DAY BEFORE BREAKFAST  . [DISCONTINUED] gabapentin (NEURONTIN) 100 MG capsule Take 1 capsule (100 mg total) by mouth at bedtime.  Marland Kitchen albuterol (PROVENTIL HFA;VENTOLIN HFA) 108 (90 Base) MCG/ACT inhaler Inhale 2 puffs into the lungs every 6 (six) hours as needed for wheezing or shortness of breath.   No facility-administered encounter medications on file as of 02/26/2018.     Allergies: Shellfish allergy; Azithromycin; and Erythromycin  Body mass index is 35.12 kg/m.  Blood pressure 112/69, pulse 73, height 5\' 6"  (1.676 m), weight 217 lb 9.6 oz (98.7 kg), SpO2 99 %.  Review of Systems  Constitutional: Positive for fatigue. Negative for activity change, appetite change, chills, diaphoresis, fever and unexpected weight change.  HENT: Negative for congestion and postnasal drip.   Eyes: Negative for visual disturbance.  Respiratory: Positive for chest tightness.  Negative for cough, shortness of breath, wheezing and stridor.   Cardiovascular: Negative for chest pain, palpitations and leg swelling.  Gastrointestinal: Negative for abdominal distention, abdominal pain, blood in stool, constipation, diarrhea, nausea and vomiting.  Endocrine: Negative for cold intolerance, heat intolerance, polydipsia, polyphagia and polyuria.  Genitourinary: Negative for difficulty urinating and flank pain.  Musculoskeletal: Positive for arthralgias and myalgias.  Skin: Negative for color change, pallor, rash and wound.  Neurological: Negative for dizziness and headaches.  Hematological: Does not bruise/bleed easily.  Psychiatric/Behavioral: Positive for sleep disturbance. Negative for decreased concentration, dysphoric mood, hallucinations, self-injury and suicidal ideas. The patient is not nervous/anxious and is not hyperactive.        Objective:   Physical Exam  Constitutional: She is oriented to person, place, and time. She appears well-developed and well-nourished. No distress.  HENT:  Head: Normocephalic and atraumatic.  Right Ear: External  ear normal.  Left Ear: External ear normal.  Cardiovascular: Normal rate, regular rhythm, normal heart sounds and intact distal pulses.  No murmur heard. Pulmonary/Chest: Effort normal. No stridor. No respiratory distress. She has wheezes in the right middle field and the right lower field. She exhibits no tenderness.  Neurological: She is alert and oriented to person, place, and time.  Skin: Skin is warm and dry. Capillary refill takes less than 2 seconds. No rash noted. She is not diaphoretic. No erythema. No pallor.  Psychiatric: She has a normal mood and affect. Her behavior is normal. Judgment and thought content normal.  Nursing note and vitals reviewed.     Assessment & Plan:   1. Healthcare maintenance   2. Chest tightness   3. Fibromyalgia   4. Low libido     Fibromyalgia Continue nightly Gabapentin  100mg  Continue with nightly Gabapentin 100mg . FDA has not endorsed use of CBD oil, however you can do your own research- local [employer]. .  Chest tightness Wheezing noted RML and RLL Albuterol PRN   Low libido Low libido since hysterectomy Referral to OB/GYN placed    FOLLOW-UP:  Return in about 6 months (around 08/28/2018) for Regular Follow Up.

## 2018-02-26 NOTE — Assessment & Plan Note (Signed)
Low libido since hysterectomy Referral to OB/GYN placed

## 2018-02-26 NOTE — Assessment & Plan Note (Signed)
Continue nightly Gabapentin 100mg  Continue with nightly Gabapentin 100mg . FDA has not endorsed use of CBD oil, however you can do your own research- local [employer]. Marland Kitchen

## 2018-03-18 ENCOUNTER — Encounter: Payer: BLUE CROSS/BLUE SHIELD | Admitting: Adult Health

## 2018-03-19 ENCOUNTER — Encounter: Payer: Self-pay | Admitting: Adult Health

## 2018-03-19 ENCOUNTER — Ambulatory Visit (INDEPENDENT_AMBULATORY_CARE_PROVIDER_SITE_OTHER): Payer: BLUE CROSS/BLUE SHIELD | Admitting: Adult Health

## 2018-03-19 VITALS — BP 125/82 | HR 59 | Ht 66.0 in | Wt 212.5 lb

## 2018-03-19 DIAGNOSIS — M797 Fibromyalgia: Secondary | ICD-10-CM | POA: Diagnosis not present

## 2018-03-19 DIAGNOSIS — Z1239 Encounter for other screening for malignant neoplasm of breast: Secondary | ICD-10-CM

## 2018-03-19 DIAGNOSIS — R5383 Other fatigue: Secondary | ICD-10-CM

## 2018-03-19 DIAGNOSIS — Z1231 Encounter for screening mammogram for malignant neoplasm of breast: Secondary | ICD-10-CM | POA: Diagnosis not present

## 2018-03-19 DIAGNOSIS — E039 Hypothyroidism, unspecified: Secondary | ICD-10-CM | POA: Diagnosis not present

## 2018-03-19 DIAGNOSIS — Z0000 Encounter for general adult medical examination without abnormal findings: Secondary | ICD-10-CM

## 2018-03-19 DIAGNOSIS — E7800 Pure hypercholesterolemia, unspecified: Secondary | ICD-10-CM

## 2018-03-19 NOTE — Assessment & Plan Note (Signed)
Decreased nighttime pain with gabapentin 100mg  QHS

## 2018-03-19 NOTE — Assessment & Plan Note (Signed)
02/2018 Thyroid Panel-WNL Continue on Levothyroxine 170mcg QD

## 2018-03-19 NOTE — Assessment & Plan Note (Signed)
Stable Currenlty on Levothyroxine 100mg  QD Last thyroid panel was WNL

## 2018-03-19 NOTE — Assessment & Plan Note (Signed)
Increase water intake, strive for at least 100 ounces/day.   Follow Mediterranean diet. Continue regular exercise.  Recommend at least 30 minutes daily, 5 days per week of walking, jogging, biking, swimming, YouTube/Pinterest workout videos. Please continue all medications directed. Bilateral breast ultrasound ordered due to significant pain during mammogram. Recommend follow-up in 6 months.

## 2018-03-19 NOTE — Progress Notes (Signed)
Subjective:    Patient ID: Carol Jenkins, female    DOB: September 30, 1965, 52 y.o.   MRN: [id number]  HPI:  Carol Jenkins is here for CPE She reports medication compliance, denies SE She was seen last month for increase in fatigue and fibromyalgia. She continues to increase regular exercise (walking and stationary bike) and has been following a combination of Weight Watchers and Paleo diet. She has lost another 5 lbs since last OV 02/26/18 She has been using OTC CBD oil nightly to treat insomnia and reports much improved  Reviewed most recent labs She needs fasting labs, she will return for lab draw int he next few days  Healthcare Maintenance: PAP-hx of hysterectomy Mammogram- pt vehemently requests bil breast US instead of mammogram due to sig pain with imaging procedure r/t fibromyalgia Colonoscopy- UTD, last completed 01/2016 Immunizations-UTD   Patient Care Team    Relationship Specialty Notifications Start End  Esaw Grandchild, NP PCP - General Family Medicine  01/30/17     Patient Active Problem List   Diagnosis Date Noted  . Chest tightness 02/26/2018  . Low libido 02/26/2018  . Flu-like symptoms 11/21/2017  . Nausea 11/21/2017  . S/P hysterectomy with oophorectomy 2014- endometriosis 05/28/2017  . Status post colonoscopy with polypectomy 05/28/2017  . H/O mammogram- 7/17- N 05/28/2017  . Epigastric pain- chronic pain 05/28/2017  . Fatigue 05/28/2017  . Healthcare maintenance 01/30/2017  . Toe pain, bilateral 01/30/2017  . Tendonitis, Achilles, left 01/30/2017  . Hypothyroidism 01/30/2017  . Fibromyalgia 01/30/2017     Past Medical History:  Diagnosis Date  . Fatty liver 06/2016  . Fibromyalgia   . Gastric polyp   . Gastritis   . Headache syndrome    hemiparegic  . Hyperlipidemia   . Hypothyroidism   . Liver cyst   . Migraines   . Neuromuscular disorder (HCC)    Fibromyalgia  . Partial tear of Achilles tendon   . Thyroid disease   . Tubular adenoma of colon       Past Surgical History:  Procedure Laterality Date  . CRUCIATE LIGAMENT REPAIR    . CYSTOSCOPY    . DILATION AND CURETTAGE OF UTERUS    . NASAL SEPTUM SURGERY    . PARTIAL HYSTERECTOMY     with laparoscopy- completed hysterectomy (previously had left salpingo-oophorectomy)  . SALPINGOOPHORECTOMY Left   . TUBAL LIGATION       Family History  Problem Relation Age of Onset  . Alcohol abuse Mother   . Hyperlipidemia Mother   . Hypertension Mother   . Alcohol abuse Father   . Hyperlipidemia Father   . Hypertension Father   . Alcohol abuse Brother   . Diabetes Son   . Lung cancer Maternal Uncle   . Heart attack Paternal Uncle   . Hypothyroidism Paternal Uncle   . Alcohol abuse Maternal Grandmother   . Hyperlipidemia Maternal Grandmother   . Hypertension Maternal Grandmother   . Alcohol abuse Maternal Grandfather   . Cancer Paternal Grandmother        abdominal  . Heart attack Paternal Grandfather   . Alcohol abuse Brother   . Stomach cancer Maternal Uncle   . Heart attack Paternal Uncle   . Hypothyroidism Paternal Uncle   . Heart attack Paternal Uncle   . Hypothyroidism Paternal Uncle      Social History   Substance and Sexual Activity  Drug Use No     Social History   Substance and Sexual Activity  Alcohol Use No     Social History   Tobacco Use  Smoking Status Former Smoker  . Packs/day: 1.00  . Years: 15.00  . Pack years: 15.00  . Last attempt to quit: 09/04/1998  . Years since quitting: 19.5  Smokeless Tobacco Never Used     Outpatient Encounter Medications as of 03/19/2018  Medication Sig  . albuterol (PROVENTIL HFA;VENTOLIN HFA) 108 (90 Base) MCG/ACT inhaler Inhale 2 puffs into the lungs every 6 (six) hours as needed for wheezing or shortness of breath.  . Ascorbic Acid (VITAMIN C PO) Take by mouth.  . Cholecalciferol (VITAMIN D3) 2000 units capsule Take 4,000 Units by mouth daily.  . Cyanocobalamin (B-12 PO) Take by mouth.  . gabapentin  (NEURONTIN) 100 MG capsule Take 1 capsule (100 mg total) by mouth at bedtime.  . NON FORMULARY CBD Oil  . SYNTHROID 100 MCG tablet TAKE 1 TABLET BY MOUTH EVERY DAY BEFORE BREAKFAST  . [DISCONTINUED] NON FORMULARY [facility]  Achilles Tendonitis Cream- Diclofenac 3%, Baclofen 2%, Bupivacaine 1%, Doxepin 5%, Gabapentin 6%, Ibuprofen 3%, Pentoxifylline 3% Apply 1-2 grams to affected area 3-4 times daily Qty. 120 gm 3 refills   No facility-administered encounter medications on file as of 03/19/2018.     Allergies: Shellfish allergy; Azithromycin; and Erythromycin  Body mass index is 34.3 kg/m.  Blood pressure 125/82, pulse (!) 59, height 5\' 6"  (1.676 m), weight 212 lb 8 oz (96.4 kg), SpO2 98 %.  Review of Systems  Constitutional: Positive for fatigue. Negative for activity change, appetite change, chills, diaphoresis, fever and unexpected weight change.  Eyes: Negative for visual disturbance.  Respiratory: Negative for cough, chest tightness, shortness of breath, wheezing and stridor.   Cardiovascular: Negative for chest pain, palpitations and leg swelling.  Gastrointestinal: Negative for abdominal distention, abdominal pain, blood in stool, constipation, diarrhea, nausea and vomiting.  Endocrine: Negative for cold intolerance, heat intolerance, polydipsia, polyphagia and polyuria.  Genitourinary: Negative for difficulty urinating, enuresis and flank pain.  Musculoskeletal: Positive for arthralgias and myalgias. Negative for back pain, gait problem, joint swelling, neck pain and neck stiffness.  Skin: Negative for color change, pallor, rash and wound.  Neurological: Negative for dizziness and headaches.  Hematological: Does not bruise/bleed easily.  Psychiatric/Behavioral: Negative for confusion, decreased concentration, dysphoric mood, hallucinations, self-injury, sleep disturbance and suicidal ideas. The patient is not nervous/anxious and is not hyperactive.        Objective:    Physical Exam  Constitutional: She is oriented to person, place, and time. She appears well-developed and well-nourished. No distress.  HENT:  Head: Normocephalic and atraumatic.  Right Ear: External ear normal. Tympanic membrane is not perforated and not bulging. No decreased hearing is noted.  Left Ear: External ear normal. Tympanic membrane is not perforated and not bulging. No decreased hearing is noted.  Nose: Nose normal. No mucosal edema. Right sinus exhibits no maxillary sinus tenderness and no frontal sinus tenderness. Left sinus exhibits no maxillary sinus tenderness and no frontal sinus tenderness.  Mouth/Throat: Uvula is midline, oropharynx is clear and moist and mucous membranes are normal.  Eyes: Pupils are equal, round, and reactive to light. Conjunctivae and EOM are normal.  Neck: Normal range of motion. Neck supple.  Cardiovascular: Normal rate, regular rhythm, normal heart sounds and intact distal pulses.  No murmur heard. Pulmonary/Chest: Effort normal and breath sounds normal. No stridor. No respiratory distress. She has no decreased breath sounds. She has no wheezes. She has no rhonchi. She has no rales.  She exhibits no mass and no tenderness. Right breast exhibits no inverted nipple, no mass, no nipple discharge, no skin change and no tenderness. Left breast exhibits no inverted nipple, no mass, no nipple discharge, no skin change and no tenderness.  Abdominal: Bowel sounds are normal. She exhibits no distension and no mass. There is no tenderness. There is no rebound and no guarding. No hernia.  Musculoskeletal: Normal range of motion. She exhibits edema and tenderness.  Lymphadenopathy:    She has no cervical adenopathy.  Neurological: She is alert and oriented to person, place, and time. Coordination normal.  Skin: Skin is warm and dry. Capillary refill takes less than 2 seconds. No rash noted. She is not diaphoretic. No erythema. No pallor.  Psychiatric: She has a  normal mood and affect. Her behavior is normal. Judgment and thought content normal.      Assessment & Plan:   1. Healthcare maintenance   2. Hypothyroidism, unspecified type   3. Elevated LDL cholesterol level     Hypothyroidism 02/2018 Thyroid Panel-WNL Continue on Levothyroxine 156mcg QD  Healthcare maintenance Increase water intake, strive for at least 100 ounces/day.   Follow Mediterranean diet. Continue regular exercise.  Recommend at least 30 minutes daily, 5 days per week of walking, jogging, biking, swimming, YouTube/Pinterest workout videos. Please continue all medications directed. Bilateral breast ultrasound ordered due to significant pain during mammogram. Recommend follow-up in 6 months.    FOLLOW-UP:  Return in about 6 months (around 09/19/2018) for Regular Follow Up.

## 2018-03-19 NOTE — Patient Instructions (Signed)
Mediterranean Diet A Mediterranean diet refers to food and lifestyle choices that are based on the traditions of countries located on the [place]. This way of eating has been shown to help prevent certain conditions and improve outcomes for people who have chronic diseases, like kidney disease and heart disease. What are tips for following this plan? Lifestyle  Cook and eat meals together with your family, when possible.  Drink enough fluid to keep your urine clear or pale yellow.  Be physically active every day. This includes: ? Aerobic exercise like running or swimming. ? Leisure activities like gardening, walking, or housework.  Get 7-8 hours of sleep each night.  If recommended by your health care provider, drink red wine in moderation. This means 1 glass a day for nonpregnant women and 2 glasses a day for men. A glass of wine equals 5 oz (150 mL). Reading food labels  Check the serving size of packaged foods. For foods such as rice and pasta, the serving size refers to the amount of cooked product, not dry.  Check the total fat in packaged foods. Avoid foods that have saturated fat or trans fats.  Check the ingredients list for added sugars, such as corn syrup. Shopping  At the grocery store, buy most of your food from the areas near the walls of the store. This includes: ? Fresh fruits and vegetables (produce). ? Grains, beans, nuts, and seeds. Some of these may be available in unpackaged forms or large amounts (in bulk). ? Fresh seafood. ? Poultry and eggs. ? Low-fat dairy products.  Buy whole ingredients instead of prepackaged foods.  Buy fresh fruits and vegetables in-season from local farmers markets.  Buy frozen fruits and vegetables in resealable bags.  If you do not have access to quality fresh seafood, buy precooked frozen shrimp or canned fish, such as tuna, salmon, or sardines.  Buy small amounts of raw or cooked vegetables, salads, or olives from the  deli or salad bar at your store.  Stock your pantry so you always have certain foods on hand, such as olive oil, canned tuna, canned tomatoes, rice, pasta, and beans. Cooking  Cook foods with extra-virgin olive oil instead of using butter or other vegetable oils.  Have meat as a side dish, and have vegetables or grains as your main dish. This means having meat in small portions or adding small amounts of meat to foods like pasta or stew.  Use beans or vegetables instead of meat in common dishes like chili or lasagna.  Experiment with different cooking methods. Try roasting or broiling vegetables instead of steaming or sauteing them.  Add frozen vegetables to soups, stews, pasta, or rice.  Add nuts or seeds for added healthy fat at each meal. You can add these to yogurt, salads, or vegetable dishes.  Marinate fish or vegetables using olive oil, lemon juice, garlic, and fresh herbs. Meal planning  Plan to eat 1 vegetarian meal one day each week. Try to work up to 2 vegetarian meals, if possible.  Eat seafood 2 or more times a week.  Have healthy snacks readily available, such as: ? Vegetable sticks with hummus. ? Greek yogurt. ? Fruit and nut trail mix.  Eat balanced meals throughout the week. This includes: ? Fruit: 2-3 servings a day ? Vegetables: 4-5 servings a day ? Low-fat dairy: 2 servings a day ? Fish, poultry, or lean meat: 1 serving a day ? Beans and legumes: 2 or more servings a week ? Nuts   and seeds: 1-2 servings a day ? Whole grains: 6-8 servings a day ? Extra-virgin olive oil: 3-4 servings a day  Limit red meat and sweets to only a few servings a month What are my food choices?  Mediterranean diet ? Recommended ? Grains: Whole-grain pasta. Brown rice. Bulgar wheat. Polenta. Couscous. Whole-wheat bread. [place]. ? Vegetables: Artichokes. Beets. Broccoli. Cabbage. Carrots. Eggplant. Green beans. Chard. Kale. Spinach. Onions. Leeks. Peas. Squash.  Tomatoes. Peppers. Radishes. ? Fruits: Apples. Apricots. Avocado. Berries. Bananas. Cherries. Dates. Figs. Grapes. Lemons. Melon. Oranges. Peaches. Plums. Pomegranate. ? Meats and other protein foods: Beans. Almonds. Sunflower seeds. Pine nuts. Peanuts. [place]. Salmon. Scallops. Shrimp. [place]. Tilapia. Clams. Oysters. Eggs. ? Dairy: Low-fat milk. Cheese. Greek yogurt. ? Beverages: Water. Red wine. Herbal tea. ? Fats and oils: Extra virgin olive oil. Avocado oil. Grape seed oil. ? Sweets and desserts: [place] yogurt with honey. Baked apples. Poached pears. Trail mix. ? Seasoning and other foods: Basil. Cilantro. Coriander. Cumin. Mint. Parsley. Sage. Rosemary. Tarragon. Garlic. Oregano. Thyme. Pepper. Balsalmic vinegar. Tahini. Hummus. Tomato sauce. Olives. Mushrooms. ? Limit these ? Grains: Prepackaged pasta or rice dishes. Prepackaged cereal with added sugar. ? Vegetables: Deep fried potatoes (french fries). ? Fruits: Fruit canned in syrup. ? Meats and other protein foods: Beef. Pork. Lamb. Poultry with skin. Hot dogs. Berniece Salines. ? Dairy: Ice cream. Sour cream. Whole milk. ? Beverages: Juice. Sugar-sweetened soft drinks. Beer. Liquor and spirits. ? Fats and oils: Butter. Canola oil. Vegetable oil. Beef fat (tallow). Lard. ? Sweets and desserts: Cookies. Cakes. Pies. Candy. ? Seasoning and other foods: Mayonnaise. Premade sauces and marinades. ? The items listed may not be a complete list. Talk with your dietitian about what dietary choices are right for you. Summary  The Mediterranean diet includes both food and lifestyle choices.  Eat a variety of fresh fruits and vegetables, beans, nuts, seeds, and whole grains.  Limit the amount of red meat and sweets that you eat.  Talk with your health care provider about whether it is safe for you to drink red wine in moderation. This means 1 glass a day for nonpregnant women and 2 glasses a day for men. A glass of wine equals 5 oz (150 mL). This information  is not intended to replace advice given to you by your health care provider. Make sure you discuss any questions you have with your health care provider. Document Released: 04/13/2016 Document Revised: 05/16/2016 Document Reviewed: 04/13/2016 Elsevier Interactive Patient Education  2018 [employer].   Exercising to [employer] Exercising can help you to lose weight. In order to lose weight through exercise, you need to do vigorous-intensity exercise. You can tell that you are exercising with vigorous intensity if you are breathing very hard and fast and cannot hold a conversation while exercising. Moderate-intensity exercise helps to maintain your current weight. You can tell that you are exercising at a moderate level if you have a higher heart rate and faster breathing, but you are still able to hold a conversation. How often should I exercise? Choose an activity that you enjoy and set realistic goals. Your health care provider can help you to make an activity plan that works for you. Exercise regularly as directed by your health care provider. This may include:  Doing resistance training twice each week, such as: ? Push-ups. ? Sit-ups. ? Lifting weights. ? Using resistance bands.  Doing a given intensity of exercise for a given amount of time. Choose from these options: ? 150  minutes of moderate-intensity exercise every week. ? 75 minutes of vigorous-intensity exercise every week. ? A mix of moderate-intensity and vigorous-intensity exercise every week.  Children, pregnant women, people who are out of shape, people who are overweight, and older adults may need to consult a health care provider for individual recommendations. If you have any sort of medical condition, be sure to consult your health care provider before starting a new exercise program. What are some activities that can help me to lose weight?  Walking at a rate of at least 4.5 miles an hour.  Jogging or running at a  rate of 5 miles per hour.  Biking at a rate of at least 10 miles per hour.  Lap swimming.  Roller-skating or in-line skating.  Cross-country skiing.  Vigorous competitive sports, such as football, basketball, and soccer.  Jumping rope.  Aerobic dancing. How can I be more active in my day-to-day activities?  Use the stairs instead of the elevator.  Take a walk during your lunch break.  If you drive, park your car farther away from work or school.  If you take public transportation, get off one stop early and walk the rest of the way.  Make all of your phone calls while standing up and walking around.  Get up, stretch, and walk around every 30 minutes throughout the day. What guidelines should I follow while exercising?  Do not exercise so much that you hurt yourself, feel dizzy, or get very short of breath.  Consult your health care provider prior to starting a new exercise program.  Wear comfortable clothes and shoes with good support.  Drink plenty of water while you exercise to prevent dehydration or heat stroke. Body water is lost during exercise and must be replaced.  Work out until you breathe faster and your heart beats faster. This information is not intended to replace advice given to you by your health care provider. Make sure you discuss any questions you have with your health care provider. Document Released: 09/23/2010 Document Revised: 01/27/2016 Document Reviewed: 01/22/2014 Elsevier Interactive Patient Education  2018 [employer].   Increase water intake, strive for at least 100 ounces/day.   Follow Mediterranean diet. Continue regular exercise.  Recommend at least 30 minutes daily, 5 days per week of walking, jogging, biking, swimming, YouTube/Pinterest workout videos. Please continue all medications directed. Bilateral breast ultrasound ordered due to significant pain during mammogram. Recommend follow-up in 6 months. GREAT JOB ON YOUR OVERALL  HEALTH! NICE TO SEE YOU!

## 2018-03-20 ENCOUNTER — Encounter: Payer: Self-pay | Admitting: Adult Health

## 2018-04-12 ENCOUNTER — Ambulatory Visit: Payer: BLUE CROSS/BLUE SHIELD

## 2018-04-12 ENCOUNTER — Other Ambulatory Visit: Payer: Self-pay

## 2018-04-12 ENCOUNTER — Encounter: Payer: Self-pay | Admitting: Obstetrics and Gynecology

## 2018-04-12 ENCOUNTER — Ambulatory Visit
Admission: RE | Admit: 2018-04-12 | Discharge: 2018-04-12 | Disposition: A | Discharge status: Home / Self Care | Payer: BLUE CROSS/BLUE SHIELD | Source: Ambulatory Visit | Attending: Adult Health | Admitting: Adult Health

## 2018-04-12 ENCOUNTER — Ambulatory Visit: Payer: BLUE CROSS/BLUE SHIELD | Admitting: Obstetrics and Gynecology

## 2018-04-12 VITALS — BP 100/68 | HR 63 | Ht 65.5 in | Wt 206.0 lb

## 2018-04-12 DIAGNOSIS — Z01419 Encounter for gynecological examination (general) (routine) without abnormal findings: Secondary | ICD-10-CM | POA: Diagnosis not present

## 2018-04-12 DIAGNOSIS — R6882 Decreased libido: Secondary | ICD-10-CM

## 2018-04-12 DIAGNOSIS — N951 Menopausal and female climacteric states: Secondary | ICD-10-CM

## 2018-04-12 DIAGNOSIS — Z1239 Encounter for other screening for malignant neoplasm of breast: Secondary | ICD-10-CM

## 2018-04-12 DIAGNOSIS — Z1231 Encounter for screening mammogram for malignant neoplasm of breast: Secondary | ICD-10-CM | POA: Diagnosis not present

## 2018-04-12 NOTE — Progress Notes (Signed)
52 y.o. [id number] Married Caucasian female here for annual exam.    Told by her PCP she needs a well woman visit.  Still has her right ovary.  Decreased libido since her hysterectomy in 2014.  Status post laparoscopic hysterectomy and LSO for abnormal uterine bleeding and pain.  [facility] in [place].  States benign reports from pathology. Not sexually active for one year. Not enjoying sex.   Has pain with intercourse since hysterectomy.  This occurs some of the time.  Position changes can help.  No lubricant needed in past. Husband also has issues with pain with intercourse.  Had hot flashes after hysterectomy and then stopped for several years. Now over last several weeks is having hot flashes.   States possible dx of fibromyalgia.  Feels her body pain is related to her hormonal fluctuations.  States she feels happy overall, but does deal with pain.   Has had counseling in the past with counselors.  States she prefers not to use medications.   Lost 25 pounds intentionally.   Lost her oldest son at age 19 from [place].  Expecting first grandchild soon.  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   PCP:   Mina Marble, NP [facility]  No LMP recorded. Patient has had a hysterectomy.     Period Cycle (Days): 28 Period Duration (Days): 4 Period Pattern: Regular Menstrual Flow: Heavy Menstrual Control: Maxi pad, Tampon Menstrual Control Change Freq (Hours): every hour  Dysmenorrhea: (!) Severe Dysmenorrhea Symptoms: Cramping     Sexually active: No.  The current method of family planning is status post hysterectomy.    Exercising: Yes.    walking Smoker:  no  Health Maintenance: Pap:  2014 per patient History of abnormal Pap:  no MMG:  2017 per patient Colonoscopy:  2017 normal polyps BMD:   none  Result  n/a TDaP:  Unknown Gardasil:   no HIV:  In pregnancy. Hep C:  Neg per patient. Screening Labs: Done with PCP   reports that she quit smoking about  19 years ago. She has a 15.00 pack-year smoking history. She has never used smokeless tobacco. She reports that she does not drink alcohol or use drugs.  Past Medical History:  Diagnosis Date  . Fatty liver 06/2016  . Fibromyalgia   . Gastric polyp   . Gastritis   . Headache syndrome    hemiparegic  . Hyperlipidemia   . Hypothyroidism   . Liver cyst   . Migraines   . Neuromuscular disorder (HCC)    Fibromyalgia  . Partial tear of Achilles tendon   . Thyroid disease   . Tubular adenoma of colon     Past Surgical History:  Procedure Laterality Date  . ABDOMINAL HYSTERECTOMY    . CRUCIATE LIGAMENT REPAIR    . CYSTOSCOPY    . DILATION AND CURETTAGE OF UTERUS    . NASAL SEPTUM SURGERY    . PARTIAL HYSTERECTOMY     with laparoscopy- completed hysterectomy (previously had left salpingo-oophorectomy)  . SALPINGOOPHORECTOMY Left   . TUBAL LIGATION      Current Outpatient Medications  Medication Sig Dispense Refill  . Ascorbic Acid (VITAMIN C PO) Take by mouth.    . Cholecalciferol (VITAMIN D3) 2000 units capsule Take 4,000 Units by mouth daily.    . Cyanocobalamin (B-12 PO) Take by mouth.    . gabapentin (NEURONTIN) 100 MG capsule Take 1 capsule (100 mg total) by mouth at bedtime. 90 capsule 2  .  Magnesium 100 MG TABS     . NON FORMULARY CBD Oil    . SYNTHROID 100 MCG tablet TAKE 1 TABLET BY MOUTH EVERY DAY BEFORE BREAKFAST 90 tablet 0   No current facility-administered medications for this visit.     Family History  Problem Relation Age of Onset  . Alcohol abuse Mother   . Hyperlipidemia Mother   . Hypertension Mother   . Alcohol abuse Father   . Hyperlipidemia Father   . Hypertension Father   . Alcohol abuse Brother   . Diabetes Son   . Lung cancer Maternal Uncle   . Heart attack Paternal Uncle   . Hypothyroidism Paternal Uncle   . Alcohol abuse Maternal Grandmother   . Hyperlipidemia Maternal Grandmother   . Hypertension Maternal Grandmother   . Alcohol abuse  Maternal Grandfather   . Cancer Paternal Grandmother        abdominal  . Heart attack Paternal Grandfather   . Alcohol abuse Brother   . Stomach cancer Maternal Uncle   . Heart attack Paternal Uncle   . Hypothyroidism Paternal Uncle   . Heart attack Paternal Uncle   . Hypothyroidism Paternal Uncle     Review of Systems  Exam:   BP 100/68   Pulse 63   Ht 5' 5.5" (1.664 m)   Wt 206 lb (93.4 kg)   BMI 33.76 kg/m     General appearance: alert, cooperative and appears stated age Head: Normocephalic, without obvious abnormality, atraumatic Neck: no adenopathy, supple, symmetrical, trachea midline and thyroid normal to inspection and palpation Lungs: clear to auscultation bilaterally Breasts: normal appearance, no masses or tenderness, No nipple retraction or dimpling, No nipple discharge or bleeding, No axillary or supraclavicular adenopathy Heart: regular rate and rhythm Abdomen: soft, non-tender; no masses, no organomegaly Extremities: extremities normal, atraumatic, no cyanosis or edema Skin: Skin color, texture, turgor normal. No rashes or lesions Lymph nodes: Cervical, supraclavicular, and axillary nodes normal. No abnormal inguinal nodes palpated Neurologic: Grossly normal  Pelvic: External genitalia:  no lesions              Urethra:  normal appearing urethra with no masses, tenderness or lesions              Bartholins and Skenes: normal                 Vagina: normal appearing vagina with normal color and discharge, no lesions              Cervix: absent              Pap taken: No. Bimanual Exam:  Uterus:   Absent.               Adnexa: no mass, fullness, tenderness              Rectal exam: Yes.  .  Confirms.              Anus:  normal sphincter tone, no lesions  Chaperone was present for exam.  Assessment:   Well woman visit with normal exam. Status post laparoscopic hysterectomy.  Status post LSO.  Decreased libido.  Hemiplegic migraine.  Hypothyroidism.   Menopausal symptoms.  On Neurontin, starting recently.  Hyperlipidemia.  Bereavement.   Plan: Mammogram screening. Recommended self breast awareness. Pap and HR HPV as above. Guidelines for Calcium, Vitamin D, regular exercise program including cardiovascular and weight bearing exercise. We discussed decreased libido.  Consider Avlimil for decreased libido. Referral  to [facility]. Will not do testosterone measurement.  Will not plan for testosterone therapy given her hemiplegic migraines.  Follow up annually and prn.   Additional counseling given.  Yes.  . ____20___ minutes face to face time of which over 50% was spent in counseling regarding decreased libido, menopausal symptoms.    After visit summary provided.

## 2018-04-12 NOTE — Patient Instructions (Addendum)

## 2018-04-16 ENCOUNTER — Ambulatory Visit: Payer: BLUE CROSS/BLUE SHIELD | Admitting: Podiatry

## 2018-04-16 ENCOUNTER — Ambulatory Visit (INDEPENDENT_AMBULATORY_CARE_PROVIDER_SITE_OTHER): Payer: BLUE CROSS/BLUE SHIELD

## 2018-04-16 DIAGNOSIS — M7660 Achilles tendinitis, unspecified leg: Secondary | ICD-10-CM

## 2018-04-16 DIAGNOSIS — M722 Plantar fascial fibromatosis: Secondary | ICD-10-CM

## 2018-04-16 DIAGNOSIS — M7661 Achilles tendinitis, right leg: Secondary | ICD-10-CM

## 2018-04-16 DIAGNOSIS — M779 Enthesopathy, unspecified: Secondary | ICD-10-CM

## 2018-04-16 MED ORDER — TRIAMCINOLONE ACETONIDE 10 MG/ML IJ SUSP
10.0000 mg | Freq: Once | INTRAMUSCULAR | Status: AC
  Administered 2018-04-16: 10 mg

## 2018-04-19 ENCOUNTER — Other Ambulatory Visit (INDEPENDENT_AMBULATORY_CARE_PROVIDER_SITE_OTHER): Payer: BLUE CROSS/BLUE SHIELD

## 2018-04-19 DIAGNOSIS — Z0000 Encounter for general adult medical examination without abnormal findings: Secondary | ICD-10-CM | POA: Diagnosis not present

## 2018-04-19 DIAGNOSIS — E7800 Pure hypercholesterolemia, unspecified: Secondary | ICD-10-CM

## 2018-04-19 NOTE — Progress Notes (Signed)
Subjective: 52 year old female presents the office today for concerns of pain in the ball of her right foot is also getting pain along the outside heel going up the leg.  She states that the Achilles tendon on the left side is done well about 90% better.  She started getting pain to the right side of the last couple of months.  She denies any recent injury or trauma she denies any swelling or redness or numbness or tingling.  She has no other concerns today. Denies any systemic complaints such as fevers, chills, nausea, vomiting. No acute changes since last appointment, and no other complaints at this time.   Objective: AAO x3, NAD DP/PT pulses palpable bilaterally, CRT less than 3 seconds There is tenderness palpation on the second and third interspaces of the right foot there is no area pinpoint tenderness on the metatarsals.  There does appear to be some palpable Louis, bursa most of the third interspace mostly to the second interspace.  There is no neuroma palpable and there is no numbness or tingling.  There is tenderness along the lateral aspect of the heel posteriorly inferiorly on the distal portion of the Achilles tendon on the plantar fascia laterally.  There is no pain with lateral compression of the calcaneus.  Overall the plantar fashion Achilles tendon appear to be intact.  No other areas of tenderness. No open lesions or pre-ulcerative lesions.  No pain with calf compression, swelling, warmth, erythema  Assessment: Capsulitis, tendinitis right foot  Plan: -All treatment options discussed with the patient including all alternatives, risks, complications.  -X-rays were obtained reviewed.  There is no evidence of acute fracture or stress fracture identified today. -Steroid injection performed.  See procedure note below. -Discussed stretching, icing exercises daily.  Discussed shoe modifications and orthotics.  She was going proceed with orthotics after discussed this.  She was seen today  by Liliane Channel for molding. -Patient encouraged to call the office with any questions, concerns, change in symptoms.   Procedure: Injection 2nd interspace Discussed alternatives, risks, complications and verbal consent was obtained.  Location: Right interspace, dorsal approach Skin Prep: Alcohol. Injectate: 0.5cc 0.5% marcaine plain, 0.5 cc 2% lidocaine plain and, 1 cc kenalog 10. Disposition: Patient tolerated procedure well. Injection site dressed with a band-aid.  Post-injection care was discussed and return precautions discussed.   Trula Slade DPM

## 2018-04-20 LAB — LIPID PANEL
CHOLESTEROL TOTAL: 177 mg/dL (ref 100–199)
Chol/HDL Ratio: 3.2 ratio (ref 0.0–4.4)
HDL: 55 mg/dL (ref >39)
LDL Calculated: 111 mg/dL — ABNORMAL HIGH (ref 0–99)
TRIGLYCERIDES: 54 mg/dL (ref 0–149)
VLDL CHOLESTEROL CAL: 11 mg/dL (ref 5–40)

## 2018-05-03 ENCOUNTER — Other Ambulatory Visit: Payer: Self-pay | Admitting: Adult Health

## 2018-05-09 ENCOUNTER — Encounter: Payer: BLUE CROSS/BLUE SHIELD | Admitting: Orthotics

## 2018-05-15 ENCOUNTER — Ambulatory Visit: Payer: BLUE CROSS/BLUE SHIELD | Admitting: Orthotics

## 2018-05-15 DIAGNOSIS — M7662 Achilles tendinitis, left leg: Secondary | ICD-10-CM

## 2018-05-15 DIAGNOSIS — M722 Plantar fascial fibromatosis: Secondary | ICD-10-CM

## 2018-05-15 DIAGNOSIS — M7660 Achilles tendinitis, unspecified leg: Secondary | ICD-10-CM

## 2018-05-15 NOTE — Progress Notes (Signed)
Patient came in today to pick up custom made foot orthotics.  The goals were accomplished and the patient reported no dissatisfaction with said orthotics.  Patient was advised of breakin period and how to report any issues. 

## 2018-08-21 NOTE — Progress Notes (Signed)
Subjective:    Patient ID: Carol Jenkins, female    DOB: 1966-08-05, 52 y.o.   MRN: [id number]  HPI:  03/19/18 OV: Carol Jenkins is here for CPE She reports medication compliance, denies SE She was seen last month for increase in fatigue and fibromyalgia. She continues to increase regular exercise (walking and stationary bike) and has been following a combination of Weight Watchers and Paleo diet. She has lost another 5 lbs since last OV 02/26/18 She has been using OTC CBD oil nightly to treat insomnia and reports much improved  Reviewed most recent labs She needs fasting labs, she will return for lab draw int he next few days  08/22/18 OV: Carol Jenkins is here for regular f/u: She continues to walk or ride her bike >40 mins 4 times/week She continues to drink >68 oz/day and follows diet high in fruits/vegetebles/proteins, low CHO She has lost >40 lbs in 10 months She reports "feeling better than I have in years!" She reports scant hematochezia 3 times over the last 4 weeks She reports mild, brief, intermittent upper adb pain that has been occurring for >2 years and she believes "it is r/t my liver cysts" She has stopped using Gabapentin due to sedation issues  The 10-year ASCVD risk score Mikey Bussing DC Brooke Bonito., et al., 2013) is: 1.5%   Values used to calculate the score:     Age: 52 years     Sex: Female     Is Non-Hispanic African American: No     Diabetic: No     Tobacco smoker: No     Systolic Blood Pressure: 409 mmHg     Is BP treated: No     HDL Cholesterol: 55 mg/dL     Total Cholesterol: 177 mg/dL  LDL-111  Patient Care Team    Relationship Specialty Notifications Start End  Esaw Grandchild, NP PCP - General Family Medicine  01/30/17     Patient Active Problem List   Diagnosis Date Noted  . Blood in stool 08/22/2018  . Elevated LDL cholesterol level 08/22/2018  . Chest tightness 02/26/2018  . Low libido 02/26/2018  . Nausea 11/21/2017  . S/P hysterectomy with oophorectomy  2014- endometriosis 05/28/2017  . Status post colonoscopy with polypectomy 05/28/2017  . H/O mammogram- 7/17- N 05/28/2017  . Epigastric pain- chronic pain 05/28/2017  . Fatigue 05/28/2017  . Healthcare maintenance 01/30/2017  . Toe pain, bilateral 01/30/2017  . Tendonitis, Achilles, left 01/30/2017  . Hypothyroidism 01/30/2017  . Fibromyalgia 01/30/2017     Past Medical History:  Diagnosis Date  . Fatty liver 06/2016  . Fibromyalgia   . Gastric polyp   . Gastritis   . Headache syndrome    hemiparegic  . Hyperlipidemia   . Hypothyroidism   . Liver cyst   . Migraines   . Neuromuscular disorder (HCC)    Fibromyalgia  . Partial tear of Achilles tendon   . Thyroid disease   . Tubular adenoma of colon      Past Surgical History:  Procedure Laterality Date  . ABDOMINAL HYSTERECTOMY    . CRUCIATE LIGAMENT REPAIR    . CYSTOSCOPY    . DILATION AND CURETTAGE OF UTERUS    . NASAL SEPTUM SURGERY    . PARTIAL HYSTERECTOMY     with laparoscopy- completed hysterectomy (previously had left salpingo-oophorectomy)  . SALPINGOOPHORECTOMY Left   . TUBAL LIGATION       Family History  Problem Relation Age of Onset  . Alcohol abuse  Mother   . Hyperlipidemia Mother   . Hypertension Mother   . Alcohol abuse Father   . Hyperlipidemia Father   . Hypertension Father   . Alcohol abuse Brother   . Diabetes Son   . Lung cancer Maternal Uncle   . Heart attack Paternal Uncle   . Hypothyroidism Paternal Uncle   . Alcohol abuse Maternal Grandmother   . Hyperlipidemia Maternal Grandmother   . Hypertension Maternal Grandmother   . Alcohol abuse Maternal Grandfather   . Cancer Paternal Grandmother        abdominal  . Heart attack Paternal Grandfather   . Alcohol abuse Brother   . Stomach cancer Maternal Uncle   . Heart attack Paternal Uncle   . Hypothyroidism Paternal Uncle   . Heart attack Paternal Uncle   . Hypothyroidism Paternal Uncle      Social History   Substance and  Sexual Activity  Drug Use No     Social History   Substance and Sexual Activity  Alcohol Use No     Social History   Tobacco Use  Smoking Status Former Smoker  . Packs/day: 1.00  . Years: 15.00  . Pack years: 15.00  . Last attempt to quit: 09/04/1998  . Years since quitting: 19.9  Smokeless Tobacco Never Used     Outpatient Encounter Medications as of 08/22/2018  Medication Sig  . Ascorbic Acid (VITAMIN C PO) Take by mouth.  . Cholecalciferol (VITAMIN D3) 2000 units capsule Take 4,000 Units by mouth daily.  . Cyanocobalamin (B-12 PO) Take by mouth.  . Magnesium 100 MG TABS   . SYNTHROID 100 MCG tablet TAKE 1 TABLET BY MOUTH EVERY DAY BEFORE BREAKFAST  . [DISCONTINUED] gabapentin (NEURONTIN) 100 MG capsule Take 1 capsule (100 mg total) by mouth at bedtime.  . [DISCONTINUED] NON FORMULARY CBD Oil   No facility-administered encounter medications on file as of 08/22/2018.     Allergies: Shellfish allergy; Azithromycin; and Erythromycin  Body mass index is 31.96 kg/m.  Blood pressure 126/80, pulse 67, temperature (!) 97.4 F (36.3 C), temperature source Oral, height 5' 5.5" (1.664 m), weight 195 lb (88.5 kg), SpO2 95 %.  Review of Systems  Constitutional: Positive for fatigue. Negative for activity change, appetite change, chills, diaphoresis, fever and unexpected weight change.  Eyes: Negative for visual disturbance.  Respiratory: Negative for cough, chest tightness, shortness of breath, wheezing and stridor.   Cardiovascular: Negative for chest pain, palpitations and leg swelling.  Gastrointestinal: Negative for abdominal distention, abdominal pain, blood in stool, constipation, diarrhea, nausea and vomiting.  Endocrine: Negative for cold intolerance, heat intolerance, polydipsia, polyphagia and polyuria.  Genitourinary: Negative for difficulty urinating, enuresis and flank pain.  Musculoskeletal: Positive for arthralgias and myalgias. Negative for back pain, gait  problem, joint swelling, neck pain and neck stiffness.  Skin: Negative for color change, pallor, rash and wound.  Neurological: Negative for dizziness and headaches.  Hematological: Does not bruise/bleed easily.  Psychiatric/Behavioral: Negative for confusion, decreased concentration, dysphoric mood, hallucinations, self-injury, sleep disturbance and suicidal ideas. The patient is not nervous/anxious and is not hyperactive.        Objective:   Physical Exam Constitutional:      General: She is not in acute distress.    Appearance: She is well-developed. She is not diaphoretic.  HENT:     Head: Normocephalic and atraumatic.     Right Ear: External ear normal. No decreased hearing noted. Tympanic membrane is not perforated or bulging.  Left Ear: External ear normal. No decreased hearing noted. Tympanic membrane is not perforated or bulging.     Nose: Nose normal. No mucosal edema.     Right Sinus: No maxillary sinus tenderness or frontal sinus tenderness.     Left Sinus: No maxillary sinus tenderness or frontal sinus tenderness.     Mouth/Throat:     Pharynx: Uvula midline.  Eyes:     Conjunctiva/sclera: Conjunctivae normal.     Pupils: Pupils are equal, round, and reactive to light.  Neck:     Musculoskeletal: Normal range of motion and neck supple.  Cardiovascular:     Rate and Rhythm: Normal rate and regular rhythm.     Heart sounds: Normal heart sounds. No murmur.  Pulmonary:     Effort: Pulmonary effort is normal. No respiratory distress.     Breath sounds: Normal breath sounds. No stridor. No decreased breath sounds, wheezing, rhonchi or rales.  Chest:     Chest wall: No mass or tenderness.     Breasts:        Right: No inverted nipple, mass, nipple discharge, skin change or tenderness.        Left: No inverted nipple, mass, nipple discharge, skin change or tenderness.  Abdominal:     General: Bowel sounds are normal. There is no distension.     Palpations: There is no  mass.     Tenderness: There is no abdominal tenderness. There is no guarding or rebound.     Hernia: No hernia is present.  Musculoskeletal: Normal range of motion.        General: Tenderness present.  Lymphadenopathy:     Cervical: No cervical adenopathy.  Skin:    General: Skin is warm and dry.     Capillary Refill: Capillary refill takes less than 2 seconds.     Coloration: Skin is not pale.     Findings: No erythema or rash.  Neurological:     Mental Status: She is alert and oriented to person, place, and time.     Coordination: Coordination normal.  Psychiatric:        Behavior: Behavior normal.        Thought Content: Thought content normal.        Judgment: Judgment normal.       Assessment & Plan:   1. Blood in stool   2. Healthcare maintenance   3. Fibromyalgia   4. Elevated LDL cholesterol level     Healthcare maintenance [place] job your healthy eating, excellent water intake and weight loss! SOOOO PROUD OF YOU!!! Please complete stool cards and return to office. Follow-up in 3 months, please come fasting.  Blood in stool 3 instance of blood in stool in last month Provided IFOBT  Fibromyalgia Stable She stopped using Gabapentin due to sedation using, prefers to use regular exercise to manage pain  Elevated LDL cholesterol level The 10-year ASCVD risk score Mikey Bussing DC Jr., et al., 2013) is: 1.5%   Values used to calculate the score:     Age: 73 years     Sex: Female     Is Non-Hispanic African American: No     Diabetic: No     Tobacco smoker: No     Systolic Blood Pressure: 010 mmHg     Is BP treated: No     HDL Cholesterol: 55 mg/dL     Total Cholesterol: 177 mg/dL  LDL-111    FOLLOW-UP:  Return in about 3 months (around  11/21/2018) for Regular Follow Up, Fasting Labs.

## 2018-08-22 ENCOUNTER — Encounter: Payer: Self-pay | Admitting: Adult Health

## 2018-08-22 ENCOUNTER — Ambulatory Visit (INDEPENDENT_AMBULATORY_CARE_PROVIDER_SITE_OTHER): Payer: BLUE CROSS/BLUE SHIELD | Admitting: Adult Health

## 2018-08-22 VITALS — BP 126/80 | HR 67 | Temp 97.4°F | Ht 65.5 in | Wt 195.0 lb

## 2018-08-22 DIAGNOSIS — M797 Fibromyalgia: Secondary | ICD-10-CM

## 2018-08-22 DIAGNOSIS — Z0000 Encounter for general adult medical examination without abnormal findings: Secondary | ICD-10-CM | POA: Diagnosis not present

## 2018-08-22 DIAGNOSIS — K921 Melena: Secondary | ICD-10-CM

## 2018-08-22 DIAGNOSIS — E7800 Pure hypercholesterolemia, unspecified: Secondary | ICD-10-CM | POA: Diagnosis not present

## 2018-08-22 NOTE — Assessment & Plan Note (Signed)
Stable She stopped using Gabapentin due to sedation using, prefers to use regular exercise to manage pain

## 2018-08-22 NOTE — Assessment & Plan Note (Signed)
GREAT GREAT GREAT job your healthy eating, excellent water intake and weight loss! SOOOO PROUD OF YOU!!! Please complete stool cards and return to office. Follow-up in 3 months, please come fasting.

## 2018-08-22 NOTE — Assessment & Plan Note (Signed)
3 instance of blood in stool in last month Provided IFOBT

## 2018-08-22 NOTE — Patient Instructions (Signed)
Mediterranean Diet A Mediterranean diet refers to food and lifestyle choices that are based on the traditions of countries located on the [employer]. This way of eating has been shown to help prevent certain conditions and improve outcomes for people who have chronic diseases, like kidney disease and heart disease. What are tips for following this plan? Lifestyle  Cook and eat meals together with your family, when possible.  Drink enough fluid to keep your urine clear or pale yellow.  Be physically active every day. This includes: ? Aerobic exercise like running or swimming. ? Leisure activities like gardening, walking, or housework.  Get 7-8 hours of sleep each night.  If recommended by your health care provider, drink red wine in moderation. This means 1 glass a day for nonpregnant women and 2 glasses a day for men. A glass of wine equals 5 oz (150 mL). Reading food labels   Check the serving size of packaged foods. For foods such as rice and pasta, the serving size refers to the amount of cooked product, not dry.  Check the total fat in packaged foods. Avoid foods that have saturated fat or trans fats.  Check the ingredients list for added sugars, such as corn syrup. Shopping  At the grocery store, buy most of your food from the areas near the walls of the store. This includes: ? Fresh fruits and vegetables (produce). ? Grains, beans, nuts, and seeds. Some of these may be available in unpackaged forms or large amounts (in bulk). ? Fresh seafood. ? Poultry and eggs. ? Low-fat dairy products.  Buy whole ingredients instead of prepackaged foods.  Buy fresh fruits and vegetables in-season from local farmers markets.  Buy frozen fruits and vegetables in resealable bags.  If you do not have access to quality fresh seafood, buy precooked frozen shrimp or canned fish, such as tuna, salmon, or sardines.  Buy small amounts of raw or cooked vegetables, salads, or olives from  the deli or salad bar at your store.  Stock your pantry so you always have certain foods on hand, such as olive oil, canned tuna, canned tomatoes, rice, pasta, and beans. Cooking  Cook foods with extra-virgin olive oil instead of using butter or other vegetable oils.  Have meat as a side dish, and have vegetables or grains as your main dish. This means having meat in small portions or adding small amounts of meat to foods like pasta or stew.  Use beans or vegetables instead of meat in common dishes like chili or lasagna.  Experiment with different cooking methods. Try roasting or broiling vegetables instead of steaming or sauteing them.  Add frozen vegetables to soups, stews, pasta, or rice.  Add nuts or seeds for added healthy fat at each meal. You can add these to yogurt, salads, or vegetable dishes.  Marinate fish or vegetables using olive oil, lemon juice, garlic, and fresh herbs. Meal planning   Plan to eat 1 vegetarian meal one day each week. Try to work up to 2 vegetarian meals, if possible.  Eat seafood 2 or more times a week.  Have healthy snacks readily available, such as: ? Vegetable sticks with hummus. ? [place] yogurt. ? Fruit and nut trail mix.  Eat balanced meals throughout the week. This includes: ? Fruit: 2-3 servings a day ? Vegetables: 4-5 servings a day ? Low-fat dairy: 2 servings a day ? Fish, poultry, or lean meat: 1 serving a day ? Beans and legumes: 2 or more servings a week ?  Nuts and seeds: 1-2 servings a day ? Whole grains: 6-8 servings a day ? Extra-virgin olive oil: 3-4 servings a day  Limit red meat and sweets to only a few servings a month What are my food choices?  Mediterranean diet ? Recommended ? Grains: Whole-grain pasta. Brown rice. Bulgar wheat. Polenta. Couscous. Whole-wheat bread. [place]. ? Vegetables: Artichokes. Beets. Broccoli. Cabbage. Carrots. Eggplant. Green beans. Chard. Kale. Spinach. Onions. Leeks. Peas. Squash.  Tomatoes. Peppers. Radishes. ? Fruits: Apples. Apricots. Avocado. Berries. Bananas. Cherries. Dates. Figs. Grapes. Lemons. Melon. Oranges. Peaches. Plums. Pomegranate. ? Meats and other protein foods: Beans. Almonds. Sunflower seeds. Pine nuts. Peanuts. [place]. Salmon. [place]all[place]. Shrimp. [place]. Tilapia. Clams. Oysters. Eggs. ? Dairy: Low-fat milk. Cheese. Greek yogurt. ? Beverages: Water. Red wine. Herbal tea. ? Fats and oils: Extra virgin olive oil. Avocado oil. Grape seed oil. ? Sweets and desserts: [place] yogurt with honey. Baked apples. Poached pears. Trail mix. ? Seasoning and other foods: Basil. Cilantro. Coriander. Cumin. Mint. Parsley. Sage. Rosemary. Tarragon. Garlic. Oregano. Thyme. Pepper. Balsalmic vinegar. Tahini. Hummus. Tomato sauce. Olives. Mushrooms. ? Limit these ? Grains: Prepackaged pasta or rice dishes. Prepackaged cereal with added sugar. ? Vegetables: Deep fried potatoes (french fries). ? Fruits: Fruit canned in syrup. ? Meats and other protein foods: Beef. Pork. Lamb. Poultry with skin. Hot dogs. [place] Salines. ? Dairy: Ice cream. Sour cream. Whole milk. ? Beverages: Juice. Sugar-sweetened soft drinks. Beer. Liquor and spirits. ? Fats and oils: Butter. Canola oil. Vegetable oil. Beef fat (tallow). Lard. ? Sweets and desserts: Cookies. Cakes. Pies. Candy. ? Seasoning and other foods: Mayonnaise. Premade sauces and marinades. ? The items listed may not be a complete list. Talk with your dietitian about what dietary choices are right for you. Summary  The Mediterranean diet includes both food and lifestyle choices.  Eat a variety of fresh fruits and vegetables, beans, nuts, seeds, and whole grains.  Limit the amount of red meat and sweets that you eat.  Talk with your health care provider about whether it is safe for you to drink red wine in moderation. This means 1 glass a day for nonpregnant women and 2 glasses a day for men. A glass of wine equals 5 oz (150 mL). This information  is not intended to replace advice given to you by your health care provider. Make sure you discuss any questions you have with your health care provider. Document Released: 04/13/2016 Document Revised: 05/16/2016 Document Reviewed: 04/13/2016 Elsevier Interactive Patient Education  2019 [place] job your healthy eating, excellent water intake and weight loss! SOOOO PROUD OF YOU!!! Please complete stool cards and return to office. Follow-up in 3 months, please come fasting. GREAT TO SEE YOU!

## 2018-08-22 NOTE — Assessment & Plan Note (Signed)
The 10-year ASCVD risk score Mikey Bussing DC Brooke Bonito., et al., 2013) is: 1.5%   Values used to calculate the score:     Age: 52 years     Sex: Female     Is Non-Hispanic African American: No     Diabetic: No     Tobacco smoker: No     Systolic Blood Pressure: 601 mmHg     Is BP treated: No     HDL Cholesterol: 55 mg/dL     Total Cholesterol: 177 mg/dL  LDL-111

## 2018-08-23 ENCOUNTER — Ambulatory Visit (INDEPENDENT_AMBULATORY_CARE_PROVIDER_SITE_OTHER): Payer: BLUE CROSS/BLUE SHIELD

## 2018-08-23 VITALS — BP 126/79 | HR 76 | Temp 98.3°F

## 2018-08-23 DIAGNOSIS — Z23 Encounter for immunization: Secondary | ICD-10-CM

## 2018-08-23 NOTE — Progress Notes (Signed)
Pt here for influenza vaccine.  Screening questionnaire reviewed, VIS provided to patient, and any/all patient questions answered.  T. Nelson, CMA  

## 2018-08-23 NOTE — Addendum Note (Signed)
Addended by: Fonnie Mu on: 08/23/2018 09:36 AM   Modules accepted: Orders

## 2018-09-24 ENCOUNTER — Encounter: Payer: Self-pay | Admitting: Adult Health

## 2018-11-10 ENCOUNTER — Other Ambulatory Visit: Payer: Self-pay | Admitting: Adult Health

## 2018-11-18 NOTE — Progress Notes (Deleted)
Subjective:    Patient ID: Carol Jenkins, female    DOB: 1965/11/02, 53 y.o.   MRN: [id number]  HPI:  03/19/18 OV: Ms. Raupp is here for CPE She reports medication compliance, denies SE She was seen last month for increase in fatigue and fibromyalgia. She continues to increase regular exercise (walking and stationary bike) and has been following a combination of Weight Watchers and Paleo diet. She has lost another 5 lbs since last OV 02/26/18 She has been using OTC CBD oil nightly to treat insomnia and reports much improved  Reviewed most recent labs She needs fasting labs, she will return for lab draw int he next few days  08/22/18 OV: Ms. Yetman is here for regular f/u: She continues to walk or ride her bike >40 mins 4 times/week She continues to drink >68 oz/day and follows diet high in fruits/vegetebles/proteins, low CHO She has lost >40 lbs in 10 months She reports "feeling better than I have in years!" She reports scant hematochezia 3 times over the last 4 weeks She reports mild, brief, intermittent upper adb pain that has been occurring for >2 years and she believes "it is r/t my liver cysts" She has stopped using Gabapentin due to sedation issues  The 10-year ASCVD risk score Mikey Bussing DC Brooke Bonito., et al., 2013) is: 1.5%   Values used to calculate the score:     Age: 69 years     Sex: Female     Is Non-Hispanic African American: No     Diabetic: No     Tobacco smoker: No     Systolic Blood Pressure: 003 mmHg     Is BP treated: No     HDL Cholesterol: 55 mg/dL     Total Cholesterol: 177 mg/dL  LDL-111  11/21/2018 OV: Ms. Neace is here for 3 month  Patient Care Team    Relationship Specialty Notifications Start End  Esaw Grandchild, NP PCP - General Family Medicine  01/30/17     Patient Active Problem List   Diagnosis Date Noted  . Blood in stool 08/22/2018  . Elevated LDL cholesterol level 08/22/2018  . Chest tightness 02/26/2018  . Low libido 02/26/2018  . Nausea  11/21/2017  . S/P hysterectomy with oophorectomy 2014- endometriosis 05/28/2017  . Status post colonoscopy with polypectomy 05/28/2017  . H/O mammogram- 7/17- N 05/28/2017  . Epigastric pain- chronic pain 05/28/2017  . Fatigue 05/28/2017  . Healthcare maintenance 01/30/2017  . Toe pain, bilateral 01/30/2017  . Tendonitis, Achilles, left 01/30/2017  . Hypothyroidism 01/30/2017  . Fibromyalgia 01/30/2017     Past Medical History:  Diagnosis Date  . Fatty liver 06/2016  . Fibromyalgia   . Gastric polyp   . Gastritis   . Headache syndrome    hemiparegic  . Hyperlipidemia   . Hypothyroidism   . Liver cyst   . Migraines   . Neuromuscular disorder (HCC)    Fibromyalgia  . Partial tear of Achilles tendon   . Thyroid disease   . Tubular adenoma of colon      Past Surgical History:  Procedure Laterality Date  . ABDOMINAL HYSTERECTOMY    . CRUCIATE LIGAMENT REPAIR    . CYSTOSCOPY    . DILATION AND CURETTAGE OF UTERUS    . NASAL SEPTUM SURGERY    . PARTIAL HYSTERECTOMY     with laparoscopy- completed hysterectomy (previously had left salpingo-oophorectomy)  . SALPINGOOPHORECTOMY Left   . TUBAL LIGATION       Family History  Problem Relation Age of Onset  . Alcohol abuse Mother   . Hyperlipidemia Mother   . Hypertension Mother   . Alcohol abuse Father   . Hyperlipidemia Father   . Hypertension Father   . Alcohol abuse Brother   . Diabetes Son   . Lung cancer Maternal Uncle   . Heart attack Paternal Uncle   . Hypothyroidism Paternal Uncle   . Alcohol abuse Maternal Grandmother   . Hyperlipidemia Maternal Grandmother   . Hypertension Maternal Grandmother   . Alcohol abuse Maternal Grandfather   . Cancer Paternal Grandmother        abdominal  . Heart attack Paternal Grandfather   . Alcohol abuse Brother   . Stomach cancer Maternal Uncle   . Heart attack Paternal Uncle   . Hypothyroidism Paternal Uncle   . Heart attack Paternal Uncle   . Hypothyroidism Paternal  Uncle      Social History   Substance and Sexual Activity  Drug Use No     Social History   Substance and Sexual Activity  Alcohol Use No     Social History   Tobacco Use  Smoking Status Former Smoker  . Packs/day: 1.00  . Years: 15.00  . Pack years: 15.00  . Last attempt to quit: 09/04/1998  . Years since quitting: 20.2  Smokeless Tobacco Never Used     Outpatient Encounter Medications as of 11/21/2018  Medication Sig  . Ascorbic Acid (VITAMIN C PO) Take by mouth.  . Cholecalciferol (VITAMIN D3) 2000 units capsule Take 4,000 Units by mouth daily.  . Cyanocobalamin (B-12 PO) Take by mouth.  . Magnesium 100 MG TABS   . SYNTHROID 100 MCG tablet TAKE 1 TABLET BY MOUTH EVERY DAY BEFORE BREAKFAST   No facility-administered encounter medications on file as of 11/21/2018.     Allergies: Shellfish allergy; Azithromycin; and Erythromycin  There is no height or weight on file to calculate BMI.  There were no vitals taken for this visit.  Review of Systems  Constitutional: Positive for fatigue. Negative for activity change, appetite change, chills, diaphoresis, fever and unexpected weight change.  Eyes: Negative for visual disturbance.  Respiratory: Negative for cough, chest tightness, shortness of breath, wheezing and stridor.   Cardiovascular: Negative for chest pain, palpitations and leg swelling.  Gastrointestinal: Negative for abdominal distention, abdominal pain, blood in stool, constipation, diarrhea, nausea and vomiting.  Endocrine: Negative for cold intolerance, heat intolerance, polydipsia, polyphagia and polyuria.  Genitourinary: Negative for difficulty urinating, enuresis and flank pain.  Musculoskeletal: Positive for arthralgias and myalgias. Negative for back pain, gait problem, joint swelling, neck pain and neck stiffness.  Skin: Negative for color change, pallor, rash and wound.  Neurological: Negative for dizziness and headaches.  Hematological: Does not  bruise/bleed easily.  Psychiatric/Behavioral: Negative for confusion, decreased concentration, dysphoric mood, hallucinations, self-injury, sleep disturbance and suicidal ideas. The patient is not nervous/anxious and is not hyperactive.        Objective:   Physical Exam Constitutional:      General: She is not in acute distress.    Appearance: She is well-developed. She is not diaphoretic.  HENT:     Head: Normocephalic and atraumatic.     Right Ear: External ear normal. No decreased hearing noted. Tympanic membrane is not perforated or bulging.     Left Ear: External ear normal. No decreased hearing noted. Tympanic membrane is not perforated or bulging.     Nose: Nose normal. No mucosal edema.  Right Sinus: No maxillary sinus tenderness or frontal sinus tenderness.     Left Sinus: No maxillary sinus tenderness or frontal sinus tenderness.     Mouth/Throat:     Pharynx: Uvula midline.  Eyes:     Conjunctiva/sclera: Conjunctivae normal.     Pupils: Pupils are equal, round, and reactive to light.  Neck:     Musculoskeletal: Normal range of motion and neck supple.  Cardiovascular:     Rate and Rhythm: Normal rate and regular rhythm.     Heart sounds: Normal heart sounds. No murmur.  Pulmonary:     Effort: Pulmonary effort is normal. No respiratory distress.     Breath sounds: Normal breath sounds. No stridor. No decreased breath sounds, wheezing, rhonchi or rales.  Chest:     Chest wall: No mass or tenderness.     Breasts:        Right: No inverted nipple, mass, nipple discharge, skin change or tenderness.        Left: No inverted nipple, mass, nipple discharge, skin change or tenderness.  Abdominal:     General: Bowel sounds are normal. There is no distension.     Palpations: There is no mass.     Tenderness: There is no abdominal tenderness. There is no guarding or rebound.     Hernia: No hernia is present.  Musculoskeletal: Normal range of motion.        General:  Tenderness present.  Lymphadenopathy:     Cervical: No cervical adenopathy.  Skin:    General: Skin is warm and dry.     Capillary Refill: Capillary refill takes less than 2 seconds.     Coloration: Skin is not pale.     Findings: No erythema or rash.  Neurological:     Mental Status: She is alert and oriented to person, place, and time.     Coordination: Coordination normal.  Psychiatric:        Behavior: Behavior normal.        Thought Content: Thought content normal.        Judgment: Judgment normal.       Assessment & Plan:   No diagnosis found.  No problem-specific Assessment & Plan notes found for this encounter.    FOLLOW-UP:  No follow-ups on file.

## 2018-11-21 ENCOUNTER — Ambulatory Visit: Payer: BLUE CROSS/BLUE SHIELD | Admitting: Adult Health

## 2018-11-29 DIAGNOSIS — R05 Cough: Secondary | ICD-10-CM | POA: Diagnosis not present

## 2018-11-29 DIAGNOSIS — Z03818 Encounter for observation for suspected exposure to other biological agents ruled out: Secondary | ICD-10-CM | POA: Diagnosis not present

## 2019-02-03 ENCOUNTER — Other Ambulatory Visit: Payer: Self-pay | Admitting: Adult Health

## 2019-03-10 ENCOUNTER — Other Ambulatory Visit: Payer: Self-pay

## 2019-03-10 ENCOUNTER — Ambulatory Visit: Payer: Self-pay | Admitting: Adult Health

## 2019-03-10 NOTE — Progress Notes (Deleted)
Subjective:    Patient ID: Carol Jenkins, female    DOB: 16-Aug-1966, 53 y.o.   MRN: [id number]  HPI:08/22/18 OV: Carol Jenkins is here for regular f/u: She continues to walk or ride her bike >40 mins 4 times/week She continues to drink >68 oz/day and follows diet high in fruits/vegetebles/proteins, low CHO She has lost >40 lbs in 10 months She reports "feeling better than I have in years!" She reports scant hematochezia 3 times over the last 4 weeks She reports mild, brief, intermittent upper adb pain that has been occurring for >2 years and she believes "it is r/t my liver cysts" She has stopped using Gabapentin due to sedation issues  The 10-year ASCVD risk score Mikey Bussing DC Brooke Bonito., et al., 2013) is: 1.5%   Values used to calculate the score:     Age: 8 years     Sex: Female     Is Non-Hispanic African American: No     Diabetic: No     Tobacco smoker: No     Systolic Blood Pressure: 007 mmHg     Is BP treated: No     HDL Cholesterol: 55 mg/dL     Total Cholesterol: 177 mg/dL  LDL-111   03/10/2019 OV:  Carol Jenkins is here for  TSH drawn today  Patient Care Team    Relationship Specialty Notifications Start End  Esaw Grandchild, NP PCP - General Family Medicine  01/30/17     Patient Active Problem List   Diagnosis Date Noted  . Blood in stool 08/22/2018  . Elevated LDL cholesterol level 08/22/2018  . Chest tightness 02/26/2018  . Low libido 02/26/2018  . Nausea 11/21/2017  . S/P hysterectomy with oophorectomy 2014- endometriosis 05/28/2017  . Status post colonoscopy with polypectomy 05/28/2017  . H/O mammogram- 7/17- N 05/28/2017  . Epigastric pain- chronic pain 05/28/2017  . Fatigue 05/28/2017  . Healthcare maintenance 01/30/2017  . Toe pain, bilateral 01/30/2017  . Tendonitis, Achilles, left 01/30/2017  . Hypothyroidism 01/30/2017  . Fibromyalgia 01/30/2017     Past Medical History:  Diagnosis Date  . Fatty liver 06/2016  . Fibromyalgia   . Gastric polyp   .  Gastritis   . Headache syndrome    hemiparegic  . Hyperlipidemia   . Hypothyroidism   . Liver cyst   . Migraines   . Neuromuscular disorder (HCC)    Fibromyalgia  . Partial tear of Achilles tendon   . Thyroid disease   . Tubular adenoma of colon      Past Surgical History:  Procedure Laterality Date  . ABDOMINAL HYSTERECTOMY    . CRUCIATE LIGAMENT REPAIR    . CYSTOSCOPY    . DILATION AND CURETTAGE OF UTERUS    . NASAL SEPTUM SURGERY    . PARTIAL HYSTERECTOMY     with laparoscopy- completed hysterectomy (previously had left salpingo-oophorectomy)  . SALPINGOOPHORECTOMY Left   . TUBAL LIGATION       Family History  Problem Relation Age of Onset  . Alcohol abuse Mother   . Hyperlipidemia Mother   . Hypertension Mother   . Alcohol abuse Father   . Hyperlipidemia Father   . Hypertension Father   . Alcohol abuse Brother   . Diabetes Son   . Lung cancer Maternal Uncle   . Heart attack Paternal Uncle   . Hypothyroidism Paternal Uncle   . Alcohol abuse Maternal Grandmother   . Hyperlipidemia Maternal Grandmother   . Hypertension Maternal Grandmother   .  Alcohol abuse Maternal Grandfather   . Cancer Paternal Grandmother        abdominal  . Heart attack Paternal Grandfather   . Alcohol abuse Brother   . Stomach cancer Maternal Uncle   . Heart attack Paternal Uncle   . Hypothyroidism Paternal Uncle   . Heart attack Paternal Uncle   . Hypothyroidism Paternal Uncle      Social History   Substance and Sexual Activity  Drug Use No     Social History   Substance and Sexual Activity  Alcohol Use No     Social History   Tobacco Use  Smoking Status Former Smoker  . Packs/day: 1.00  . Years: 15.00  . Pack years: 15.00  . Quit date: 09/04/1998  . Years since quitting: 20.5  Smokeless Tobacco Never Used     Outpatient Encounter Medications as of 03/10/2019  Medication Sig  . Ascorbic Acid (VITAMIN C PO) Take by mouth.  . Cholecalciferol (VITAMIN D3) 2000  units capsule Take 4,000 Units by mouth daily.  . Cyanocobalamin (B-12 PO) Take by mouth.  . Magnesium 100 MG TABS   . SYNTHROID 100 MCG tablet Take 1 tablet (100 mcg total) by mouth daily before breakfast. Needs office visit for refill   No facility-administered encounter medications on file as of 03/10/2019.     Allergies: Shellfish allergy, Azithromycin, and Erythromycin  There is no height or weight on file to calculate BMI.  There were no vitals taken for this visit.     Review of Systems     Objective:   Physical Exam        Assessment & Plan:  No diagnosis found.  No problem-specific Assessment & Plan notes found for this encounter.    FOLLOW-UP:  No follow-ups on file.

## 2019-03-13 ENCOUNTER — Other Ambulatory Visit: Payer: Self-pay | Admitting: Adult Health

## 2019-03-13 ENCOUNTER — Other Ambulatory Visit: Payer: Self-pay

## 2019-03-13 ENCOUNTER — Ambulatory Visit (INDEPENDENT_AMBULATORY_CARE_PROVIDER_SITE_OTHER): Payer: BC Managed Care – PPO | Admitting: Adult Health

## 2019-03-13 ENCOUNTER — Other Ambulatory Visit (INDEPENDENT_AMBULATORY_CARE_PROVIDER_SITE_OTHER): Payer: BC Managed Care – PPO

## 2019-03-13 ENCOUNTER — Encounter: Payer: Self-pay | Admitting: Adult Health

## 2019-03-13 VITALS — BP 106/69 | HR 67 | Temp 98.0°F | Ht 65.5 in | Wt 208.4 lb

## 2019-03-13 DIAGNOSIS — E039 Hypothyroidism, unspecified: Secondary | ICD-10-CM | POA: Diagnosis not present

## 2019-03-13 DIAGNOSIS — Z0000 Encounter for general adult medical examination without abnormal findings: Secondary | ICD-10-CM | POA: Diagnosis not present

## 2019-03-13 DIAGNOSIS — Z6834 Body mass index (BMI) 34.0-34.9, adult: Secondary | ICD-10-CM | POA: Diagnosis not present

## 2019-03-13 DIAGNOSIS — E7800 Pure hypercholesterolemia, unspecified: Secondary | ICD-10-CM

## 2019-03-13 DIAGNOSIS — K921 Melena: Secondary | ICD-10-CM

## 2019-03-13 LAB — IFOBT (OCCULT BLOOD)
IFOBT: NEGATIVE
IFOBT: NEGATIVE
IFOBT: NEGATIVE

## 2019-03-13 NOTE — Assessment & Plan Note (Signed)
TSH drawn Currently on Synthroid 151mcg QD

## 2019-03-13 NOTE — Progress Notes (Signed)
Subjective:    Patient ID: Carol Jenkins, female    DOB: 13-Jan-1966, 53 y.o.   MRN: [id number]  HPI:  [name] presents with several issues: 1) Fasting labs and TSH level due today She reports stable fatigue level No hair loss Lipid Panel 04/19/2018 The 10-year ASCVD risk score Mikey Bussing DC Brooke Bonito., et al., 2013) is: 1.2%   Values used to calculate the score:     Age: 61 years     Sex: Female     Is Non-Hispanic African American: No     Diabetic: No     Tobacco smoker: No     Systolic Blood Pressure: 778 mmHg     Is BP treated: No     HDL Cholesterol: 55 mg/dL     Total Cholesterol: 177 mg/dL  LDL-111 2) Low libido and vaginal dryness Provided list of OTC vaginal lubricants and recommended to f/u with her OB/GYN She has had hysterectomy 3) Weight gain due to lack of exercise and increased intake of fast food. Current wt 208 Body mass index is 34.15 kg/m.  She started a new [employer] position March 2020 She travels 5 days/week- visits 6-12 pts/day Works 10-12 hrs/day She has stopped her evening meditation 4) Concerns about her husband's health, he is also pt here- HTN,decline in memory He is 60 years older They have been together >21 years. Several adult children lives only a few miles away   Patient Care Team    Relationship Specialty Notifications Start End  Danford, Berna Spare, NP PCP - General Family Medicine  01/30/17     Patient Active Problem List   Diagnosis Date Noted  . BMI 34.0-34.9,adult 03/13/2019  . Blood in stool 08/22/2018  . Elevated LDL cholesterol level 08/22/2018  . Chest tightness 02/26/2018  . Low libido 02/26/2018  . Nausea 11/21/2017  . S/P hysterectomy with oophorectomy 2014- endometriosis 05/28/2017  . Status post colonoscopy with polypectomy 05/28/2017  . H/O mammogram- 7/17- N 05/28/2017  . Epigastric pain- chronic pain 05/28/2017  . Fatigue 05/28/2017  . Healthcare maintenance 01/30/2017  . Toe pain, bilateral 01/30/2017  .  Tendonitis, Achilles, left 01/30/2017  . Hypothyroidism 01/30/2017  . Fibromyalgia 01/30/2017     Past Medical History:  Diagnosis Date  . Fatty liver 06/2016  . Fibromyalgia   . Gastric polyp   . Gastritis   . Headache syndrome    hemiparegic  . Hyperlipidemia   . Hypothyroidism   . Liver cyst   . Migraines   . Neuromuscular disorder (HCC)    Fibromyalgia  . Partial tear of Achilles tendon   . Thyroid disease   . Tubular adenoma of colon      Past Surgical History:  Procedure Laterality Date  . ABDOMINAL HYSTERECTOMY    . CRUCIATE LIGAMENT REPAIR    . CYSTOSCOPY    . DILATION AND CURETTAGE OF UTERUS    . NASAL SEPTUM SURGERY    . PARTIAL HYSTERECTOMY     with laparoscopy- completed hysterectomy (previously had left salpingo-oophorectomy)  . SALPINGOOPHORECTOMY Left   . TUBAL LIGATION       Family History  Problem Relation Age of Onset  . Alcohol abuse Mother   . Hyperlipidemia Mother   . Hypertension Mother   . Alcohol abuse Father   . Hyperlipidemia Father   . Hypertension Father   . Alcohol abuse Brother   . Diabetes Son   . Lung cancer Maternal Uncle   . Heart attack Paternal  Uncle   . Hypothyroidism Paternal Uncle   . Alcohol abuse Maternal Grandmother   . Hyperlipidemia Maternal Grandmother   . Hypertension Maternal Grandmother   . Alcohol abuse Maternal Grandfather   . Cancer Paternal Grandmother        abdominal  . Heart attack Paternal Grandfather   . Alcohol abuse Brother   . Stomach cancer Maternal Uncle   . Heart attack Paternal Uncle   . Hypothyroidism Paternal Uncle   . Heart attack Paternal Uncle   . Hypothyroidism Paternal Uncle      Social History   Substance and Sexual Activity  Drug Use No     Social History   Substance and Sexual Activity  Alcohol Use No     Social History   Tobacco Use  Smoking Status Former Smoker  . Packs/day: 1.00  . Years: 15.00  . Pack years: 15.00  . Quit date: 09/04/1998  . Years  since quitting: 20.5  Smokeless Tobacco Never Used     Outpatient Encounter Medications as of 03/13/2019  Medication Sig  . Ascorbic Acid (VITAMIN C PO) Take by mouth.  . Cholecalciferol (VITAMIN D3) 2000 units capsule Take 4,000 Units by mouth daily.  . Cyanocobalamin (B-12 PO) Take by mouth.  . Magnesium 100 MG TABS   . Multiple Vitamin (MULTIVITAMIN) tablet Take 1 tablet by mouth daily.  Marland Kitchen SYNTHROID 100 MCG tablet Take 1 tablet (100 mcg total) by mouth daily before breakfast. Needs office visit for refill   No facility-administered encounter medications on file as of 03/13/2019.     Allergies: Shellfish allergy, Azithromycin, and Erythromycin  Body mass index is 34.15 kg/m.  Blood pressure 106/69, pulse 67, temperature 98 F (36.7 C), temperature source Oral, height 5' 5.5" (1.664 m), weight 208 lb 6.4 oz (94.5 kg), SpO2 99 %.  Review of Systems  Constitutional: Positive for fatigue. Negative for activity change, appetite change, chills, diaphoresis, fever and unexpected weight change.  Eyes: Negative for visual disturbance.  Respiratory: Negative for cough, chest tightness, shortness of breath, wheezing and stridor.   Cardiovascular: Negative for chest pain, palpitations and leg swelling.  Gastrointestinal: Negative for abdominal distention, anal bleeding, blood in stool, constipation, diarrhea, nausea, rectal pain and vomiting.  Genitourinary: Negative for difficulty urinating and flank pain.       Vaginal Dryness   Neurological: Negative for dizziness and facial asymmetry.  Hematological: Negative for adenopathy. Does not bruise/bleed easily.  Psychiatric/Behavioral: Negative for agitation, behavioral problems, confusion, decreased concentration, dysphoric mood, hallucinations, self-injury, sleep disturbance and suicidal ideas. The patient is not nervous/anxious and is not hyperactive.        Objective:   Physical Exam Vitals signs and nursing note reviewed.   Constitutional:      General: She is not in acute distress.    Appearance: Normal appearance. She is obese. She is not ill-appearing, toxic-appearing or diaphoretic.  HENT:     Head: Normocephalic and atraumatic.  Eyes:     Extraocular Movements: Extraocular movements intact.     Conjunctiva/sclera: Conjunctivae normal.     Pupils: Pupils are equal, round, and reactive to light.  Cardiovascular:     Rate and Rhythm: Normal rate and regular rhythm.     Pulses: Normal pulses.     Heart sounds: Normal heart sounds. No murmur. No gallop.   Pulmonary:     Effort: Pulmonary effort is normal. No respiratory distress.     Breath sounds: Normal breath sounds. No wheezing, rhonchi or rales.  Chest:     Chest wall: No tenderness.  Skin:    Capillary Refill: Capillary refill takes less than 2 seconds.  Neurological:     Mental Status: She is alert and oriented to person, place, and time.  Psychiatric:        Mood and Affect: Mood normal.        Behavior: Behavior normal.        Thought Content: Thought content normal.        Judgment: Judgment normal.       Assessment & Plan:   1. Hypothyroidism, unspecified type   2. Elevated LDL cholesterol level   3. Healthcare maintenance   4. BMI 34.0-34.9,adult     Healthcare maintenance 1) Increase water intake, strive for at least 100 ounces/day.   Follow Mediterranean diet. Increase regular exercise.  Recommend at least 30 minutes daily, 5 days per week of walking, biking, swimming, YouTube/Pinterest workout videos. 2) Resume nightly relaxation/meditation. 3) We will call you when lab results are available. 4) Reduce pt load to max 8 per day. 5) Follow-up with OB/GYN, re: vaginal dryness. 6) Schedule safe ([place] home) trip with Waunita Schooner in the next 1-2 months for some down time. 7) Follow-up in 3 months, please come fasting and we will re-check lipid panel. 8) Continue to social distance and wear a mask when in public.  Hypothyroidism TSH  drawn Currently on Synthroid 132mcg QD  BMI 34.0-34.9,adult Body mass index is 34.15 kg/m.  Current wt 208    FOLLOW-UP:  Return in about 3 months (around 06/13/2019) for Obesity, Hypercholestermia, Fasting Labs.

## 2019-03-13 NOTE — Assessment & Plan Note (Signed)
Body mass index is 34.15 kg/m.  Current wt 208

## 2019-03-13 NOTE — Patient Instructions (Signed)
Mediterranean Diet A Mediterranean diet refers to food and lifestyle choices that are based on the traditions of countries located on the [employer]. This way of eating has been shown to help prevent certain conditions and improve outcomes for people who have chronic diseases, like kidney disease and heart disease. What are tips for following this plan? Lifestyle  Cook and eat meals together with your family, when possible.  Drink enough fluid to keep your urine clear or pale yellow.  Be physically active every day. This includes: ? Aerobic exercise like running or swimming. ? Leisure activities like gardening, walking, or housework.  Get 7-8 hours of sleep each night.  If recommended by your health care provider, drink red wine in moderation. This means 1 glass a day for nonpregnant women and 2 glasses a day for men. A glass of wine equals 5 oz (150 mL). Reading food labels   Check the serving size of packaged foods. For foods such as rice and pasta, the serving size refers to the amount of cooked product, not dry.  Check the total fat in packaged foods. Avoid foods that have saturated fat or trans fats.  Check the ingredients list for added sugars, such as corn syrup. Shopping  At the grocery store, buy most of your food from the areas near the walls of the store. This includes: ? Fresh fruits and vegetables (produce). ? Grains, beans, nuts, and seeds. Some of these may be available in unpackaged forms or large amounts (in bulk). ? Fresh seafood. ? Poultry and eggs. ? Low-fat dairy products.  Buy whole ingredients instead of prepackaged foods.  Buy fresh fruits and vegetables in-season from local farmers markets.  Buy frozen fruits and vegetables in resealable bags.  If you do not have access to quality fresh seafood, buy precooked frozen shrimp or canned fish, such as tuna, salmon, or sardines.  Buy small amounts of raw or cooked vegetables, salads, or olives from  the deli or salad bar at your store.  Stock your pantry so you always have certain foods on hand, such as olive oil, canned tuna, canned tomatoes, rice, pasta, and beans. Cooking  Cook foods with extra-virgin olive oil instead of using butter or other vegetable oils.  Have meat as a side dish, and have vegetables or grains as your main dish. This means having meat in small portions or adding small amounts of meat to foods like pasta or stew.  Use beans or vegetables instead of meat in common dishes like chili or lasagna.  Experiment with different cooking methods. Try roasting or broiling vegetables instead of steaming or sauteing them.  Add frozen vegetables to soups, stews, pasta, or rice.  Add nuts or seeds for added healthy fat at each meal. You can add these to yogurt, salads, or vegetable dishes.  Marinate fish or vegetables using olive oil, lemon juice, garlic, and fresh herbs. Meal planning   Plan to eat 1 vegetarian meal one day each week. Try to work up to 2 vegetarian meals, if possible.  Eat seafood 2 or more times a week.  Have healthy snacks readily available, such as: ? Vegetable sticks with hummus. ? [place] yogurt. ? Fruit and nut trail mix.  Eat balanced meals throughout the week. This includes: ? Fruit: 2-3 servings a day ? Vegetables: 4-5 servings a day ? Low-fat dairy: 2 servings a day ? Fish, poultry, or lean meat: 1 serving a day ? Beans and legumes: 2 or more servings a week ?  Nuts and seeds: 1-2 servings a day ? Whole grains: 6-8 servings a day ? Extra-virgin olive oil: 3-4 servings a day  Limit red meat and sweets to only a few servings a month What are my food choices?  Mediterranean diet ? Recommended  Grains: Whole-grain pasta. Brown rice. Bulgar wheat. Polenta. Couscous. Whole-wheat bread. [place].  Vegetables: Artichokes. Beets. Broccoli. Cabbage. Carrots. Eggplant. Green beans. Chard. Kale. Spinach. Onions. Leeks. Peas. Squash.  Tomatoes. Peppers. Radishes.  Fruits: Apples. Apricots. Avocado. Berries. Bananas. Cherries. Dates. Figs. Grapes. Lemons. Melon. Oranges. Peaches. Plums. Pomegranate.  Meats and other protein foods: Beans. Almonds. Sunflower seeds. Pine nuts. Peanuts. [place]. Salmon. Scallops. Shrimp. [place]. Tilapia. Clams. Oysters. Eggs.  Dairy: Low-fat milk. Cheese. Greek yogurt.  Beverages: Water. Red wine. Herbal tea.  Fats and oils: Extra virgin olive oil. Avocado oil. Grape seed oil.  Sweets and desserts: [place] yogurt with honey. Baked apples. Poached pears. Trail mix.  Seasoning and other foods: Basil. Cilantro. Coriander. Cumin. Mint. Parsley. Sage. Rosemary. Tarragon. Garlic. Oregano. Thyme. Pepper. Balsalmic vinegar. Tahini. Hummus. Tomato sauce. Olives. Mushrooms. ? Limit these  Grains: Prepackaged pasta or rice dishes. Prepackaged cereal with added sugar.  Vegetables: Deep fried potatoes (french fries).  Fruits: Fruit canned in syrup.  Meats and other protein foods: Beef. Pork. Lamb. Poultry with skin. Hot dogs. Berniece Salines.  Dairy: Ice cream. Sour cream. Whole milk.  Beverages: Juice. Sugar-sweetened soft drinks. Beer. Liquor and spirits.  Fats and oils: Butter. Canola oil. Vegetable oil. Beef fat (tallow). Lard.  Sweets and desserts: Cookies. Cakes. Pies. Candy.  Seasoning and other foods: Mayonnaise. Premade sauces and marinades. The items listed may not be a complete list. Talk with your dietitian about what dietary choices are right for you. Summary  The Mediterranean diet includes both food and lifestyle choices.  Eat a variety of fresh fruits and vegetables, beans, nuts, seeds, and whole grains.  Limit the amount of red meat and sweets that you eat.  Talk with your health care provider about whether it is safe for you to drink red wine in moderation. This means 1 glass a day for nonpregnant women and 2 glasses a day for men. A glass of wine equals 5 oz (150 mL). This information  is not intended to replace advice given to you by your health care provider. Make sure you discuss any questions you have with your health care provider. Document Released: 04/13/2016 Document Revised: 04/20/2016 Document Reviewed: 04/13/2016 Elsevier Patient Education  [place].  1) Increase water intake, strive for at least 100 ounces/day.   Follow Mediterranean diet. Increase regular exercise.  Recommend at least 30 minutes daily, 5 days per week of walking, biking, swimming, YouTube/Pinterest workout videos. 2) Resume nightly relaxation/meditation. 3) We will call you when lab results are available. 4) Reduce pt load to max 8 per day. 5) Follow-up with OB/GYN, re: vaginal dryness. 6) Schedule safe ([place]) trip with Waunita Schooner in the next 1-2 months for some down time. 7) Follow-up in 3 months, please come fasting and we will re-check lipid panel. 8) Continue to social distance and wear a mask when in public. GREAT TO SEE YOU!

## 2019-03-13 NOTE — Assessment & Plan Note (Signed)
1) Increase water intake, strive for at least 100 ounces/day.   Follow Mediterranean diet. Increase regular exercise.  Recommend at least 30 minutes daily, 5 days per week of walking, biking, swimming, YouTube/Pinterest workout videos. 2) Resume nightly relaxation/meditation. 3) We will call you when lab results are available. 4) Reduce pt load to max 8 per day. 5) Follow-up with OB/GYN, re: vaginal dryness. 6) Schedule safe ([place]) trip with Waunita Schooner in the next 1-2 months for some down time. 7) Follow-up in 3 months, please come fasting and we will re-check lipid panel. 8) Continue to social distance and wear a mask when in public.

## 2019-03-14 ENCOUNTER — Encounter: Payer: Self-pay | Admitting: Adult Health

## 2019-03-14 LAB — LIPID PANEL
Chol/HDL Ratio: 3.2 ratio (ref 0.0–4.4)
Cholesterol, Total: 217 mg/dL — ABNORMAL HIGH (ref 100–199)
HDL: 67 mg/dL (ref >39)
LDL Calculated: 136 mg/dL — ABNORMAL HIGH (ref 0–99)
Triglycerides: 71 mg/dL (ref 0–149)
VLDL Cholesterol Cal: 14 mg/dL (ref 5–40)

## 2019-03-14 LAB — TSH: TSH: 1 u[IU]/mL (ref 0.450–4.500)

## 2019-03-19 NOTE — Progress Notes (Signed)
Subjective:    Patient ID: Carol Jenkins, female    DOB: 1965/12/28, 53 y.o.   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 Home was small camper. She noticed "strong odor and taste" upon entering the camper. She states "a blanket was thrown over the counter, covering chemicals I think". She was in the camper 15 mins She had on surgical mask with a cloth mask on top. She returned her company office and called EMS since she experiencing severe nausea, chest burning, and mild shortness of breath. Per pt, her VSS, EKG normal. Her Director of Operations instructed her to be seen by her PCP- not company medical examiner ??? She is currently denying any acute sx's.  2) She went hiking 4 days ago, developed rash on hands that evening- has steadily been worsening. She reports itching and clear oozing from sites. She has hx of allergy to poison ivy/sumac, with even minimal contact   Patient Care Team    Relationship Specialty Notifications Start End  Esaw Grandchild, NP PCP - General Family Medicine  01/30/17     Patient Active Problem List   Diagnosis Date Noted  . Contact dermatitis 03/20/2019  . Work related injury 03/20/2019  . BMI 34.0-34.9,adult 03/13/2019  . Blood in stool 08/22/2018  . Elevated LDL cholesterol level 08/22/2018  . Chest tightness 02/26/2018  . Low libido 02/26/2018  . Nausea 11/21/2017  . S/P hysterectomy with oophorectomy 2014- endometriosis 05/28/2017  . Status post colonoscopy with polypectomy 05/28/2017  . H/O mammogram- 7/17- N 05/28/2017  . Epigastric pain- chronic pain 05/28/2017  . Fatigue 05/28/2017  . Healthcare maintenance 01/30/2017  . Toe pain, bilateral 01/30/2017  . Tendonitis, Achilles, left 01/30/2017  . Hypothyroidism 01/30/2017  . Fibromyalgia 01/30/2017     Past Medical History:  Diagnosis Date  . Fatty liver 06/2016  . Fibromyalgia   .  Gastric polyp   . Gastritis   . Headache syndrome    hemiparegic  . Hyperlipidemia   . Hypothyroidism   . Liver cyst   . Migraines   . Neuromuscular disorder (HCC)    Fibromyalgia  . Partial tear of Achilles tendon   . Thyroid disease   . Tubular adenoma of colon      Past Surgical History:  Procedure Laterality Date  . ABDOMINAL HYSTERECTOMY    . CRUCIATE LIGAMENT REPAIR    . CYSTOSCOPY    . DILATION AND CURETTAGE OF UTERUS    . NASAL SEPTUM SURGERY    . PARTIAL HYSTERECTOMY     with laparoscopy- completed hysterectomy (previously had left salpingo-oophorectomy)  . SALPINGOOPHORECTOMY Left   . TUBAL LIGATION       Family History  Problem Relation Age of Onset  . Alcohol abuse Mother   . Hyperlipidemia Mother   . Hypertension Mother   . Alcohol abuse Father   . Hyperlipidemia Father   . Hypertension Father   . Alcohol abuse Brother   . Diabetes Son   . Lung cancer Maternal Uncle   . Heart attack Paternal Uncle   . Hypothyroidism Paternal Uncle   . Alcohol abuse Maternal Grandmother   . Hyperlipidemia Maternal Grandmother   . Hypertension Maternal Grandmother   . Alcohol abuse Maternal Grandfather   . Cancer Paternal Grandmother        abdominal  . Heart attack Paternal Grandfather   . Alcohol abuse Brother   . Stomach  cancer Maternal Uncle   . Heart attack Paternal Uncle   . Hypothyroidism Paternal Uncle   . Heart attack Paternal Uncle   . Hypothyroidism Paternal Uncle      Social History   Substance and Sexual Activity  Drug Use No     Social History   Substance and Sexual Activity  Alcohol Use No     Social History   Tobacco Use  Smoking Status Former Smoker  . Packs/day: 1.00  . Years: 15.00  . Pack years: 15.00  . Quit date: 09/04/1998  . Years since quitting: 20.5  Smokeless Tobacco Never Used     Outpatient Encounter Medications as of 03/20/2019  Medication Sig  . Ascorbic Acid (VITAMIN C PO) Take by mouth.  . Cholecalciferol  (VITAMIN D3) 2000 units capsule Take 4,000 Units by mouth daily.  . Cyanocobalamin (B-12 PO) Take by mouth.  . Magnesium 100 MG TABS   . Multiple Vitamin (MULTIVITAMIN) tablet Take 1 tablet by mouth daily.  Marland Kitchen SYNTHROID 100 MCG tablet Take 1 tablet (100 mcg total) by mouth daily before breakfast.  . predniSONE (STERAPRED UNI-PAK 21 TAB) 10 MG (21) TBPK tablet Take per Pak instructions   No facility-administered encounter medications on file as of 03/20/2019.     Allergies: Shellfish allergy, Azithromycin, and Erythromycin  Body mass index is 34.89 kg/m.  Blood pressure 121/75, pulse 70, temperature 97.8 F (36.6 C), temperature source Oral, height 5' 5.5" (1.664 m), weight 212 lb 14.4 oz (96.6 kg), SpO2 99 %.  Review of Systems  Constitutional: Positive for fatigue. Negative for activity change, appetite change, chills, diaphoresis, fever and unexpected weight change.  HENT: Negative for congestion and postnasal drip.   Eyes: Negative for visual disturbance.  Respiratory: Negative for cough, chest tightness, shortness of breath, wheezing and stridor.   Cardiovascular: Negative for chest pain, palpitations and leg swelling.  Gastrointestinal: Negative for abdominal distention, abdominal pain, anal bleeding, blood in stool, constipation, diarrhea, nausea and vomiting.  Endocrine: Negative for cold intolerance, heat intolerance, polydipsia, polyphagia and polyuria.  Musculoskeletal: Negative for arthralgias, back pain, gait problem, joint swelling, myalgias, neck pain and neck stiffness.  Skin: Positive for color change and rash. Negative for pallor and wound.  Neurological: Negative for dizziness, weakness and headaches.       Objective:   Physical Exam Vitals signs and nursing note reviewed.  Constitutional:      General: She is not in acute distress.    Appearance: Normal appearance. She is obese. She is not ill-appearing, toxic-appearing or diaphoretic.  HENT:     Head:  Normocephalic and atraumatic.  Eyes:     Extraocular Movements: Extraocular movements intact.     Conjunctiva/sclera: Conjunctivae normal.     Pupils: Pupils are equal, round, and reactive to light.  Cardiovascular:     Rate and Rhythm: Normal rate and regular rhythm.     Pulses: Normal pulses.     Heart sounds: Normal heart sounds. No murmur. No friction rub. No gallop.   Pulmonary:     Effort: Pulmonary effort is normal. No respiratory distress.     Breath sounds: Normal breath sounds. No stridor. No wheezing, rhonchi or rales.  Chest:     Chest wall: No tenderness.  Skin:    Capillary Refill: Capillary refill takes less than 2 seconds.     Findings: Erythema and rash present. Rash is pustular.     Comments: Bil hands- pustular rash noted in between fingers at base  Neurological:  Mental Status: She is alert and oriented to person, place, and time.  Psychiatric:        Mood and Affect: Mood normal.        Behavior: Behavior normal.        Thought Content: Thought content normal.        Judgment: Judgment normal.        Assessment & Plan:   1. Irritant contact dermatitis due to plants, except food   2. Healthcare maintenance   3. Work related injury     Contact dermatitis Follow- up with your Human Resources department, re: Work Related Exposure. Once you have been evaluated by [facility], then start the Prednisone [facility]. Remain well hydrated.   Healthcare maintenance Continue to social distance and wear a mask when in public.  Work related injury Follow- up with your [employer] department, re: Work Related Exposure.     FOLLOW-UP:  Return if symptoms worsen or fail to improve.

## 2019-03-20 ENCOUNTER — Other Ambulatory Visit: Payer: Self-pay

## 2019-03-20 ENCOUNTER — Encounter: Payer: Self-pay | Admitting: Adult Health

## 2019-03-20 ENCOUNTER — Ambulatory Visit (INDEPENDENT_AMBULATORY_CARE_PROVIDER_SITE_OTHER): Payer: BC Managed Care – PPO | Admitting: Adult Health

## 2019-03-20 DIAGNOSIS — L247 Irritant contact dermatitis due to plants, except food: Secondary | ICD-10-CM

## 2019-03-20 DIAGNOSIS — Y990 Civilian activity done for income or pay: Secondary | ICD-10-CM | POA: Diagnosis not present

## 2019-03-20 DIAGNOSIS — L259 Unspecified contact dermatitis, unspecified cause: Secondary | ICD-10-CM | POA: Insufficient documentation

## 2019-03-20 DIAGNOSIS — Z0000 Encounter for general adult medical examination without abnormal findings: Secondary | ICD-10-CM

## 2019-03-20 MED ORDER — PREDNISONE 10 MG (21) PO TBPK: ORAL_TABLET | ORAL | 0 refills | Status: DC

## 2019-03-20 NOTE — Assessment & Plan Note (Signed)
Continue to social distance and wear a mask when in public 

## 2019-03-20 NOTE — Patient Instructions (Signed)
Contact Dermatitis Dermatitis is redness, soreness, and swelling (inflammation) of the skin. Contact dermatitis is a reaction to certain substances that touch the skin. Many different substances can cause contact dermatitis. There are two types of contact dermatitis:  Irritant contact dermatitis. This type is caused by something that irritates your skin, such as having dry hands from washing them too often with soap. This type does not require previous exposure to the substance for a reaction to occur. This is the most common type.  Allergic contact dermatitis. This type is caused by a substance that you are allergic to, such as poison ivy. This type occurs when you have been exposed to the substance (allergen) and develop a sensitivity to it. Dermatitis may develop soon after your first exposure to the allergen, or it may not develop until the next time you are exposed and every time thereafter. What are the causes? Irritant contact dermatitis is most commonly caused by exposure to:  Makeup.  Soaps.  Detergents.  Bleaches.  Acids.  Metal salts, such as nickel. Allergic contact dermatitis is most commonly caused by exposure to:  Poisonous plants.  Chemicals.  Jewelry.  Latex.  Medicines.  Preservatives in products, such as clothing. What increases the risk? You are more likely to develop this condition if you have:  A job that exposes you to irritants or allergens.  Certain medical conditions, such as asthma or eczema. What are the signs or symptoms? Symptoms of this condition may occur on your body anywhere the irritant has touched you or is touched by you.  Symptoms include: ? Dryness or flaking. ? Redness. ? Cracks. ? Itching. ? Pain or a burning feeling. ? Blisters. ? Drainage of small amounts of blood or clear fluid from skin cracks. With allergic contact dermatitis, there may also be swelling in areas such as the eyelids, mouth, or genitals. How is this  diagnosed? This condition is diagnosed with a medical history and physical exam.  A patch skin test may be performed to help determine the cause.  If the condition is related to your job, you may need to see an occupational medicine specialist. How is this treated? This condition is treated by checking for the cause of the reaction and protecting your skin from further contact. Treatment may also include:  Steroid creams or ointments. Oral steroid medicines may be needed in more severe cases.  Antibiotic medicines or antibacterial ointments, if a skin infection is present.  Antihistamine lotion or an antihistamine taken by mouth to ease itching.  A bandage (dressing). Follow these instructions at home: Skin care  Moisturize your skin as needed.  Apply cool compresses to the affected areas.  Try applying baking soda paste to your skin. Stir water into baking soda until it reaches a paste-like consistency.  Do not scratch your skin, and avoid friction to the affected area.  Avoid the use of soaps, perfumes, and dyes. Medicines  Take or apply over-the-counter and prescription medicines only as told by your health care provider.  If you were prescribed an antibiotic medicine, take or apply the antibiotic as told by your health care provider. Do not stop using the antibiotic even if your condition improves. Bathing  Try taking a bath with: ? Epsom salts. Follow the instructions on the packaging. You can get these at your local pharmacy or grocery store. ? Baking soda. Pour a small amount into the bath as directed by your health care provider. ? Colloidal oatmeal. Follow the instructions on the   packaging. You can get this at your local pharmacy or grocery store.  Bathe less frequently, such as every other day.  Bathe in lukewarm water. Avoid using hot water. Bandage care  If you were given a bandage (dressing), change it as told by your health care provider.  Wash your hands  with soap and water before and after you change your dressing. If soap and water are not available, use hand sanitizer. General instructions  Avoid the substance that caused your reaction. If you do not know what caused it, keep a journal to try to track what caused it. Write down: ? What you eat. ? What cosmetic products you use. ? What you drink. ? What you wear in the affected area. This includes jewelry.  More redness, swelling, or pain.  More fluid or blood.  Warmth.  Pus or a bad smell.  Keep all follow-up visits as told by your health care provider. This is important. Contact a health care provider if:  Your condition does not improve with treatment.  Your condition gets worse.  You have signs of infection such as swelling, tenderness, redness, soreness, or warmth in the affected area.  You have a fever.  You have new symptoms. Get help right away if:  You have a severe headache, neck pain, or neck stiffness.  You vomit.  You feel very sleepy.  You notice red streaks coming from the affected area.  Your bone or joint underneath the affected area becomes painful after the skin has healed.  The affected area turns darker.  You have difficulty breathing. Summary  Dermatitis is redness, soreness, and swelling (inflammation) of the skin. Contact dermatitis is a reaction to certain substances that touch the skin.  Symptoms of this condition may occur on your body anywhere the irritant has touched you or is touched by you.  This condition is treated by figuring out what caused the reaction and protecting your skin from further contact. Treatment may also include medicines and skin care.  Avoid the substance that caused your reaction. If you do not know what caused it, keep a journal to try to track what caused it.  Contact a health care provider if your condition gets worse or you have signs of infection such as swelling, tenderness, redness, soreness, or warmth  in the affected area. This information is not intended to replace advice given to you by your health care provider. Make sure you discuss any questions you have with your health care provider. Document Released: 08/18/2000 Document Revised: 12/11/2018 Document Reviewed: 03/06/2018 Elsevier Patient Education  2020 [employer].  Follow- up with your Human Resources department, re: Work Related Exposure. Once you have been evaluated by [facility], then start the Prednisone [facility]. Remain well hydrated. Continue to social distance and wear a mask when in public.

## 2019-03-20 NOTE — Assessment & Plan Note (Signed)
Follow- up with your Human Resources department, re: Work Related Exposure. Once you have been evaluated by [facility], then start the Prednisone [facility]. Remain well hydrated.

## 2019-03-20 NOTE — Assessment & Plan Note (Signed)
Follow- up with your Human Resources department, re: Work Related Exposure.

## 2019-03-23 ENCOUNTER — Encounter (HOSPITAL_COMMUNITY): Payer: Self-pay | Admitting: Emergency Medicine

## 2019-03-23 ENCOUNTER — Other Ambulatory Visit: Payer: Self-pay

## 2019-03-23 ENCOUNTER — Emergency Department (HOSPITAL_COMMUNITY)
Admission: EM | Admit: 2019-03-23 | Discharge: 2019-03-24 | Disposition: A | Discharge status: Home / Self Care | Payer: BC Managed Care – PPO | Attending: Emergency Medicine | Admitting: Emergency Medicine

## 2019-03-23 ENCOUNTER — Emergency Department (HOSPITAL_COMMUNITY): Payer: BC Managed Care – PPO

## 2019-03-23 DIAGNOSIS — R0789 Other chest pain: Secondary | ICD-10-CM | POA: Insufficient documentation

## 2019-03-23 DIAGNOSIS — E039 Hypothyroidism, unspecified: Secondary | ICD-10-CM | POA: Insufficient documentation

## 2019-03-23 DIAGNOSIS — Z87891 Personal history of nicotine dependence: Secondary | ICD-10-CM | POA: Diagnosis not present

## 2019-03-23 DIAGNOSIS — R079 Chest pain, unspecified: Secondary | ICD-10-CM | POA: Diagnosis not present

## 2019-03-23 DIAGNOSIS — Z79899 Other long term (current) drug therapy: Secondary | ICD-10-CM | POA: Insufficient documentation

## 2019-03-23 LAB — CBC
HCT: 38.4 % (ref 36.0–46.0)
Hemoglobin: 12.5 g/dL (ref 12.0–15.0)
MCH: 29.7 pg (ref 26.0–34.0)
MCHC: 32.6 g/dL (ref 30.0–36.0)
MCV: 91.2 fL (ref 80.0–100.0)
Platelets: 310 10*3/uL (ref 150–400)
RBC: 4.21 MIL/uL (ref 3.87–5.11)
RDW: 13.2 % (ref 11.5–15.5)
WBC: 7.5 10*3/uL (ref 4.0–10.5)
nRBC: 0 % (ref 0.0–0.2)

## 2019-03-23 LAB — BASIC METABOLIC PANEL
Anion gap: 9 (ref 5–15)
BUN: 14 mg/dL (ref 6–20)
CO2: 25 mmol/L (ref 22–32)
Calcium: 9.1 mg/dL (ref 8.9–10.3)
Chloride: 105 mmol/L (ref 98–111)
Creatinine, Ser: 0.79 mg/dL (ref 0.44–1.00)
GFR calc Af Amer: >60 mL/min (ref >60)
GFR calc non Af Amer: >60 mL/min (ref >60)
Glucose, Bld: 116 mg/dL — ABNORMAL HIGH (ref 70–99)
Potassium: 3.8 mmol/L (ref 3.5–5.1)
Sodium: 139 mmol/L (ref 135–145)

## 2019-03-23 LAB — TROPONIN I (HIGH SENSITIVITY): Troponin I (High Sensitivity): <2 ng/L (ref <18)

## 2019-03-23 MED ORDER — SODIUM CHLORIDE 0.9% FLUSH: 3.0000 mL | Freq: Once | INTRAVENOUS | Status: DC

## 2019-03-23 MED ORDER — ASPIRIN 81 MG PO CHEW
324.0000 mg | CHEWABLE_TABLET | Freq: Once | ORAL | Status: AC
  Administered 2019-03-23: 324 mg via ORAL
  Filled 2019-03-23: qty 4
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
## 2019-03-23 NOTE — ED Triage Notes (Signed)
Pt reports chest pain that started 30 min ago with radiation from center of chest to left chest and into left shoulder.

## 2019-03-23 NOTE — ED Notes (Addendum)
In the last 2 weeks, this is the third time pt has experienced cardiac symptoms.   --July 5 - She experienced chest pain 8/10 and lasted 1 hours, SOB, shoulder pain, neck pain, upset stomach.   --July 14 - She experienced similar symptoms but pain didn't last as long.  --Today - Pt experienced 40 minutes of sharp, stabbing, achy chest pain

## 2019-03-24 NOTE — ED Notes (Signed)
Pt said she is ready to go home now and will not wait much longer. She said her husband is waiting and she is tired.

## 2019-03-24 NOTE — Discharge Instructions (Signed)
Recommend to follow-up with your primary care doctor.   If you want to follow-up with cardiology then I have attached information for their clinic as well. Return to the ED for new or worsening symptoms.

## 2019-03-24 NOTE — ED Notes (Signed)
Relayed message from pt to provider.

## 2019-03-26 ENCOUNTER — Telehealth: Payer: Self-pay | Admitting: Adult Health

## 2019-03-26 NOTE — Telephone Encounter (Signed)
Patient is requesting call back from clinic staff to discuss some chest pains she has been having, she went to the ED a few days ago and they didn't do much for her other than send her a mail order COVID test, but she is wondering if it is more to it. Please advise

## 2019-03-26 NOTE — Telephone Encounter (Signed)
Pt was seen in the ED on 03/23/2019 for chest pain.  However, she states that she is having a lot of gas with lots of belching and flatulence.  Pt states that her employer is requiring a new COVID test and they are sending a testing kit to her home because she is having cough with sputum production, sneezing, and nasal congestion.  Pt states that she believes it is allergies only, but that her employer was requiring that she inform us.  Pt states that she is going to try OTC allergy medication  Charyl Bigger, CMA

## 2019-03-27 ENCOUNTER — Telehealth: Payer: Self-pay | Admitting: Obstetrics and Gynecology

## 2019-03-27 NOTE — Telephone Encounter (Signed)
Patient will call back to reschedule aex that was cancelled from 04/25/19.

## 2019-04-23 ENCOUNTER — Ambulatory Visit: Payer: BC Managed Care – PPO | Admitting: Physician Assistant

## 2019-04-25 ENCOUNTER — Ambulatory Visit: Payer: BLUE CROSS/BLUE SHIELD | Admitting: Obstetrics and Gynecology

## 2019-05-21 DIAGNOSIS — Z20828 Contact with and (suspected) exposure to other viral communicable diseases: Secondary | ICD-10-CM | POA: Diagnosis not present

## 2019-05-26 DIAGNOSIS — Z20828 Contact with and (suspected) exposure to other viral communicable diseases: Secondary | ICD-10-CM | POA: Diagnosis not present

## 2019-05-26 DIAGNOSIS — Z6833 Body mass index (BMI) 33.0-33.9, adult: Secondary | ICD-10-CM | POA: Diagnosis not present

## 2019-06-05 ENCOUNTER — Encounter: Payer: Self-pay | Admitting: Adult Health

## 2019-06-05 ENCOUNTER — Other Ambulatory Visit: Payer: Self-pay

## 2019-06-05 ENCOUNTER — Ambulatory Visit (INDEPENDENT_AMBULATORY_CARE_PROVIDER_SITE_OTHER): Payer: BC Managed Care – PPO | Admitting: Adult Health

## 2019-06-05 DIAGNOSIS — Z7689 Persons encountering health services in other specified circumstances: Secondary | ICD-10-CM | POA: Insufficient documentation

## 2019-06-05 DIAGNOSIS — R143 Flatulence: Secondary | ICD-10-CM

## 2019-06-05 DIAGNOSIS — R140 Abdominal distension (gaseous): Secondary | ICD-10-CM

## 2019-06-05 DIAGNOSIS — Z0489 Encounter for examination and observation for other specified reasons: Secondary | ICD-10-CM | POA: Diagnosis not present

## 2019-06-05 DIAGNOSIS — R6881 Early satiety: Secondary | ICD-10-CM | POA: Diagnosis not present

## 2019-06-05 DIAGNOSIS — Z20828 Contact with and (suspected) exposure to other viral communicable diseases: Secondary | ICD-10-CM

## 2019-06-05 NOTE — Assessment & Plan Note (Signed)
  Assessment and Plan: Remain quarantined until SAR-CoV-2 test results are posted. Avoid  Nachos with Chilli Cheese. Will draft Return to Work Letter with the following stipulations- She has had no known corona virus exposure >3 weeks. She reports 100% PPE compliance while at work and while in the community. Negative SARS-CoV-2 test from 06/03/2019 specimen Follow Up Instructions: PRN   I discussed the assessment and treatment plan with the patient. The patient was provided an opportunity to ask questions and all were answered. The patient agreed with the plan and demonstrated an understanding of the instructions.   The patient was advised to call back or seek an in-person evaluation if the symptoms worsen or if the condition fails to improve as anticipated.

## 2019-06-05 NOTE — Progress Notes (Signed)
Virtual Visit via Telephone Note  I connected with Carol Jenkins on 06/05/19 at  1:45 PM EDT by telephone and verified that I am speaking with the correct person using two identifiers.  Location: Patient: Home Provider: In Clinic   I discussed the limitations, risks, security and privacy concerns of performing an evaluation and management service by telephone and the availability of in person appointments. I also discussed with the patient that there may be a patient responsible charge related to this service. The patient expressed understanding and agreed to proceed.   History of Present Illness: Carol Jenkins calls in with request for "Letter to Return to Work". She was at work on Tuesday/06/03/2019-lunch was provided from [place] with [employer]. She ate the nachos and states "I never eat nachos with chilli but I was hungry".  She reports developing early satiety, bloating, increased gas while eating the meal and she was tested for SARS-CoV-2. Following day she she was still experiencing sx's. She denies any current sx's at present and she reports that her employer is requiring a letter from her PCP stating- "that the sx's are not r/t to [facility] Virus". Her SARS-CoV-2 test has yet to result- advised that her results are what should dictate if this is Corona virus r/t. She denies any virus exposure >3 weeks. She reports 100% PPE compliance while at work and while out in community.  She has established [facility]  Patient Care Team    Relationship Specialty Notifications Start End  Danford, Berna Spare, NP PCP - General Family Medicine  01/30/17     Patient Active Problem List   Diagnosis Date Noted  . Contact dermatitis 03/20/2019  . Work related injury 03/20/2019  . BMI 34.0-34.9,adult 03/13/2019  . Blood in stool 08/22/2018  . Elevated LDL cholesterol level 08/22/2018  . Chest tightness 02/26/2018  . Low libido 02/26/2018  . Nausea 11/21/2017  . S/P  hysterectomy with oophorectomy 2014- endometriosis 05/28/2017  . Status post colonoscopy with polypectomy 05/28/2017  . H/O mammogram- 7/17- N 05/28/2017  . Epigastric pain- chronic pain 05/28/2017  . Fatigue 05/28/2017  . Healthcare maintenance 01/30/2017  . Toe pain, bilateral 01/30/2017  . Tendonitis, Achilles, left 01/30/2017  . Hypothyroidism 01/30/2017  . Fibromyalgia 01/30/2017     Past Medical History:  Diagnosis Date  . Fatty liver 06/2016  . Fibromyalgia   . Gastric polyp   . Gastritis   . Headache syndrome    hemiparegic  . Hyperlipidemia   . Hypothyroidism   . Liver cyst   . Migraines   . Neuromuscular disorder (HCC)    Fibromyalgia  . Partial tear of Achilles tendon   . Thyroid disease   . Tubular adenoma of colon      Past Surgical History:  Procedure Laterality Date  . ABDOMINAL HYSTERECTOMY    . CRUCIATE LIGAMENT REPAIR    . CYSTOSCOPY    . DILATION AND CURETTAGE OF UTERUS    . NASAL SEPTUM SURGERY    . PARTIAL HYSTERECTOMY     with laparoscopy- completed hysterectomy (previously had left salpingo-oophorectomy)  . SALPINGOOPHORECTOMY Left   . TUBAL LIGATION       Family History  Problem Relation Age of Onset  . Alcohol abuse Mother   . Hyperlipidemia Mother   . Hypertension Mother   . Alcohol abuse Father   . Hyperlipidemia Father   . Hypertension Father   . Alcohol abuse Brother   . Diabetes Son   . Lung  cancer Maternal Uncle   . Heart attack Paternal Uncle   . Hypothyroidism Paternal Uncle   . Alcohol abuse Maternal Grandmother   . Hyperlipidemia Maternal Grandmother   . Hypertension Maternal Grandmother   . Alcohol abuse Maternal Grandfather   . Cancer Paternal Grandmother        abdominal  . Heart attack Paternal Grandfather   . Alcohol abuse Brother   . Stomach cancer Maternal Uncle   . Heart attack Paternal Uncle   . Hypothyroidism Paternal Uncle   . Heart attack Paternal Uncle   . Hypothyroidism Paternal Uncle       Social History   Substance and Sexual Activity  Drug Use No     Social History   Substance and Sexual Activity  Alcohol Use No     Social History   Tobacco Use  Smoking Status Former Smoker  . Packs/day: 1.00  . Years: 15.00  . Pack years: 15.00  . Quit date: 09/04/1998  . Years since quitting: 20.7  Smokeless Tobacco Never Used     Outpatient Encounter Medications as of 06/05/2019  Medication Sig  . Ascorbic Acid (VITAMIN C PO) Take by mouth.  . Cholecalciferol (VITAMIN D3) 2000 units capsule Take 4,000 Units by mouth daily.  . Cyanocobalamin (B-12 PO) Take by mouth.  . Magnesium 100 MG TABS   . Multiple Vitamin (MULTIVITAMIN) tablet Take 1 tablet by mouth daily.  Marland Kitchen SYNTHROID 100 MCG tablet Take 1 tablet (100 mcg total) by mouth daily before breakfast.  . [DISCONTINUED] predniSONE (STERAPRED UNI-PAK 21 TAB) 10 MG (21) TBPK tablet Take per Pak instructions   No facility-administered encounter medications on file as of 06/05/2019.     Allergies: Shellfish allergy, Azithromycin, and Erythromycin  There is no height or weight on file to calculate BMI.  There were no vitals taken for this visit. Review of Systems: General:   Denies fever, chills, unexplained weight loss.  Optho/Auditory:   Denies visual changes, blurred vision/LOV Respiratory:   Denies SOB, DOE more than baseline levels.  Cardiovascular:   Denies chest pain, palpitations, new onset peripheral edema  Gastrointestinal:   Denies nausea, vomiting, diarrhea.  Genitourinary: Denies dysuria, freq/ urgency, flank pain or discharge from genitals.  Endocrine:     Denies hot or cold intolerance, polyuria, polydipsia. Musculoskeletal:   Denies unexplained myalgias, joint swelling, unexplained arthralgias, gait problems.  Skin:  Denies rash, suspicious lesions Neurological:     Denies dizziness, unexplained weakness, numbness  Psychiatric/Behavioral:   Denies mood changes, suicidal or homicidal ideations,  hallucinations  Observations/Objective: No acute distress noted during the telephone conversation  Assessment and Plan: Remain quarantined until SAR-CoV-2 test results are posted. Avoid  Nachos with [employer]. Will draft Return to Work Letter with the following stipulations- She has had no known corona virus exposure >3 weeks. She reports 100% PPE compliance while at work and while in the community. Negative SARS-CoV-2 test from 06/03/2019 specimen Follow Up Instructions: PRN   I discussed the assessment and treatment plan with the patient. The patient was provided an opportunity to ask questions and all were answered. The patient agreed with the plan and demonstrated an understanding of the instructions.   The patient was advised to call back or seek an in-person evaluation if the symptoms worsen or if the condition fails to improve as anticipated.  I provided 18 minutes of non-face-to-face time during this encounter.   Esaw Grandchild, NP

## 2019-06-09 ENCOUNTER — Ambulatory Visit: Payer: BLUE CROSS/BLUE SHIELD | Admitting: Obstetrics and Gynecology

## 2019-06-09 ENCOUNTER — Ambulatory Visit: Payer: BC Managed Care – PPO | Admitting: Adult Health

## 2019-06-17 ENCOUNTER — Other Ambulatory Visit: Payer: Self-pay | Admitting: Adult Health

## 2019-06-17 DIAGNOSIS — Z1231 Encounter for screening mammogram for malignant neoplasm of breast: Secondary | ICD-10-CM

## 2019-07-24 ENCOUNTER — Telehealth: Payer: Self-pay | Admitting: Adult Health

## 2019-07-24 MED ORDER — SYNTHROID 100 MCG PO TABS: 100.0000 ug | ORAL_TABLET | Freq: Every day | ORAL | 1 refills | Status: DC

## 2019-07-24 NOTE — Telephone Encounter (Signed)
Received PA determination from CoverMyMeds.  Brand name Synthroid was denied.  Pt informed of denial and advised to call [employer] to inquire whether or not there are any other actions that she/we can take to get this medication covered for her since levothyroxine causes her pain, hair loss, fatigue and dry skin (per pt).  Pt expressed understanding.  Charyl Bigger, CMA

## 2019-07-24 NOTE — Telephone Encounter (Signed)
Advised pt that our office has not received a refill request or PA request for Synthroid.  Refill sent to pharmacy and PA submitted via [email or web address].  Will await PA determination.  Charyl Bigger, CMA

## 2019-07-24 NOTE — Addendum Note (Signed)
Addended by: Fonnie Mu on: 07/24/2019 05:33 PM   Modules accepted: Orders

## 2019-07-24 NOTE — Telephone Encounter (Signed)
Patient is checking status of her Synthroid med refill request. She states that we should have gotten a PA request for her to have name brand thyroid med and not generic. She is completely out and is hoping that we can have this refilled before the weekend. Please advise

## 2019-07-28 ENCOUNTER — Other Ambulatory Visit: Payer: Self-pay

## 2019-07-28 ENCOUNTER — Ambulatory Visit (INDEPENDENT_AMBULATORY_CARE_PROVIDER_SITE_OTHER): Payer: 59 | Admitting: Adult Health

## 2019-07-28 DIAGNOSIS — Z0000 Encounter for general adult medical examination without abnormal findings: Secondary | ICD-10-CM

## 2019-07-28 NOTE — Progress Notes (Signed)
APPT CANCELLED 

## 2019-08-08 ENCOUNTER — Other Ambulatory Visit: Payer: BC Managed Care – PPO

## 2019-08-12 ENCOUNTER — Encounter: Payer: BC Managed Care – PPO | Admitting: Adult Health

## 2019-08-12 ENCOUNTER — Ambulatory Visit: Payer: BLUE CROSS/BLUE SHIELD | Admitting: Obstetrics and Gynecology

## 2019-08-12 ENCOUNTER — Ambulatory Visit: Payer: BC Managed Care – PPO | Admitting: Adult Health

## 2019-09-15 ENCOUNTER — Other Ambulatory Visit: Payer: BC Managed Care – PPO

## 2019-09-22 ENCOUNTER — Encounter: Payer: BC Managed Care – PPO | Admitting: Adult Health

## 2019-10-18 ENCOUNTER — Other Ambulatory Visit: Payer: Self-pay | Admitting: Adult Health

## 2019-10-22 ENCOUNTER — Other Ambulatory Visit: Payer: BC Managed Care – PPO

## 2019-10-28 ENCOUNTER — Encounter: Payer: BC Managed Care – PPO | Admitting: Adult Health

## 2019-11-04 DIAGNOSIS — Z20828 Contact with and (suspected) exposure to other viral communicable diseases: Secondary | ICD-10-CM | POA: Diagnosis not present

## 2019-11-04 DIAGNOSIS — Z03818 Encounter for observation for suspected exposure to other biological agents ruled out: Secondary | ICD-10-CM | POA: Diagnosis not present

## 2019-11-13 ENCOUNTER — Telehealth: Payer: Self-pay

## 2019-11-13 ENCOUNTER — Other Ambulatory Visit: Payer: Self-pay | Admitting: Family Medicine

## 2019-11-13 NOTE — Telephone Encounter (Signed)
Please call pt to schedule appt.  No further refills until pt is seen.  T. Valia Wingard, CMA  

## 2019-12-02 ENCOUNTER — Other Ambulatory Visit: Payer: Self-pay | Admitting: Physician Assistant

## 2019-12-02 DIAGNOSIS — Z1231 Encounter for screening mammogram for malignant neoplasm of breast: Secondary | ICD-10-CM

## 2019-12-04 ENCOUNTER — Other Ambulatory Visit: Payer: Self-pay

## 2019-12-04 ENCOUNTER — Ambulatory Visit
Admission: RE | Admit: 2019-12-04 | Discharge: 2019-12-04 | Disposition: A | Discharge status: Home / Self Care | Payer: BC Managed Care – PPO | Source: Ambulatory Visit | Attending: Physician Assistant | Admitting: Physician Assistant

## 2019-12-04 DIAGNOSIS — L820 Inflamed seborrheic keratosis: Secondary | ICD-10-CM | POA: Diagnosis not present

## 2019-12-04 DIAGNOSIS — Z1231 Encounter for screening mammogram for malignant neoplasm of breast: Secondary | ICD-10-CM | POA: Diagnosis not present

## 2019-12-04 DIAGNOSIS — L821 Other seborrheic keratosis: Secondary | ICD-10-CM | POA: Diagnosis not present

## 2019-12-04 DIAGNOSIS — D225 Melanocytic nevi of trunk: Secondary | ICD-10-CM | POA: Diagnosis not present

## 2019-12-04 DIAGNOSIS — L738 Other specified follicular disorders: Secondary | ICD-10-CM | POA: Diagnosis not present

## 2019-12-04 DIAGNOSIS — R208 Other disturbances of skin sensation: Secondary | ICD-10-CM | POA: Diagnosis not present

## 2019-12-04 DIAGNOSIS — D485 Neoplasm of uncertain behavior of skin: Secondary | ICD-10-CM | POA: Diagnosis not present

## 2019-12-04 DIAGNOSIS — D1801 Hemangioma of skin and subcutaneous tissue: Secondary | ICD-10-CM | POA: Diagnosis not present

## 2019-12-04 DIAGNOSIS — L718 Other rosacea: Secondary | ICD-10-CM | POA: Diagnosis not present

## 2019-12-11 NOTE — Progress Notes (Deleted)
54 y.o. G69P2021 Married Caucasian female here for annual exam.    PCP:     No LMP recorded. Patient has had a hysterectomy.           Sexually active: {yes no:314532}  The current method of family planning is status post hysterectomy/LSO.    Exercising: {yes no:314532}  {types:19826} Smoker:  no  Health Maintenance: Pap: 2014 normal History of abnormal Pap:  no MMG: 12-04-19 3D/Neg/density B/BiRads1 Colonoscopy:  ***Neg IFOB 03-13-19 BMD:   n/a  Result  n/a TDaP:  *** Gardasil:   no HIV: Neg in preg Hep C: Neg per patient Screening Labs:  Hb today: ***, Urine today: ***   reports that she quit smoking about 21 years ago. She has a 15.00 pack-year smoking history. She has never used smokeless tobacco. She reports that she does not drink alcohol or use drugs.  Past Medical History:  Diagnosis Date  . Fatty liver 06/2016  . Fibromyalgia   . Gastric polyp   . Gastritis   . Headache syndrome    hemiparegic  . Hyperlipidemia   . Hypothyroidism   . Liver cyst   . Migraines   . Neuromuscular disorder (HCC)    Fibromyalgia  . Partial tear of Achilles tendon   . Thyroid disease   . Tubular adenoma of colon     Past Surgical History:  Procedure Laterality Date  . ABDOMINAL HYSTERECTOMY    . CRUCIATE LIGAMENT REPAIR    . CYSTOSCOPY    . DILATION AND CURETTAGE OF UTERUS    . NASAL SEPTUM SURGERY    . PARTIAL HYSTERECTOMY     with laparoscopy- completed hysterectomy (previously had left salpingo-oophorectomy)  . SALPINGOOPHORECTOMY Left   . TUBAL LIGATION      Current Outpatient Medications  Medication Sig Dispense Refill  . Ascorbic Acid (VITAMIN C PO) Take by mouth.    . Cholecalciferol (VITAMIN D3) 2000 units capsule Take 4,000 Units by mouth daily.    . Cyanocobalamin (B-12 PO) Take by mouth.    . Magnesium 100 MG TABS     . Multiple Vitamin (MULTIVITAMIN) tablet Take 1 tablet by mouth daily.    Marland Kitchen SYNTHROID 100 MCG tablet TAKE 1 TABLET BY MOUTH DAILY BEFORE  BREAKFAST. **PATIENT NEEDS APT FOR FURTHER REFILLS** 15 tablet 0   No current facility-administered medications for this visit.    Family History  Problem Relation Age of Onset  . Alcohol abuse Mother   . Hyperlipidemia Mother   . Hypertension Mother   . Alcohol abuse Father   . Hyperlipidemia Father   . Hypertension Father   . Alcohol abuse Brother   . Diabetes Son   . Lung cancer Maternal Uncle   . Heart attack Paternal Uncle   . Hypothyroidism Paternal Uncle   . Alcohol abuse Maternal Grandmother   . Hyperlipidemia Maternal Grandmother   . Hypertension Maternal Grandmother   . Alcohol abuse Maternal Grandfather   . Cancer Paternal Grandmother        abdominal  . Heart attack Paternal Grandfather   . Alcohol abuse Brother   . Stomach cancer Maternal Uncle   . Heart attack Paternal Uncle   . Hypothyroidism Paternal Uncle   . Heart attack Paternal Uncle   . Hypothyroidism Paternal Uncle     Review of Systems  Exam:   There were no vitals taken for this visit.    General appearance: alert, cooperative and appears stated age Head: normocephalic, without obvious abnormality, atraumatic  Neck: no adenopathy, supple, symmetrical, trachea midline and thyroid normal to inspection and palpation Lungs: clear to auscultation bilaterally Breasts: normal appearance, no masses or tenderness, No nipple retraction or dimpling, No nipple discharge or bleeding, No axillary adenopathy Heart: regular rate and rhythm Abdomen: soft, non-tender; no masses, no organomegaly Extremities: extremities normal, atraumatic, no cyanosis or edema Skin: skin color, texture, turgor normal. No rashes or lesions Lymph nodes: cervical, supraclavicular, and axillary nodes normal. Neurologic: grossly normal  Pelvic: External genitalia:  no lesions              No abnormal inguinal nodes palpated.              Urethra:  normal appearing urethra with no masses, tenderness or lesions              Bartholins  and Skenes: normal                 Vagina: normal appearing vagina with normal color and discharge, no lesions              Cervix: no lesions              Pap taken: {yes no:314532} Bimanual Exam:  Uterus:  normal size, contour, position, consistency, mobility, non-tender              Adnexa: no mass, fullness, tenderness              Rectal exam: {yes no:314532}.  Confirms.              Anus:  normal sphincter tone, no lesions  Chaperone was present for exam.  Assessment:   Well woman visit with normal exam.   Plan: Mammogram screening discussed. Self breast awareness reviewed. Pap and HR HPV as above. Guidelines for Calcium, Vitamin D, regular exercise program including cardiovascular and weight bearing exercise.   Follow up annually and prn.   Additional counseling given.  {yes [id number]. _______ minutes face to face time of which over 50% was spent in counseling.    After visit summary provided.

## 2019-12-12 ENCOUNTER — Other Ambulatory Visit: Payer: Self-pay

## 2019-12-15 ENCOUNTER — Other Ambulatory Visit: Payer: Self-pay | Admitting: Family Medicine

## 2019-12-15 ENCOUNTER — Ambulatory Visit: Payer: BC Managed Care – PPO | Admitting: Obstetrics and Gynecology

## 2019-12-15 ENCOUNTER — Telehealth: Payer: Self-pay

## 2019-12-15 NOTE — Telephone Encounter (Signed)
Left message for patient to call to reschedule AEX.

## 2019-12-15 NOTE — Telephone Encounter (Signed)
-----   Message from Huey Romans, RN sent at 12/14/2019  4:30 PM EDT ----- Regarding: silva reschedule Annual will need to be rescheduled

## 2019-12-29 NOTE — Telephone Encounter (Signed)
Patient scheduled for AEX 01-08-20.

## 2020-01-01 ENCOUNTER — Ambulatory Visit: Payer: BLUE CROSS/BLUE SHIELD | Admitting: Obstetrics and Gynecology

## 2020-01-07 ENCOUNTER — Ambulatory Visit: Payer: BC Managed Care – PPO | Admitting: Obstetrics and Gynecology

## 2020-01-08 ENCOUNTER — Ambulatory Visit (INDEPENDENT_AMBULATORY_CARE_PROVIDER_SITE_OTHER): Payer: BC Managed Care – PPO | Admitting: Physician Assistant

## 2020-01-08 ENCOUNTER — Encounter: Payer: Self-pay | Admitting: Obstetrics and Gynecology

## 2020-01-08 ENCOUNTER — Encounter: Payer: Self-pay | Admitting: Physician Assistant

## 2020-01-08 ENCOUNTER — Ambulatory Visit (INDEPENDENT_AMBULATORY_CARE_PROVIDER_SITE_OTHER): Payer: BC Managed Care – PPO | Admitting: Obstetrics and Gynecology

## 2020-01-08 ENCOUNTER — Other Ambulatory Visit: Payer: Self-pay

## 2020-01-08 VITALS — BP 118/68 | HR 80 | Temp 98.4°F | Ht 66.75 in | Wt 218.0 lb

## 2020-01-08 VITALS — BP 97/67 | HR 80 | Temp 97.6°F | Ht 65.75 in | Wt 219.8 lb

## 2020-01-08 DIAGNOSIS — E039 Hypothyroidism, unspecified: Secondary | ICD-10-CM | POA: Diagnosis not present

## 2020-01-08 DIAGNOSIS — N898 Other specified noninflammatory disorders of vagina: Secondary | ICD-10-CM

## 2020-01-08 DIAGNOSIS — E7800 Pure hypercholesterolemia, unspecified: Secondary | ICD-10-CM | POA: Diagnosis not present

## 2020-01-08 DIAGNOSIS — R8290 Unspecified abnormal findings in urine: Secondary | ICD-10-CM | POA: Diagnosis not present

## 2020-01-08 DIAGNOSIS — Z01419 Encounter for gynecological examination (general) (routine) without abnormal findings: Secondary | ICD-10-CM | POA: Diagnosis not present

## 2020-01-08 DIAGNOSIS — Z0000 Encounter for general adult medical examination without abnormal findings: Secondary | ICD-10-CM

## 2020-01-08 LAB — POCT URINALYSIS DIPSTICK
Bilirubin, UA: NEGATIVE
Glucose, UA: NEGATIVE
Ketones, UA: NEGATIVE
Leukocytes, UA: NEGATIVE
Nitrite, UA: NEGATIVE
Protein, UA: NEGATIVE
Urobilinogen, UA: 0.2 E.U./dL
pH, UA: 5 (ref 5.0–8.0)

## 2020-01-08 MED ORDER — LEVOTHYROXINE SODIUM 50 MCG PO TABS: 50.0000 ug | ORAL_TABLET | Freq: Two times a day (BID) | ORAL | 1 refills | Status: DC

## 2020-01-08 NOTE — Progress Notes (Signed)
Female Physical   Impression and Recommendations:    1. Encounter for general adult medical examination w/o abnormal findings   2. Hypothyroidism, unspecified type   3. Elevated LDL cholesterol level      1) Anticipatory Guidance: Discussed skin CA prevention and sunscreen when outside along with skin surveillance; eating a balanced and modest diet; physical activity at least 25 minutes per day or minimum of 150 min/ week moderate to intense activity.  2) Immunizations / Screenings / Labs:   All immunizations are up-to-date per recommendations or will be updated today if pt allows.    - Patient understands with dental and vision screens they will schedule independently.  - Will obtain CBC, CMP, HgA1c, Lipid panel, and TSH when fasting, if not already done past 12 mo/ recently  - Pt declined Zoster vaccine. Pt recently received second dose of Moderna and experienced severe side effects so does not want any vaccines at this time.  - Pt declined HIV screening. - Pt reports she has received Tdap within 10 year mark and we will try to track down.  3) Weight:  BMI meaning discussed with patient.  Discussed goal to improve diet habits to improve overall feelings of well being and objective health data. Improve nutrient density of diet through increasing intake of fruits and vegetables and decreasing saturated fats, white flour products and refined sugars. - Pt motivated to restart healthy dietary habits and exercise routine to loose weight.  4) Health Maintenance, Hypothyroid:  -  Pt reports hypothyroid was managed by endocrinologist when she lived in [place] and had to take brand name of levothyroxine- Synthroid and her dose was adjusted to 50 mcg BID because it worked better to control symptoms than 100 mcg once daily in am. States she has been on this dose for the past 7 years. Pt requested referral to endocrinology for continued management and will place order.   Meds ordered this  encounter  Medications  . levothyroxine (SYNTHROID) 50 MCG tablet    Sig: Take 1 tablet (50 mcg total) by mouth in the morning and at bedtime.    Dispense:  90 tablet    Refill:  1    Please fill BRAND name (Synthroid)    Order Specific Question:   Supervising Provider    Answer:   Beatrice Lecher D [[id number]]    Orders Placed This Encounter  Procedures  . CBC  . Comprehensive metabolic panel  . Hemoglobin A1c  . Lipid panel  . TSH  . Ambulatory referral to Endocrinology     Return in about 6 months (around 07/10/2020) for HLD, Hypothyroid.   Reminded pt important of f-up preventative CPE in 1 year, this is in addition to any chronic care visits.    Gross side effects, risk and benefits, and alternatives of medications discussed with patient.  Patient is aware that all medications have potential side effects and we are unable to predict every side effect or drug-drug interaction that may occur.  Expresses verbal understanding and consents to current therapy plan and treatment regimen.    Please see orders placed and AVS handed out to patient at the end of our visit for further patient instructions/ counseling done pertaining to today's office visit.    Subjective:     CPE HPI: Carol Jenkins is a 54 y.o. female who presents to [place] [facility] today for a yearly health maintenance exam.   Health Maintenance Summary  - Reviewed and updated,  unless pt declines services.  Last Cologuard or Colonoscopy:  01/27/2016 Family history of Colon CA:  No  Tobacco History Reviewed:  Y, former smoker CT scan for screening lung CA:  N/A Alcohol and/or drug use: No concerns; no use Dental Home: In the process of finding a new dentist Eye exams: Yes Dermatology home: Yes  Female Health:  PAP Smear - last known results: s/p hysterectomy STD concerns:   none, monogamous Lumps or breast concerns:  None Breast Cancer Family History: No Bone/ DEXA scan:   Unnecessary due to < 65 and average risk   Additional concerns beyond health maintenance issues:  None   Immunization History  Administered Date(s) Administered  . Influenza,inj,Quad PF,6+ Mos 08/23/2018     Health Maintenance  Topic Date Due  . COVID-19 Vaccine (1) Never done  . PAP SMEAR-Modifier  Never done  . INFLUENZA VACCINE  04/04/2020  . MAMMOGRAM  12/03/2021  . COLONOSCOPY  01/26/2026  . Hepatitis C Screening  Completed  . HIV Screening  Completed  . TETANUS/TDAP  Discontinued     Wt Readings from Last 3 Encounters:  01/08/20 219 lb 12.8 oz (99.7 kg)  03/23/19 206 lb (93.4 kg)  03/20/19 212 lb 14.4 oz (96.6 kg)   BP Readings from Last 3 Encounters:  01/08/20 97/67  03/24/19 (!) 114/94  03/20/19 121/75   Pulse Readings from Last 3 Encounters:  01/08/20 80  03/24/19 86  03/20/19 70     Past Medical History:  Diagnosis Date  . Fatty liver 06/2016  . Fibromyalgia   . Gastric polyp   . Gastritis   . Headache syndrome    hemiparegic  . Hyperlipidemia   . Hypothyroidism   . Liver cyst   . Migraines   . Neuromuscular disorder (HCC)    Fibromyalgia  . Partial tear of Achilles tendon   . Thyroid disease   . Tubular adenoma of colon       Past Surgical History:  Procedure Laterality Date  . ABDOMINAL HYSTERECTOMY    . CRUCIATE LIGAMENT REPAIR    . CYSTOSCOPY    . DILATION AND CURETTAGE OF UTERUS    . NASAL SEPTUM SURGERY    . PARTIAL HYSTERECTOMY     with laparoscopy- completed hysterectomy (previously had left salpingo-oophorectomy)  . SALPINGOOPHORECTOMY Left   . TUBAL LIGATION        Family History  Problem Relation Age of Onset  . Alcohol abuse Mother   . Hyperlipidemia Mother   . Hypertension Mother   . Alcohol abuse Father   . Hyperlipidemia Father   . Hypertension Father   . Alcohol abuse Brother   . Diabetes Son   . Lung cancer Maternal Uncle   . Heart attack Paternal Uncle   . Hypothyroidism Paternal Uncle   . Alcohol  abuse Maternal Grandmother   . Hyperlipidemia Maternal Grandmother   . Hypertension Maternal Grandmother   . Alcohol abuse Maternal Grandfather   . Cancer Paternal Grandmother        abdominal  . Heart attack Paternal Grandfather   . Alcohol abuse Brother   . Stomach cancer Maternal Uncle   . Heart attack Paternal Uncle   . Hypothyroidism Paternal Uncle   . Heart attack Paternal Uncle   . Hypothyroidism Paternal Uncle       Social History   Substance and Sexual Activity  Drug Use No  ,   Social History   Substance and Sexual Activity  Alcohol Use No  ,  Social History   Tobacco Use  Smoking Status Former Smoker  . Packs/day: 1.00  . Years: 15.00  . Pack years: 15.00  . Quit date: 09/04/1998  . Years since quitting: 21.3  Smokeless Tobacco Never Used  ,   Social History   Substance and Sexual Activity  Sexual Activity Not Currently  . Birth control/protection: Surgical    Current Outpatient Medications on File Prior to Visit  Medication Sig Dispense Refill  . Ascorbic Acid (VITAMIN C PO) Take by mouth.    . Cholecalciferol (VITAMIN D3) 2000 units capsule Take 4,000 Units by mouth daily.    . Cyanocobalamin (B-12 PO) Take by mouth.    . Magnesium 100 MG TABS     . Multiple Vitamin (MULTIVITAMIN) tablet Take 1 tablet by mouth daily.     No current facility-administered medications on file prior to visit.    Allergies: Shellfish allergy, Azithromycin, and Erythromycin  Review of Systems: General: Denies fever, chills, unexplained weight loss.  Optho/Auditory: Denies visual changes, blurred vision/LOV Respiratory: Denies SOB, DOE more than baseline levels, cough.   Cardiovascular:Denies chest pain, palpitations, new onset peripheral edema  Gastrointestinal: Denies nausea, vomiting, diarrhea.  Genitourinary: Denies dysuria, freq/ urgency, flank pain Endocrine: Denies hot or cold intolerance, polyuria, polydipsia. Musculoskeletal: Denies unexplained  myalgias, joint swelling, unexplained arthralgias, gait problems.  Skin: Denies rash, suspicious lesions Neurological: Denies dizziness, unexplained weakness, numbness  Psychiatric/Behavioral: Denies mood changes, suicidal or homicidal ideations, hallucinations    Objective:    Blood pressure 97/67, pulse 80, temperature 97.6 F (36.4 C), temperature source Oral, height 5' 5.75" (1.67 m), weight 219 lb 12.8 oz (99.7 kg), SpO2 96 %. Body mass index is 35.75 kg/m. General Appearance:    Alert, cooperative, no distress, appears stated age  Head:    Normocephalic, without obvious abnormality, atraumatic  Eyes:    PERRL, conjunctiva/corneas clear, EOM's intact, fundi    benign, both eyes  Ears:    Normal TM's with minimal fluid noted in R middle ear. Normal external ear canals, both ears.  Nose:   Nares normal, septum midline, mucosa normal, no drainage    or sinus tenderness  Throat:   Lips w/o lesion, mucosa moist, and tongue normal; teeth and   gums normal  Neck:   Supple, symmetrical, trachea midline, no adenopathy;    thyroid:  no enlargement/tenderness/nodules; no carotid   bruit or JVD  Back:     Symmetric, no curvature, ROM normal, no CVA tenderness  Lungs:     Clear to auscultation bilaterally, respirations unlabored, no       Wh/ R/ R  Chest Wall:    No tenderness or gross deformity; normal excursion   Heart:    Regular rate and rhythm, S1 and S2 normal, no murmur, rub   or gallop  Breast Exam:    Deferred to Ob-Gyn.  Abdomen:     Soft, non-tender, bowel sounds active all four quadrants, NO   G/R/R, no masses, no organomegaly  Genitalia:   Deferred to Ob-Gyn.  Rectal:   Deferred.  Extremities:   Extremities normal, atraumatic, no cyanosis. Edema and tenderness noted.  Pulses:   2+ and symmetric all extremities  Skin:   Warm, dry, Skin color, texture, turgor normal, no obvious rashes or lesions Psych: No HI/SI, judgement and insight good, Euthymic mood. Full Affect.   Neurologic:   CNII-XII intact, normal strength, sensation and reflexes    Throughout

## 2020-01-08 NOTE — Patient Instructions (Signed)
Preventive Care for Adults, Female  A healthy lifestyle and preventive care can promote health and wellness. Preventive health guidelines for women include the following key practices.   A routine yearly physical is a good way to check with your health care provider about your health and preventive screening. It is a chance to share any concerns and updates on your health and to receive a thorough exam.   Visit your dentist for a routine exam and preventive care every 6 months. Brush your teeth twice a day and floss once a day. Good oral hygiene prevents tooth decay and gum disease.   The frequency of eye exams is based on your age, health, family medical history, use of contact lenses, and other factors. Follow your health care provider's recommendations for frequency of eye exams.   Eat a healthy diet. Foods like vegetables, fruits, whole grains, low-fat dairy products, and lean protein foods contain the nutrients you need without too many calories. Decrease your intake of foods high in solid fats, added sugars, and salt. Eat the right amount of calories for you.Get information about a proper diet from your health care provider, if necessary.   Regular physical exercise is one of the most important things you can do for your health. Most adults should get at least 150 minutes of moderate-intensity exercise (any activity that increases your heart rate and causes you to sweat) each week. In addition, most adults need muscle-strengthening exercises on 2 or more days a week.   Maintain a healthy weight. The body mass index (BMI) is a screening tool to identify possible weight problems. It provides an estimate of body fat based on height and weight. Your health care provider can find your BMI, and can help you achieve or maintain a healthy weight.For adults 20 years and older:   - A BMI below 18.5 is considered underweight.   - A BMI of 18.5 to 24.9 is normal.   - A BMI of 25 to 29.9 is  considered overweight.   - A BMI of 30 and above is considered obese.   Maintain normal blood lipids and cholesterol levels by exercising and minimizing your intake of trans and saturated fats.  Eat a balanced diet with plenty of fruit and vegetables. Blood tests for lipids and cholesterol should begin at age 20 and be repeated every 5 years minimum.  If your lipid or cholesterol levels are high, you are over 40, or you are at high risk for heart disease, you may need your cholesterol levels checked more frequently.Ongoing high lipid and cholesterol levels should be treated with medicines if diet and exercise are not working.   If you smoke, find out from your health care provider how to quit. If you do not use tobacco, do not start.   Lung cancer screening is recommended for adults aged 55-80 years who are at high risk for developing lung cancer because of a history of smoking. A yearly low-dose CT scan of the lungs is recommended for people who have at least a 30-pack-year history of smoking and are a current smoker or have quit within the past 15 years. A pack year of smoking is smoking an average of 1 pack of cigarettes a day for 1 year (for example: 1 pack a day for 30 years or 2 packs a day for 15 years). Yearly screening should continue until the smoker has stopped smoking for at least 15 years. Yearly screening should be stopped for people who develop a   health problem that would prevent them from having lung cancer treatment.   If you are pregnant, do not drink alcohol. If you are breastfeeding, be very cautious about drinking alcohol. If you are not pregnant and choose to drink alcohol, do not have more than 1 drink per day. One drink is considered to be 12 ounces (355 mL) of beer, 5 ounces (148 mL) of wine, or 1.5 ounces (44 mL) of liquor.   Avoid use of street drugs. Do not share needles with anyone. Ask for help if you need support or instructions about stopping the use of  drugs.   High blood pressure causes heart disease and increases the risk of stroke. Your blood pressure should be checked at least yearly.  Ongoing high blood pressure should be treated with medicines if weight loss and exercise do not work.   If you are 69-55 years old, ask your health care provider if you should take aspirin to prevent strokes.   Diabetes screening involves taking a blood sample to check your fasting blood sugar level. This should be done once every 3 years, after age 38, if you are within normal weight and without risk factors for diabetes. Testing should be considered at a younger age or be carried out more frequently if you are overweight and have at least 1 risk factor for diabetes.   Breast cancer screening is essential preventive care for women. You should practice "breast self-awareness."  This means understanding the normal appearance and feel of your breasts and may include breast self-examination.  Any changes detected, no matter how small, should be reported to a health care provider.  Women in their 80s and 30s should have a clinical breast exam (CBE) by a health care provider as part of a regular health exam every 1 to 3 years.  After age 66, women should have a CBE every year.  Starting at age 1, women should consider having a mammogram (breast X-ray test) every year.  Women who have a family history of breast cancer should talk to their health care provider about genetic screening.  Women at a high risk of breast cancer should talk to their health care providers about having an MRI and a mammogram every year.   -Breast cancer gene (BRCA)-related cancer risk assessment is recommended for women who have family members with BRCA-related cancers. BRCA-related cancers include breast, ovarian, tubal, and peritoneal cancers. Having family members with these cancers may be associated with an increased risk for harmful changes (mutations) in the breast cancer genes BRCA1 and  BRCA2. Results of the assessment will determine the need for genetic counseling and BRCA1 and BRCA2 testing.   The Pap test is a screening test for cervical cancer. A Pap test can show cell changes on the cervix that might become cervical cancer if left untreated. A Pap test is a procedure in which cells are obtained and examined from the lower end of the uterus (cervix).   - Women should have a Pap test starting at age 57.   - Between ages 90 and 70, Pap tests should be repeated every 2 years.   - Beginning at age 63, you should have a Pap test every 3 years as long as the past 3 Pap tests have been normal.   - Some women have medical problems that increase the chance of getting cervical cancer. Talk to your health care provider about these problems. It is especially important to talk to your health care provider if a  new problem develops soon after your last Pap test. In these cases, your health care provider may recommend more frequent screening and Pap tests.   - The above recommendations are the same for women who have or have not gotten the vaccine for human papillomavirus (HPV).   - If you had a hysterectomy for a problem that was not cancer or a condition that could lead to cancer, then you no longer need Pap tests. Even if you no longer need a Pap test, a regular exam is a good idea to make sure no other problems are starting.   - If you are between ages 36 and 66 years, and you have had normal Pap tests going back 10 years, you no longer need Pap tests. Even if you no longer need a Pap test, a regular exam is a good idea to make sure no other problems are starting.   - If you have had past treatment for cervical cancer or a condition that could lead to cancer, you need Pap tests and screening for cancer for at least 20 years after your treatment.   - If Pap tests have been discontinued, risk factors (such as a new sexual partner) need to be reassessed to determine if screening should  be resumed.   - The HPV test is an additional test that may be used for cervical cancer screening. The HPV test looks for the virus that can cause the cell changes on the cervix. The cells collected during the Pap test can be tested for HPV. The HPV test could be used to screen women aged 70 years and older, and should be used in women of any age who have unclear Pap test results. After the age of 67, women should have HPV testing at the same frequency as a Pap test.   Colorectal cancer can be detected and often prevented. Most routine colorectal cancer screening begins at the age of 57 years and continues through age 26 years. However, your health care provider may recommend screening at an earlier age if you have risk factors for colon cancer. On a yearly basis, your health care provider may provide home test kits to check for hidden blood in the stool.  Use of a small camera at the end of a tube, to directly examine the colon (sigmoidoscopy or colonoscopy), can detect the earliest forms of colorectal cancer. Talk to your health care provider about this at age 23, when routine screening begins. Direct exam of the colon should be repeated every 5 -10 years through age 49 years, unless early forms of pre-cancerous polyps or small growths are found.   People who are at an increased risk for hepatitis B should be screened for this virus. You are considered at high risk for hepatitis B if:  -You were born in a country where hepatitis B occurs often. Talk with your health care provider about which countries are considered high risk.  - Your parents were born in a high-risk country and you have not received a shot to protect against hepatitis B (hepatitis B vaccine).  - You have HIV or AIDS.  - You use needles to inject street drugs.  - You live with, or have sex with, someone who has Hepatitis B.  - You get hemodialysis treatment.  - You take certain medicines for conditions like cancer, organ  transplantation, and autoimmune conditions.   Hepatitis C blood testing is recommended for all people born from [birth year] through 1965 and any individual  with known risks for hepatitis C.   Practice safe sex. Use condoms and avoid high-risk sexual practices to reduce the spread of sexually transmitted infections (STIs). STIs include gonorrhea, chlamydia, syphilis, trichomonas, herpes, HPV, and human immunodeficiency virus (HIV). Herpes, HIV, and HPV are viral illnesses that have no cure. They can result in disability, cancer, and death. Sexually active women aged 25 years and younger should be checked for chlamydia. Older women with new or multiple partners should also be tested for chlamydia. Testing for other STIs is recommended if you are sexually active and at increased risk.   Osteoporosis is a disease in which the bones lose minerals and strength with aging. This can result in serious bone fractures or breaks. The risk of osteoporosis can be identified using a bone density scan. Women ages 65 years and over and women at risk for fractures or osteoporosis should discuss screening with their health care providers. Ask your health care provider whether you should take a calcium supplement or vitamin D to There are also several preventive steps women can take to avoid osteoporosis and resulting fractures or to keep osteoporosis from worsening. -->Recommendations include:  Eat a balanced diet high in fruits, vegetables, calcium, and vitamins.  Get enough calcium. The recommended total intake of is 1,200 mg daily; for best absorption, if taking supplements, divide doses into 250-500 mg doses throughout the day. Of the two types of calcium, calcium carbonate is best absorbed when taken with food but calcium citrate can be taken on an empty stomach.  Get enough vitamin D. NAMS and the National Osteoporosis Foundation recommend at least 1,000 IU per day for women age 50 and over who are at risk of vitamin D  deficiency. Vitamin D deficiency can be caused by inadequate sun exposure (for example, those who live in northern latitudes).  Avoid alcohol and smoking. Heavy alcohol intake (more than 7 drinks per week) increases the risk of falls and hip fracture and women smokers tend to lose bone more rapidly and have lower bone mass than nonsmokers. Stopping smoking is one of the most important changes women can make to improve their health and decrease risk for disease.  Be physically active every day. Weight-bearing exercise (for example, fast walking, hiking, jogging, and weight training) may strengthen bones or slow the rate of bone loss that comes with aging. Balancing and muscle-strengthening exercises can reduce the risk of falling and fracture.  Consider therapeutic medications. Currently, several types of effective drugs are available. Healthcare providers can recommend the type most appropriate for each woman.  Eliminate environmental factors that may contribute to accidents. Falls cause nearly 90% of all osteoporotic fractures, so reducing this risk is an important bone-health strategy. Measures include ample lighting, removing obstructions to walking, using nonskid rugs on floors, and placing mats and/or grab bars in showers.  Be aware of medication side effects. Some common medicines make bones weaker. These include a type of steroid drug called glucocorticoids used for arthritis and asthma, some antiseizure drugs, certain sleeping pills, treatments for endometriosis, and some cancer drugs. An overactive thyroid gland or using too much thyroid hormone for an underactive thyroid can also be a problem. If you are taking these medicines, talk to your doctor about what you can do to help protect your bones.reduce the rate of osteoporosis.    Menopause can be associated with physical symptoms and risks. Hormone replacement therapy is available to decrease symptoms and risks. You should talk to your  health care provider   about whether hormone replacement therapy is right for you.   Use sunscreen. Apply sunscreen liberally and repeatedly throughout the day. You should seek shade when your shadow is shorter than you. Protect yourself by wearing long sleeves, pants, a wide-brimmed hat, and sunglasses year round, whenever you are outdoors.   Once a month, do a whole body skin exam, using a mirror to look at the skin on your back. Tell your health care provider of new moles, moles that have irregular borders, moles that are larger than a pencil eraser, or moles that have changed in shape or color.   -Stay current with required vaccines (immunizations).   Influenza vaccine. All adults should be immunized every year.  Tetanus, diphtheria, and acellular pertussis (Td, Tdap) vaccine. Pregnant women should receive 1 dose of Tdap vaccine during each pregnancy. The dose should be obtained regardless of the length of time since the last dose. Immunization is preferred during the 27th 36th week of gestation. An adult who has not previously received Tdap or who does not know her vaccine status should receive 1 dose of Tdap. This initial dose should be followed by tetanus and diphtheria toxoids (Td) booster doses every 10 years. Adults with an unknown or incomplete history of completing a 3-dose immunization series with Td-containing vaccines should begin or complete a primary immunization series including a Tdap dose. Adults should receive a Td booster every 10 years.  Varicella vaccine. An adult without evidence of immunity to varicella should receive 2 doses or a second dose if she has previously received 1 dose. Pregnant females who do not have evidence of immunity should receive the first dose after pregnancy. This first dose should be obtained before leaving the health care facility. The second dose should be obtained 4 8 weeks after the first dose.  Human papillomavirus (HPV) vaccine. Females aged 13 26  years who have not received the vaccine previously should obtain the 3-dose series. The vaccine is not recommended for use in pregnant females. However, pregnancy testing is not needed before receiving a dose. If a female is found to be pregnant after receiving a dose, no treatment is needed. In that case, the remaining doses should be delayed until after the pregnancy. Immunization is recommended for any person with an immunocompromised condition through the age of 26 years if she did not get any or all doses earlier. During the 3-dose series, the second dose should be obtained 4 8 weeks after the first dose. The third dose should be obtained 24 weeks after the first dose and 16 weeks after the second dose.  Zoster vaccine. One dose is recommended for adults aged 60 years or older unless certain conditions are present.  Measles, mumps, and rubella (MMR) vaccine. Adults born before 1957 generally are considered immune to measles and mumps. Adults born in 1957 or later should have 1 or more doses of MMR vaccine unless there is a contraindication to the vaccine or there is laboratory evidence of immunity to each of the three diseases. A routine second dose of MMR vaccine should be obtained at least 28 days after the first dose for students attending postsecondary schools, health care workers, or international travelers. People who received inactivated measles vaccine or an unknown type of measles vaccine during 1963 1967 should receive 2 doses of MMR vaccine. People who received inactivated mumps vaccine or an unknown type of mumps vaccine before 1979 and are at high risk for mumps infection should consider immunization with 2 doses of   MMR vaccine. For females of childbearing age, rubella immunity should be determined. If there is no evidence of immunity, females who are not pregnant should be vaccinated. If there is no evidence of immunity, females who are pregnant should delay immunization until after pregnancy.  Unvaccinated health care workers born before [birth year] who lack laboratory evidence of measles, mumps, or rubella immunity or laboratory confirmation of disease should consider measles and mumps immunization with 2 doses of MMR vaccine or rubella immunization with 1 dose of MMR vaccine.  Pneumococcal 13-valent conjugate (PCV13) vaccine. When indicated, a person who is uncertain of her immunization history and has no record of immunization should receive the PCV13 vaccine. An adult aged 54 years or older who has certain medical conditions and has not been previously immunized should receive 1 dose of PCV13 vaccine. This PCV13 should be followed with a dose of pneumococcal polysaccharide (PPSV23) vaccine. The PPSV23 vaccine dose should be obtained at least 8 weeks after the dose of PCV13 vaccine. An adult aged 58 years or older who has certain medical conditions and previously received 1 or more doses of PPSV23 vaccine should receive 1 dose of PCV13. The PCV13 vaccine dose should be obtained 1 or more years after the last PPSV23 vaccine dose.  Pneumococcal polysaccharide (PPSV23) vaccine. When PCV13 is also indicated, PCV13 should be obtained first. All adults aged 58 years and older should be immunized. An adult younger than age 65 years who has certain medical conditions should be immunized. Any person who resides in a nursing home or long-term care facility should be immunized. An adult smoker should be immunized. People with an immunocompromised condition and certain other conditions should receive both PCV13 and PPSV23 vaccines. People with human immunodeficiency virus (HIV) infection should be immunized as soon as possible after diagnosis. Immunization during chemotherapy or radiation therapy should be avoided. Routine use of PPSV23 vaccine is not recommended for American Indians, [place] Natives, or people younger than 65 years unless there are medical conditions that require PPSV23 vaccine. When indicated,  people who have unknown immunization and have no record of immunization should receive PPSV23 vaccine. One-time revaccination 5 years after the first dose of PPSV23 is recommended for people aged 70 64 years who have chronic kidney failure, nephrotic syndrome, asplenia, or immunocompromised conditions. People who received 1 2 doses of PPSV23 before age 32 years should receive another dose of PPSV23 vaccine at age 96 years or later if at least 5 years have passed since the previous dose. Doses of PPSV23 are not needed for people immunized with PPSV23 at or after age 55 years.  Meningococcal vaccine. Adults with asplenia or persistent complement component deficiencies should receive 2 doses of quadrivalent meningococcal conjugate (MenACWY-D) vaccine. The doses should be obtained at least 2 months apart. Microbiologists working with certain meningococcal bacteria, [employer] recruits, people at risk during an outbreak, and people who travel to or live in countries with a high rate of meningitis should be immunized. A first-year college student up through age 58 years who is living in a residence hall should receive a dose if she did not receive a dose on or after her 16th birthday. Adults who have certain high-risk conditions should receive one or more doses of vaccine.  Hepatitis A vaccine. Adults who wish to be protected from this disease, have certain high-risk conditions, work with hepatitis A-infected animals, work in hepatitis A research labs, or travel to or work in countries with a high rate of hepatitis A should be  immunized. Adults who were previously unvaccinated and who anticipate close contact with an international adoptee during the first 60 days after arrival in the [place] from a country with a high rate of hepatitis A should be immunized.  Hepatitis B vaccine.  Adults who wish to be protected from this disease, have certain high-risk conditions, may be exposed to blood or other infectious  body fluids, are household contacts or sex partners of hepatitis B positive people, are clients or workers in certain care facilities, or travel to or work in countries with a high rate of hepatitis B should be immunized.  Haemophilus influenzae type b (Hib) vaccine. A previously unvaccinated person with asplenia or sickle cell disease or having a scheduled splenectomy should receive 1 dose of Hib vaccine. Regardless of previous immunization, a recipient of a hematopoietic stem cell transplant should receive a 3-dose series 6 12 months after her successful transplant. Hib vaccine is not recommended for adults with HIV infection.  Preventive Services / Frequency Ages 6 to 39years  Blood pressure check.** / Every 1 to 2 years.  Lipid and cholesterol check.** / Every 5 years beginning at age 39.  Clinical breast exam.** / Every 3 years for women in their 61s and 62s.  BRCA-related cancer risk assessment.** / For women who have family members with a BRCA-related cancer (breast, ovarian, tubal, or peritoneal cancers).  Pap test.** / Every 2 years from ages 47 through 85. Every 3 years starting at age 34 through age 12 or 74 with a history of 3 consecutive normal Pap tests.  HPV screening.** / Every 3 years from ages 46 through ages 43 to 54 with a history of 3 consecutive normal Pap tests.  Hepatitis C blood test.** / For any individual with known risks for hepatitis C.  Skin self-exam. / Monthly.  Influenza vaccine. / Every year.  Tetanus, diphtheria, and acellular pertussis (Tdap, Td) vaccine.** / Consult your health care provider. Pregnant women should receive 1 dose of Tdap vaccine during each pregnancy. 1 dose of Td every 10 years.  Varicella vaccine.** / Consult your health care provider. Pregnant females who do not have evidence of immunity should receive the first dose after pregnancy.  HPV vaccine. / 3 doses over 6 months, if 64 and younger. The vaccine is not recommended for use in  pregnant females. However, pregnancy testing is not needed before receiving a dose.  Measles, mumps, rubella (MMR) vaccine.** / You need at least 1 dose of MMR if you were born in 1957 or later. You may also need a 2nd dose. For females of childbearing age, rubella immunity should be determined. If there is no evidence of immunity, females who are not pregnant should be vaccinated. If there is no evidence of immunity, females who are pregnant should delay immunization until after pregnancy.  Pneumococcal 13-valent conjugate (PCV13) vaccine.** / Consult your health care provider.  Pneumococcal polysaccharide (PPSV23) vaccine.** / 1 to 2 doses if you smoke cigarettes or if you have certain conditions.  Meningococcal vaccine.** / 1 dose if you are age 71 to 35 years and a Market researcher living in a residence hall, or have one of several medical conditions, you need to get vaccinated against meningococcal disease. You may also need additional booster doses.  Hepatitis A vaccine.** / Consult your health care provider.  Hepatitis B vaccine.** / Consult your health care provider.  Haemophilus influenzae type b (Hib) vaccine.** / Consult your health care provider.  Ages 55 to 64years  Blood pressure check.** / Every 1 to 2 years.  Lipid and cholesterol check.** / Every 5 years beginning at age 20 years.  Lung cancer screening. / Every year if you are aged 55 80 years and have a 30-pack-year history of smoking and currently smoke or have quit within the past 15 years. Yearly screening is stopped once you have quit smoking for at least 15 years or develop a health problem that would prevent you from having lung cancer treatment.  Clinical breast exam.** / Every year after age 40 years.  BRCA-related cancer risk assessment.** / For women who have family members with a BRCA-related cancer (breast, ovarian, tubal, or peritoneal cancers).  Mammogram.** / Every year beginning at age 40  years and continuing for as long as you are in good health. Consult with your health care provider.  Pap test.** / Every 3 years starting at age 30 years through age 65 or 70 years with a history of 3 consecutive normal Pap tests.  HPV screening.** / Every 3 years from ages 30 years through ages 65 to 70 years with a history of 3 consecutive normal Pap tests.  Fecal occult blood test (FOBT) of stool. / Every year beginning at age 50 years and continuing until age 75 years. You may not need to do this test if you get a colonoscopy every 10 years.  Flexible sigmoidoscopy or colonoscopy.** / Every 5 years for a flexible sigmoidoscopy or every 10 years for a colonoscopy beginning at age 50 years and continuing until age 75 years.  Hepatitis C blood test.** / For all people born from 1945 through 1965 and any individual with known risks for hepatitis C.  Skin self-exam. / Monthly.  Influenza vaccine. / Every year.  Tetanus, diphtheria, and acellular pertussis (Tdap/Td) vaccine.** / Consult your health care provider. Pregnant women should receive 1 dose of Tdap vaccine during each pregnancy. 1 dose of Td every 10 years.  Varicella vaccine.** / Consult your health care provider. Pregnant females who do not have evidence of immunity should receive the first dose after pregnancy.  Zoster vaccine.** / 1 dose for adults aged 60 years or older.  Measles, mumps, rubella (MMR) vaccine.** / You need at least 1 dose of MMR if you were born in 1957 or later. You may also need a 2nd dose. For females of childbearing age, rubella immunity should be determined. If there is no evidence of immunity, females who are not pregnant should be vaccinated. If there is no evidence of immunity, females who are pregnant should delay immunization until after pregnancy.  Pneumococcal 13-valent conjugate (PCV13) vaccine.** / Consult your health care provider.  Pneumococcal polysaccharide (PPSV23) vaccine.** / 1 to 2 doses if  you smoke cigarettes or if you have certain conditions.  Meningococcal vaccine.** / Consult your health care provider.  Hepatitis A vaccine.** / Consult your health care provider.  Hepatitis B vaccine.** / Consult your health care provider.  Haemophilus influenzae type b (Hib) vaccine.** / Consult your health care provider.  Ages 65 years and over  Blood pressure check.** / Every 1 to 2 years.  Lipid and cholesterol check.** / Every 5 years beginning at age 20 years.  Lung cancer screening. / Every year if you are aged 55 80 years and have a 30-pack-year history of smoking and currently smoke or have quit within the past 15 years. Yearly screening is stopped once you have quit smoking for at least 15 years or develop a health problem that   would prevent you from having lung cancer treatment.  Clinical breast exam.** / Every year after age 103 years.  BRCA-related cancer risk assessment.** / For women who have family members with a BRCA-related cancer (breast, ovarian, tubal, or peritoneal cancers).  Mammogram.** / Every year beginning at age 36 years and continuing for as long as you are in good health. Consult with your health care provider.  Pap test.** / Every 3 years starting at age 5 years through age 85 or 10 years with 3 consecutive normal Pap tests. Testing can be stopped between 65 and 70 years with 3 consecutive normal Pap tests and no abnormal Pap or HPV tests in the past 10 years.  HPV screening.** / Every 3 years from ages 93 years through ages 70 or 45 years with a history of 3 consecutive normal Pap tests. Testing can be stopped between 65 and 70 years with 3 consecutive normal Pap tests and no abnormal Pap or HPV tests in the past 10 years.  Fecal occult blood test (FOBT) of stool. / Every year beginning at age 8 years and continuing until age 45 years. You may not need to do this test if you get a colonoscopy every 10 years.  Flexible sigmoidoscopy or colonoscopy.** /  Every 5 years for a flexible sigmoidoscopy or every 10 years for a colonoscopy beginning at age 69 years and continuing until age 68 years.  Hepatitis C blood test.** / For all people born from [birth year] through 1967 and any individual with known risks for hepatitis C.  Osteoporosis screening.** / A one-time screening for women ages 7 years and over and women at risk for fractures or osteoporosis.  Skin self-exam. / Monthly.  Influenza vaccine. / Every year.  Tetanus, diphtheria, and acellular pertussis (Tdap/Td) vaccine.** / 1 dose of Td every 10 years.  Varicella vaccine.** / Consult your health care provider.  Zoster vaccine.** / 1 dose for adults aged 5 years or older.  Pneumococcal 13-valent conjugate (PCV13) vaccine.** / Consult your health care provider.  Pneumococcal polysaccharide (PPSV23) vaccine.** / 1 dose for all adults aged 74 years and older.  Meningococcal vaccine.** / Consult your health care provider.  Hepatitis A vaccine.** / Consult your health care provider.  Hepatitis B vaccine.** / Consult your health care provider.  Haemophilus influenzae type b (Hib) vaccine.** / Consult your health care provider. ** Family history and personal history of risk and conditions may change your health care provider's recommendations. Document Released: 10/17/2001 Document Revised: 06/11/2013  [facility] Patient Information 2014 [place], [place].   EXERCISE AND DIET:  We recommended that you start or continue a regular exercise program for good health. Regular exercise means any activity that makes your heart beat faster and makes you sweat.  We recommend exercising at least 30 minutes per day at least 3 days a week, preferably 5.  We also recommend a diet low in fat and sugar / carbohydrates.  Inactivity, poor dietary choices and obesity can cause diabetes, heart attack, stroke, and kidney damage, among others.     ALCOHOL AND SMOKING:  Women should limit their alcohol intake to no  more than 7 drinks/beers/glasses of wine (combined, not each!) per week. Moderation of alcohol intake to this level decreases your risk of breast cancer and liver damage.  ( And of course, no recreational drugs are part of a healthy lifestyle.)  Also, you should not be smoking at all or even being exposed to second hand smoke. Most people know smoking can  cause cancer, and various heart and lung diseases, but did you know it also contributes to weakening of your bones?  Aging of your skin?  Yellowing of your teeth and nails?   CALCIUM AND VITAMIN D:  Adequate intake of calcium and Vitamin D are recommended.  The recommendations for exact amounts of these supplements seem to change often, but generally speaking 600 mg of calcium (either carbonate or citrate) and 800 units of Vitamin D per day seems prudent. Certain women may benefit from higher intake of Vitamin D.  If you are among these women, your doctor will have told you during your visit.     PAP SMEARS:  Pap smears, to check for cervical cancer or precancers,  have traditionally been done yearly, although recent scientific advances have shown that most women can have pap smears less often.  However, every woman still should have a physical exam from her gynecologist or primary care physician every year. It will include a breast check, inspection of the vulva and vagina to check for abnormal growths or skin changes, a visual exam of the cervix, and then an exam to evaluate the size and shape of the uterus and ovaries.  And after 56 years of age, a rectal exam is indicated to check for rectal cancers. We will also provide age appropriate advice regarding health maintenance, like when you should have certain vaccines, screening for sexually transmitted diseases, bone density testing, colonoscopy, mammograms, etc.    MAMMOGRAMS:  All women over 71 years old should have a yearly mammogram. Many facilities now offer a "3D" mammogram, which may cost  around $50 extra out of pocket. If possible,  we recommend you accept the option to have the 3D mammogram performed.  It both reduces the number of women who will be called back for extra views which then turn out to be normal, and it is better than the routine mammogram at detecting truly abnormal areas.     COLONOSCOPY:  Colonoscopy to screen for colon cancer is recommended for all women at age 52.  We know, you hate the idea of the prep.  We agree, BUT, having colon cancer and not knowing it is worse!!  Colon cancer so often starts as a polyp that can be seen and removed at colonscopy, which can quite literally save your life!  And if your first colonoscopy is normal and you have no family history of colon cancer, most women don't have to have it again for 10 years.  Once every ten years, you can do something that may end up saving your life, right?  We will be happy to help you get it scheduled when you are ready.  Be sure to check your insurance coverage so you understand how much it will cost.  It may be covered as a preventative service at no cost, but you should check your particular policy.

## 2020-01-08 NOTE — Progress Notes (Signed)
54 y.o. G87P2021 Married Caucasian female here for annual exam. Patient states that for "a few years" she has had an odor in her urine; patient denies any new symptoms.   Urine odor comes and goes.  No dysuria or urgency.  Some leakage of urine when she first stands up or runs and laughs at the same time. No leakage with just a sneeze or laugh.  Manageable for now. She had better control of her weight when she had lost weight.  Does Kegel's.  She has good bowel function.   Received her Covid vaccine.  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   Urine dip:  Small RBCs.  She had blood work today.   PCP:  [facility] PCP.  No LMP recorded. Patient has had a hysterectomy.           Sexually active: No.  The current method of family planning is status post hysterectomy.    Exercising: No.  The patient does not participate in regular exercise at present. Smoker:  no  Health Maintenance: Pap:  2014 per patient History of abnormal Pap:  no MMG:  12/04/19 BIRADS 1 negative/density b Colonoscopy:  2017 normal polyps.  States she is due in 2027.  BMD:   none  Result  n/a TDaP:  UTD Gardasil:   n/a HIV: negative in pregnancy Hep C: Negative in the past Screening Labs: PCP   reports that she quit smoking about 21 years ago. She has a 15.00 pack-year smoking history. She has never used smokeless tobacco. She reports that she does not drink alcohol or use drugs.  Past Medical History:  Diagnosis Date  . Fatty liver 06/2016  . Fibromyalgia   . Gastric polyp   . Gastritis   . Headache syndrome    hemiparegic  . Hyperlipidemia   . Hypothyroidism   . Liver cyst   . Migraines   . Neuromuscular disorder (HCC)    Fibromyalgia  . Partial tear of Achilles tendon   . Thyroid disease   . Tubular adenoma of colon     Past Surgical History:  Procedure Laterality Date  . ABDOMINAL HYSTERECTOMY    . CRUCIATE  LIGAMENT REPAIR    . CYSTOSCOPY    . DILATION AND CURETTAGE OF UTERUS    . NASAL SEPTUM SURGERY    . PARTIAL HYSTERECTOMY     with laparoscopy- completed hysterectomy (previously had left salpingo-oophorectomy)  . SALPINGOOPHORECTOMY Left   . TUBAL LIGATION      Current Outpatient Medications  Medication Sig Dispense Refill  . Ascorbic Acid (VITAMIN C PO) Take by mouth.    . Cholecalciferol (VITAMIN D3) 2000 units capsule Take 4,000 Units by mouth daily.    . Cyanocobalamin (B-12 PO) Take by mouth.    . levothyroxine (SYNTHROID) 50 MCG tablet Take 1 tablet (50 mcg total) by mouth in the morning and at bedtime. 90 tablet 1  . Magnesium 100 MG TABS     . Multiple Vitamin (MULTIVITAMIN) tablet Take 1 tablet by mouth daily.     No current facility-administered medications for this visit.    Family History  Problem Relation Age of Onset  . Alcohol abuse Mother   . Hyperlipidemia Mother   . Hypertension Mother   . Alcohol abuse Father   . Hyperlipidemia Father   . Hypertension Father   . Alcohol abuse Brother   . Diabetes Son   .  Lung cancer Maternal Uncle   . Heart attack Paternal Uncle   . Hypothyroidism Paternal Uncle   . Alcohol abuse Maternal Grandmother   . Hyperlipidemia Maternal Grandmother   . Hypertension Maternal Grandmother   . Alcohol abuse Maternal Grandfather   . Cancer Paternal Grandmother        abdominal  . Heart attack Paternal Grandfather   . Alcohol abuse Brother   . Stomach cancer Maternal Uncle   . Heart attack Paternal Uncle   . Hypothyroidism Paternal Uncle   . Heart attack Paternal Uncle   . Hypothyroidism Paternal Uncle     Review of Systems  Constitutional: Negative.   HENT: Negative.   Eyes: Negative.   Respiratory: Negative.   Cardiovascular: Negative.   Gastrointestinal: Negative.   Endocrine: Negative.   Genitourinary: Negative.   Musculoskeletal: Negative.   Skin: Negative.   Allergic/Immunologic: Negative.   Neurological:  Negative.   Hematological: Negative.   Psychiatric/Behavioral: Negative.     Exam:   BP 118/68 (BP Location: Right Wrist, Patient Position: Sitting, Cuff Size: Large)   Pulse 80   Temp 98.4 F (36.9 C) (Temporal)   Ht 5' 6.75" (1.695 m)   Wt 218 lb (98.9 kg)   BMI 34.40 kg/m     General appearance: alert, cooperative and appears stated age Head: normocephalic, without obvious abnormality, atraumatic Neck: no adenopathy, supple, symmetrical, trachea midline and thyroid normal to inspection and palpation Lungs: clear to auscultation bilaterally Breasts: normal appearance, no masses or tenderness, No nipple retraction or dimpling, No nipple discharge or bleeding, No axillary adenopathy Heart: regular rate and rhythm Abdomen: soft, non-tender; no masses, no organomegaly Extremities: extremities normal, atraumatic, no cyanosis or edema Skin: skin color, texture, turgor normal. No rashes or lesions Lymph nodes: cervical, supraclavicular, and axillary nodes normal. Neurologic: grossly normal  Pelvic: External genitalia:  no lesions              No abnormal inguinal nodes palpated.              Urethra:  normal appearing urethra with no masses, tenderness or lesions              Bartholins and Skenes: normal                 Vagina: normal appearing vagina with normal color and discharge, no lesions              Cervix: absent              Pap taken: No. Bimanual Exam:  Uterus:  absent              Adnexa: no mass, fullness, tenderness              Rectal exam: Yes.  .  Confirms.              Anus:  normal sphincter tone, no lesions  Chaperone was present for exam.  Assessment:   Well woman visit with normal exam. Status post laparoscopic hysterectomy.  Status post LSO.  Urine odor.  Microscopic hematuria. Vaginal odor.  Hemiplegic migraine.  Hypothyroidism.  Hyperlipidemia.   Plan: Mammogram screening discussed. Self breast awareness reviewed. Pap and HR HPV as  above. Guidelines for Calcium, Vitamin D, regular exercise program including cardiovascular and weight bearing exercise. Urine micro and culture.  Affirm.  Routine labs with PCP.  Follow up annually and prn.   After visit summary provided.

## 2020-01-08 NOTE — Patient Instructions (Signed)

## 2020-01-09 LAB — LIPID PANEL
Chol/HDL Ratio: 3.5 ratio (ref 0.0–4.4)
Cholesterol, Total: 215 mg/dL — ABNORMAL HIGH (ref 100–199)
HDL: 61 mg/dL (ref >39)
LDL Chol Calc (NIH): 140 mg/dL — ABNORMAL HIGH (ref 0–99)
Triglycerides: 78 mg/dL (ref 0–149)
VLDL Cholesterol Cal: 14 mg/dL (ref 5–40)

## 2020-01-09 LAB — VAGINITIS/VAGINOSIS, DNA PROBE
Candida Species: NEGATIVE
Gardnerella vaginalis: NEGATIVE
Trichomonas vaginosis: NEGATIVE

## 2020-01-09 LAB — URINALYSIS, MICROSCOPIC ONLY
Bacteria, UA: NONE SEEN
Casts: NONE SEEN /lpf
RBC, Urine: NONE SEEN /hpf (ref 0–2)
WBC, UA: NONE SEEN /hpf (ref 0–5)

## 2020-01-09 LAB — COMPREHENSIVE METABOLIC PANEL
ALT: 24 IU/L (ref 0–32)
AST: 28 IU/L (ref 0–40)
Albumin/Globulin Ratio: 1.5 (ref 1.2–2.2)
Albumin: 4.3 g/dL (ref 3.8–4.9)
Alkaline Phosphatase: 84 IU/L (ref 39–117)
BUN/Creatinine Ratio: 13 (ref 9–23)
BUN: 12 mg/dL (ref 6–24)
Bilirubin Total: 0.4 mg/dL (ref 0.0–1.2)
CO2: 24 mmol/L (ref 20–29)
Calcium: 9.9 mg/dL (ref 8.7–10.2)
Chloride: 105 mmol/L (ref 96–106)
Creatinine, Ser: 0.91 mg/dL (ref 0.57–1.00)
GFR calc Af Amer: 82 mL/min/{1.73_m2} (ref >59)
GFR calc non Af Amer: 71 mL/min/{1.73_m2} (ref >59)
Globulin, Total: 2.9 g/dL (ref 1.5–4.5)
Glucose: 91 mg/dL (ref 65–99)
Potassium: 4.9 mmol/L (ref 3.5–5.2)
Sodium: 142 mmol/L (ref 134–144)
Total Protein: 7.2 g/dL (ref 6.0–8.5)

## 2020-01-09 LAB — CBC
Hematocrit: 43.4 % (ref 34.0–46.6)
Hemoglobin: 14.1 g/dL (ref 11.1–15.9)
MCH: 28.5 pg (ref 26.6–33.0)
MCHC: 32.5 g/dL (ref 31.5–35.7)
MCV: 88 fL (ref 79–97)
Platelets: 378 10*3/uL (ref 150–450)
RBC: 4.94 x10E6/uL (ref 3.77–5.28)
RDW: 12.7 % (ref 11.7–15.4)
WBC: 5.2 10*3/uL (ref 3.4–10.8)

## 2020-01-09 LAB — TSH: TSH: 0.873 u[IU]/mL (ref 0.450–4.500)

## 2020-01-09 LAB — HEMOGLOBIN A1C
Est. average glucose Bld gHb Est-mCnc: 111 mg/dL
Hgb A1c MFr Bld: 5.5 % (ref 4.8–5.6)

## 2020-01-10 LAB — URINE CULTURE

## 2020-02-03 ENCOUNTER — Encounter: Payer: Self-pay | Admitting: Internal Medicine

## 2020-03-12 ENCOUNTER — Ambulatory Visit: Payer: BC Managed Care – PPO | Admitting: Internal Medicine

## 2020-04-01 ENCOUNTER — Other Ambulatory Visit: Payer: Self-pay | Admitting: Physician Assistant

## 2020-04-01 DIAGNOSIS — E039 Hypothyroidism, unspecified: Secondary | ICD-10-CM

## 2020-05-03 ENCOUNTER — Other Ambulatory Visit: Payer: Self-pay

## 2020-05-03 ENCOUNTER — Encounter: Payer: Self-pay | Admitting: Internal Medicine

## 2020-05-03 ENCOUNTER — Ambulatory Visit (INDEPENDENT_AMBULATORY_CARE_PROVIDER_SITE_OTHER): Payer: Self-pay | Admitting: Internal Medicine

## 2020-05-03 VITALS — BP 118/60 | HR 90 | Ht 66.0 in | Wt 219.0 lb

## 2020-05-03 DIAGNOSIS — E039 Hypothyroidism, unspecified: Secondary | ICD-10-CM

## 2020-05-03 LAB — T3, FREE: T3, Free: 3.6 pg/mL (ref 2.3–4.2)

## 2020-05-03 LAB — TSH: TSH: 0.54 u[IU]/mL (ref 0.35–4.50)

## 2020-05-03 LAB — T4, FREE: Free T4: 1.4 ng/dL (ref 0.60–1.60)

## 2020-05-03 NOTE — Progress Notes (Signed)
Patient ID: Carol Jenkins, female   DOB: Jan 30, 1966, 54 y.o.   MRN: [id number]   This visit occurred during the SARS-CoV-2 public health emergency.  Safety protocols were in place, including screening questions prior to the visit, additional usage of staff PPE, and extensive cleaning of exam room while observing appropriate contact time as indicated for disinfecting solutions.   HPI  Carol Jenkins is a 55 y.o.-year-old female, referred by her PCP, Lorrene Reid, PA-C, for management of hypothyroidism.  She recently moved to the area.  Previously, she was managed by endocrinology in [place].  Pt. has been dx with hypothyroidism in 2000 (after she started to experience dorsal neck pain, dry skin, hair loss, fatigue) - possibly due to traumatic thyroiditis after she got hit in the neck by her son while playing.   She describes that at one point, she was on 250 mcg LT4 daily >> she developed burning  leg pain >> she took herself off LT4 completely in 2015 for 2 months >> saw endocrinology >> recommended to restart the same dose >> refused >> PCP restarted at a lower dose: 80 mcg daily >> 100 mcg daily.  Of note, the burning in her legs got better after a TAH.  However, it did not disappear completely. She saw rheumatology >> suspected fibromyalgia. Relief for the generalized pain: getting in a hot bath. Tried CBD oil >> not helpful.  She is currently on Synthroid d.a.w. 100 mcg daily in the morning (50 mcg tablets - white - pays220$ for 3 months).  She tried to split the dose into 50 mcg twice a day but did not feel a significant difference and she is now back to taking it in the morning.  She takes the thyroid hormone: - fasting: 5-7:30 am - with water - no coffee - separated by 2h from the next meal - no calcium, iron, PPIs, + multivitamins in pm - she takes Selenium 200 mcg, magnesium, B complex -but only occasionally.    I reviewed pt's thyroid tests: Lab Results  Component Value  Date   TSH 0.873 01/08/2020   TSH 1.000 03/13/2019   TSH 1.480 02/05/2018   TSH 1.040 02/04/2018   TSH 1.960 10/29/2017   TSH 1.05 06/19/2016   TSH 1.49 06/12/2016   FREET4 1.18 02/04/2018   FREET4 1.30 06/12/2016   T3FREE 2.4 02/04/2018   Antithyroid antibodies: No results found for: THGAB No components found for: TPOAB  Pt describes: - + increased clearing of throat - + extreme fatigue since the Covid 19 vaccine (immediately after the second dose she felt significant fatigue and could not get out of bed for a week). - weight gain - + cold and heat intolerance - no depression - + constipation and diarrhea - + dry skin, + hair loss, + easy bruising - + Low libido  Pt denies feeling nodules in neck, hoarseness, dysphagia/odynophagia, SOB with lying down.  She has + FH of thyroid disorders on father's side of the family (cousins, aunts, nephew). No FH of thyroid cancer.  No h/o radiation tx to head or neck. No recent use of iodine supplements.  Pt. also has a history of fatty liver and liver cysts.  She has a h/o vit D deficiency. Daily vit D3 4000 IU daily, vit C.  She is a Marine scientist.  ROS: Constitutional: + See HPI Eyes: no blurry vision, no xerophthalmia ENT: no sore throat, + see HPI Cardiovascular: no CP/SOB/palpitations/leg swelling Respiratory: no cough/SOB Gastrointestinal: no N/V/+  D/+ C Musculoskeletal: + Both: Muscle/joint aches Skin: no rashes, + see HPI Neurological: no tremors/numbness/tingling/dizziness Psychiatric: no depression/anxiety  Past Medical History:  Diagnosis Date  . Fatty liver 06/2016  . Fibromyalgia   . Gastric polyp   . Gastritis   . Headache syndrome    hemiparegic  . Hyperlipidemia   . Hypothyroidism   . Liver cyst   . Migraines   . Neuromuscular disorder (HCC)    Fibromyalgia  . Partial tear of Achilles tendon   . Thyroid disease   . Tubular adenoma of Jenkins    Past Surgical History:  Procedure Laterality Date  .  ABDOMINAL HYSTERECTOMY    . CRUCIATE LIGAMENT REPAIR    . CYSTOSCOPY    . DILATION AND CURETTAGE OF UTERUS    . NASAL SEPTUM SURGERY    . PARTIAL HYSTERECTOMY     with laparoscopy- completed hysterectomy (previously had left salpingo-oophorectomy)  . SALPINGOOPHORECTOMY Left   . TUBAL LIGATION     Social History   Socioeconomic History  . Marital status: Married    Spouse name: Not on file  . Number of children: 2  . Years of education: Not on file  . Highest education level: Not on file  Occupational History  . Occupation: nurse  Tobacco Use  . Smoking status: Former Smoker    Packs/day: 1.00    Years: 15.00    Pack years: 15.00    Quit date: 1999    Years since quitting: 22.6  . Smokeless tobacco: Never Used  Vaping Use  . Vaping Use: Never used  Substance and Sexual Activity  . Alcohol use: No  . Drug use: No  . Sexual activity: Not Currently    Birth control/protection: Surgical  Other Topics Concern  . Not on file  Social History Narrative  . Not on file   Social Determinants of Health   Financial Resource Strain:   . Difficulty of Paying Living Expenses: Not on file  Food Insecurity:   . Worried About Charity fundraiser in the Last Year: Not on file  . Ran Out of Food in the Last Year: Not on file  Transportation Needs:   . Lack of Transportation (Medical): Not on file  . Lack of Transportation (Non-Medical): Not on file  Physical Activity:   . Days of Exercise per Week: Not on file  . Minutes of Exercise per Session: Not on file  Stress:   . Feeling of Stress : Not on file  Social Connections:   . Frequency of Communication with Friends and Family: Not on file  . Frequency of Social Gatherings with Friends and Family: Not on file  . Attends Religious Services: Not on file  . Active Member of Clubs or Organizations: Not on file  . Attends Archivist Meetings: Not on file  . Marital Status: Not on file  Intimate Partner Violence:   . Fear  of Current or Ex-Partner: Not on file  . Emotionally Abused: Not on file  . Physically Abused: Not on file  . Sexually Abused: Not on file   Current Outpatient Medications on File Prior to Visit  Medication Sig Dispense Refill  . Ascorbic Acid (VITAMIN C PO) Take by mouth.    . Cholecalciferol (VITAMIN D3) 2000 units capsule Take 4,000 Units by mouth daily.    . Cyanocobalamin (B-12 PO) Take by mouth.    . Magnesium 100 MG TABS     . Multiple Vitamin (MULTIVITAMIN) tablet Take  1 tablet by mouth daily.    Marland Kitchen SYNTHROID 50 MCG tablet TAKE 1 TABLET BY MOUTH IN THE MORNING AND AT BEDTIME 180 tablet 1   No current facility-administered medications on file prior to visit.   Allergies  Allergen Reactions  . Shellfish Allergy Shortness Of Breath    Facial swelling  . Azithromycin     Has taken recently and no reaction  . Erythromycin     Has taken recently and no reaction   Family History  Problem Relation Age of Onset  . Alcohol abuse Mother   . Hyperlipidemia Mother   . Hypertension Mother   . Alcohol abuse Father   . Hyperlipidemia Father   . Hypertension Father   . Alcohol abuse Brother   . Diabetes Son   . Lung cancer Maternal Uncle   . Heart attack Paternal Uncle   . Hypothyroidism Paternal Uncle   . Alcohol abuse Maternal Grandmother   . Hyperlipidemia Maternal Grandmother   . Hypertension Maternal Grandmother   . Alcohol abuse Maternal Grandfather   . Cancer Paternal Grandmother        abdominal  . Heart attack Paternal Grandfather   . Alcohol abuse Brother   . Stomach cancer Maternal Uncle   . Heart attack Paternal Uncle   . Hypothyroidism Paternal Uncle   . Heart attack Paternal Uncle   . Hypothyroidism Paternal Uncle     PE: BP 118/60   Pulse 90   Ht 5\' 6"  (1.676 m)   Wt 219 lb (99.3 kg)   SpO2 97%   BMI 35.35 kg/m  Wt Readings from Last 3 Encounters:  05/03/20 219 lb (99.3 kg)  01/08/20 218 lb (98.9 kg)  01/08/20 219 lb 12.8 oz (99.7 kg)    Constitutional: overweight, in NAD Eyes: PERRLA, EOMI, no exophthalmos ENT: moist mucous membranes, no thyromegaly, no cervical lymphadenopathy Cardiovascular: RRR, No MRG Respiratory: CTA B Gastrointestinal: abdomen soft, NT, ND, BS+ Musculoskeletal: no deformities, strength intact in all 4 Skin: moist, warm, no rashes Neurological: no tremor with outstretched hands, DTR normal in all 4  ASSESSMENT: 1.  Acquired hypothyroidism  PLAN:  1. Patient with long-standing hypothyroidism, on brand-name Synthroid therapy.  It is possible that she developed hypothyroidism after an episode of traumatic thyroiditis after being hit in the neck by her son during playing - we reviewed together her latest TFTs which were normal 3.5 months ago.  Reviewing previous tests, her hypothyroidism has been very well controlled in the past.  Per her report, she was briefly on a very high dose of Synthroid, which she stopped.  - she appears euthyroid on her current Synthroid dose, but describes increased fatigue, hair loss, lack of stamina after her COVID-19 second vaccine dose.  We discussed that this may be related to immune dysregulation after the vaccine, but not necessarily to the thyroid. - she does not appear to have a goiter, thyroid nodules, or neck compression symptoms - We discussed about correct intake of levothyroxine, fasting, with water, separated by at least 30 minutes from the next meal, and separated by more than 4 hours from calcium, iron, multivitamins, acid reflux medications (PPIs).  She is taking 100 mcg of Synthroid daily (made from 50 mcg tablets x2 due to lack of dyes), which we can continue.  She tried to take it twice a day in the past but this did not make a difference in terms of her fatigue, so she is now back to taking it in the morning.  I advised her to continue.  She is taking it correctly. - will check thyroid tests today: TSH, free T4, free T3, I will also check ATA and TPO  antibodies to screen for Hashimoto's thyroiditis. - If labs today are abnormal, she will need to return in ~5-6 weeks for repeat labs - Otherwise, I will see her back in 6 months  Component     Latest Ref Rng & Units 05/03/2020  TSH     0.35 - 4.50 uIU/mL 0.54  T4,Free(Direct)     0.60 - 1.60 ng/dL 1.40  Triiodothyronine,Free,Serum     2.3 - 4.2 pg/mL 3.6  Thyroglobulin Ab     < or = 1 IU/mL 14 (H)  Thyroperoxidase Ab SerPl-aCnc     <9 IU/mL 206 (H)   TFTs are normal but her TPO and ATA antibodies are elevated, giving her a diagnosis of Hashimoto's thyroiditis.  She can try to take selenium  200 mcg daily.  Currently taking it only occasionally.  Philemon Kingdom, MD PhD [facility]

## 2020-05-03 NOTE — Patient Instructions (Signed)
Please continue: - Synthroid 100 mcg daily  Take the thyroid hormone every day, with water, at least 30 minutes before breakfast, separated by at least 4 hours from: - acid reflux medications - calcium - iron - multivitamins  Please stop at the lab.  Please come back for a follow-up appointment in 6 months.

## 2020-05-04 LAB — THYROGLOBULIN ANTIBODY: Thyroglobulin Ab: 14 IU/mL — ABNORMAL HIGH (ref <=1)

## 2020-05-04 LAB — THYROID PEROXIDASE ANTIBODY: Thyroperoxidase Ab SerPl-aCnc: 206 IU/mL — ABNORMAL HIGH (ref <9)

## 2020-05-20 DIAGNOSIS — Z03818 Encounter for observation for suspected exposure to other biological agents ruled out: Secondary | ICD-10-CM | POA: Diagnosis not present

## 2020-05-20 DIAGNOSIS — Z20822 Contact with and (suspected) exposure to covid-19: Secondary | ICD-10-CM | POA: Diagnosis not present

## 2020-05-24 ENCOUNTER — Ambulatory Visit: Payer: Self-pay

## 2020-05-24 ENCOUNTER — Ambulatory Visit (INDEPENDENT_AMBULATORY_CARE_PROVIDER_SITE_OTHER): Payer: Self-pay | Admitting: Orthopaedic Surgery

## 2020-05-24 VITALS — Ht 66.0 in | Wt 219.0 lb

## 2020-05-24 DIAGNOSIS — M25561 Pain in right knee: Secondary | ICD-10-CM

## 2020-05-24 MED ORDER — METHYLPREDNISOLONE ACETATE 40 MG/ML IJ SUSP
40.0000 mg | INTRAMUSCULAR | Status: AC | PRN
  Administered 2020-05-24: 40 mg via INTRA_ARTICULAR

## 2020-05-24 MED ORDER — LIDOCAINE HCL 1 % IJ SOLN
3.0000 mL | INTRAMUSCULAR | Status: AC | PRN
  Administered 2020-05-24: 3 mL

## 2020-05-24 NOTE — Progress Notes (Signed)
Office Visit Note   Patient: Carol Jenkins           Date of Birth: 05-04-1966           MRN: [id number] Visit Date: 05/24/2020              Requested by: Lorrene Reid, PA-C [address] [place],  [place] [id number] PCP: Lorrene Reid, PA-C   Assessment & Plan: Visit Diagnoses:  1. Right knee pain, unspecified chronicity     Plan: I am very concerned that she has a meniscal tear of her right knee based on my physical exam findings. A MRI at this point is warranted to rule out a meniscal tear. I will try steroid injection in her knee today and a knee brace just to help temporize her symptoms. All question concerns were answered addressed. We will see her back in 2 weeks to go over the MRI of her right knee.  Follow-Up Instructions: Return in about 2 weeks (around 06/07/2020).   Orders:  Orders Placed This Encounter  Procedures  . Large Joint Inj  . XR Knee 1-2 Views Right   No orders of the defined types were placed in this encounter.     Procedures: Large Joint Inj: R knee on 05/24/2020 3:16 PM Indications: diagnostic evaluation and pain Details: 22 G 1.5 in needle, superolateral approach  Arthrogram: No  Medications: 3 mL lidocaine 1 %; 40 mg methylPREDNISolone acetate 40 MG/ML Outcome: tolerated well, no immediate complications Procedure, treatment alternatives, risks and benefits explained, specific risks discussed. Consent was given by the patient. Immediately prior to procedure a time out was called to verify the correct patient, procedure, equipment, support staff and site/side marked as required. Patient was prepped and draped in the usual sterile fashion.       Clinical Data: No additional findings.   Subjective: Chief Complaint  Patient presents with  . Right Knee - Pain  The patient is a very pleasant 54 year old female who has right acute knee pain with locking and catching and instability symptoms. She had the knee injured earlier  this summer with a hyperextension injury and then another injury a few months after that. She says walking feels like the knee has been giving way on her. She started to walk with a limp and she is limping for me in the office today. She also points the medial joint line as a source of her pain. She says pivoting activities is very painful and she has been getting an effusion around the knee. She has never had knee surgery or injured this knee before. She is not a diabetic. 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  HPI  Review of Systems She currently denies any headache, chest pain, shortness of breath, fever, chills, nausea, vomiting  Objective: Vital Signs: Ht 5\' 6"  (1.676 m)   Wt 219 lb (99.3 kg)   BMI 35.35 kg/m   Physical Exam She is alert and orient x3 and in no acute distress Ortho Exam Examination of her right knee shows that she lacks full extension by a few degrees and flexion past 90 degrees is incredibly painful. She has medial joint line tenderness and a positive McMurray's exam to the medial compartment of her knee. Her left knee is normal on exam and actually hyperextends which she said her other knee used to. Specialty Comments:  No specialty comments available.  Imaging: XR Knee 1-2 Views Right  Result Date: 05/24/2020  2 views of the right knee show well-maintained joint space with no acute findings.    PMFS History: Patient Active Problem List   Diagnosis Date Noted  . Return to work evaluation 06/05/2019  . Contact dermatitis 03/20/2019  . Work related injury 03/20/2019  . BMI 34.0-34.9,adult 03/13/2019  . Blood in stool 08/22/2018  . Elevated LDL cholesterol level 08/22/2018  . Chest tightness 02/26/2018  . Low libido 02/26/2018  . Nausea 11/21/2017  . S/P hysterectomy with oophorectomy 2014- endometriosis 05/28/2017  . Status post colonoscopy with polypectomy 05/28/2017  . H/O mammogram- 7/17- N 05/28/2017  . Epigastric pain- chronic pain  05/28/2017  . Fatigue 05/28/2017  . Healthcare maintenance 01/30/2017  . Toe pain, bilateral 01/30/2017  . Tendonitis, Achilles, left 01/30/2017  . Hypothyroid 01/30/2017  . Fibromyalgia 01/30/2017   Past Medical History:  Diagnosis Date  . Fatty liver 06/2016  . Fibromyalgia   . Gastric polyp   . Gastritis   . Headache syndrome    hemiparegic  . Hyperlipidemia   . Hypothyroidism   . Liver cyst   . Migraines   . Neuromuscular disorder (HCC)    Fibromyalgia  . Partial tear of Achilles tendon   . Thyroid disease   . Tubular adenoma of colon     Family History  Problem Relation Age of Onset  . Alcohol abuse Mother   . Hyperlipidemia Mother   . Hypertension Mother   . Alcohol abuse Father   . Hyperlipidemia Father   . Hypertension Father   . Alcohol abuse Brother   . Diabetes Son   . Lung cancer Maternal Uncle   . Heart attack Paternal Uncle   . Hypothyroidism Paternal Uncle   . Alcohol abuse Maternal Grandmother   . Hyperlipidemia Maternal Grandmother   . Hypertension Maternal Grandmother   . Alcohol abuse Maternal Grandfather   . Cancer Paternal Grandmother        abdominal  . Heart attack Paternal Grandfather   . Alcohol abuse Brother   . Stomach cancer Maternal Uncle   . Heart attack Paternal Uncle   . Hypothyroidism Paternal Uncle   . Heart attack Paternal Uncle   . Hypothyroidism Paternal Uncle     Past Surgical History:  Procedure Laterality Date  . ABDOMINAL HYSTERECTOMY    . CRUCIATE LIGAMENT REPAIR    . CYSTOSCOPY    . DILATION AND CURETTAGE OF UTERUS    . NASAL SEPTUM SURGERY    . PARTIAL HYSTERECTOMY     with laparoscopy- completed hysterectomy (previously had left salpingo-oophorectomy)  . SALPINGOOPHORECTOMY Left   . TUBAL LIGATION     Social History   Occupational History  . Occupation: nurse  Tobacco Use  . Smoking status: Former Smoker    Packs/day: 1.00    Years: 15.00    Pack years: 15.00    Quit date: 1999    Years since  quitting: 22.7  . Smokeless tobacco: Never Used  Vaping Use  . Vaping Use: Never used  Substance and Sexual Activity  . Alcohol use: No  . Drug use: No  . Sexual activity: Not Currently    Birth control/protection: Surgical

## 2020-05-25 ENCOUNTER — Ambulatory Visit: Payer: Self-pay | Admitting: Physician Assistant

## 2020-05-25 ENCOUNTER — Other Ambulatory Visit: Payer: Self-pay

## 2020-05-25 DIAGNOSIS — M25561 Pain in right knee: Secondary | ICD-10-CM

## 2020-05-27 DIAGNOSIS — Z20822 Contact with and (suspected) exposure to covid-19: Secondary | ICD-10-CM | POA: Diagnosis not present

## 2020-05-27 DIAGNOSIS — Z03818 Encounter for observation for suspected exposure to other biological agents ruled out: Secondary | ICD-10-CM | POA: Diagnosis not present

## 2020-06-05 ENCOUNTER — Ambulatory Visit (HOSPITAL_BASED_OUTPATIENT_CLINIC_OR_DEPARTMENT_OTHER): Payer: BC Managed Care – PPO

## 2020-06-06 ENCOUNTER — Ambulatory Visit (INDEPENDENT_AMBULATORY_CARE_PROVIDER_SITE_OTHER): Payer: BC Managed Care – PPO

## 2020-06-06 ENCOUNTER — Other Ambulatory Visit: Payer: Self-pay

## 2020-06-06 DIAGNOSIS — W51XXXA Accidental striking against or bumped into by another person, initial encounter: Secondary | ICD-10-CM | POA: Diagnosis not present

## 2020-06-06 DIAGNOSIS — M25461 Effusion, right knee: Secondary | ICD-10-CM | POA: Diagnosis not present

## 2020-06-06 DIAGNOSIS — R600 Localized edema: Secondary | ICD-10-CM | POA: Diagnosis not present

## 2020-06-06 DIAGNOSIS — M25561 Pain in right knee: Secondary | ICD-10-CM | POA: Diagnosis not present

## 2020-06-06 DIAGNOSIS — M7121 Synovial cyst of popliteal space [Baker], right knee: Secondary | ICD-10-CM | POA: Diagnosis not present

## 2020-06-06 DIAGNOSIS — M1711 Unilateral primary osteoarthritis, right knee: Secondary | ICD-10-CM | POA: Diagnosis not present

## 2020-06-07 ENCOUNTER — Other Ambulatory Visit: Payer: Self-pay

## 2020-06-07 ENCOUNTER — Telehealth: Payer: Self-pay

## 2020-06-07 ENCOUNTER — Encounter: Payer: Self-pay | Admitting: Orthopaedic Surgery

## 2020-06-07 ENCOUNTER — Ambulatory Visit (INDEPENDENT_AMBULATORY_CARE_PROVIDER_SITE_OTHER): Payer: BC Managed Care – PPO | Admitting: Orthopaedic Surgery

## 2020-06-07 DIAGNOSIS — M1711 Unilateral primary osteoarthritis, right knee: Secondary | ICD-10-CM | POA: Diagnosis not present

## 2020-06-07 DIAGNOSIS — M25561 Pain in right knee: Secondary | ICD-10-CM

## 2020-06-07 NOTE — Telephone Encounter (Signed)
Right knee gel injection  

## 2020-06-07 NOTE — Progress Notes (Signed)
The patient comes in to go over an MRI of her right knee.  She had injured this knee with a hyperextension type of injury back in the summer.  She still has instability of that knee with locking and catching.  A knee brace has helped with some of her instability symptoms.  I did place a steroid injection in her knee the last visit and that has helped with some of her pain as well.  The MRI report of her knee does not show any meniscal tear or ligament disruption of the knee.  There is some partial-thickness to near full-thickness cartilage loss in the medial compartment of the knee and at the patellofemoral joint.  The ACL, PCL are also intact as well as the collateral ligaments.  I shared with her the MRI findings.  I do feel that she is a candidate for outpatient physical therapy to help with her right knee in terms of strengthening and balance and coordination with proprioception.  I would also like to order hyaluronic acid injection for the right knee to treat the pain that she is experiencing possibly from the osteoarthritis component of her knee.  She agrees with this treatment plan.  All question and concerns were answered and addressed.  We will see her back in 4 weeks to see how therapy is going and hopefully to place a hyaluronic acid injection in her knee at that visit.

## 2020-06-08 NOTE — Telephone Encounter (Signed)
Noted  

## 2020-06-09 DIAGNOSIS — Z20822 Contact with and (suspected) exposure to covid-19: Secondary | ICD-10-CM | POA: Diagnosis not present

## 2020-06-09 NOTE — Telephone Encounter (Signed)
Submitted for VOB for Synvisc one-Right knee  °

## 2020-06-11 ENCOUNTER — Telehealth: Payer: Self-pay

## 2020-06-11 NOTE — Telephone Encounter (Signed)
Pt's insurance requires PA for gel injection Paperwork completed and faxed to [facility]

## 2020-06-21 ENCOUNTER — Other Ambulatory Visit: Payer: Self-pay

## 2020-06-24 NOTE — Telephone Encounter (Signed)
I called pt to try to get confirmation on insurance but pt stated she is feeling better and doesn't want to proceed with the gel injection.

## 2020-07-07 DIAGNOSIS — Z1152 Encounter for screening for COVID-19: Secondary | ICD-10-CM | POA: Diagnosis not present

## 2020-07-07 DIAGNOSIS — Z20822 Contact with and (suspected) exposure to covid-19: Secondary | ICD-10-CM | POA: Diagnosis not present

## 2020-07-07 DIAGNOSIS — J029 Acute pharyngitis, unspecified: Secondary | ICD-10-CM | POA: Diagnosis not present

## 2020-07-07 DIAGNOSIS — R051 Acute cough: Secondary | ICD-10-CM | POA: Diagnosis not present

## 2020-07-07 DIAGNOSIS — Z03818 Encounter for observation for suspected exposure to other biological agents ruled out: Secondary | ICD-10-CM | POA: Diagnosis not present

## 2020-07-09 ENCOUNTER — Other Ambulatory Visit: Payer: Self-pay

## 2020-07-09 ENCOUNTER — Encounter: Payer: Self-pay | Admitting: Physician Assistant

## 2020-07-09 ENCOUNTER — Ambulatory Visit (INDEPENDENT_AMBULATORY_CARE_PROVIDER_SITE_OTHER): Payer: BC Managed Care – PPO | Admitting: Physician Assistant

## 2020-07-09 VITALS — BP 124/84 | Temp 98.7°F | Ht 66.0 in | Wt 226.0 lb

## 2020-07-09 DIAGNOSIS — E7800 Pure hypercholesterolemia, unspecified: Secondary | ICD-10-CM | POA: Diagnosis not present

## 2020-07-09 DIAGNOSIS — E039 Hypothyroidism, unspecified: Secondary | ICD-10-CM

## 2020-07-09 DIAGNOSIS — J069 Acute upper respiratory infection, unspecified: Secondary | ICD-10-CM | POA: Diagnosis not present

## 2020-07-09 NOTE — Progress Notes (Signed)
Telehealth office visit note for Carol Reid, PA-C- at Primary Care at [facility]   I connected with current patient today by telephone and verified that I am speaking with the correct person   . Location of the patient: Home . Location of the provider: Office - This visit type was conducted due to national recommendations for restrictions regarding the COVID-19 Pandemic (e.g. social distancing) in an effort to limit this patient's exposure and mitigate transmission in our community.    - No physical exam could be performed with this format, beyond that communicated to [place] by the patient/ family members as noted.   - Additionally my office staff/ schedulers were to discuss with the patient that there may be a monetary charge related to this service, depending on their medical insurance.  My understanding is that patient understood and consented to proceed.     _________________________________________________________________________________   History of Present Illness: Patient calls in to follow-up on hypothyroid and hyperlipidemia.  Patient is currently ill with upper respiratory symptoms of sore throat, cough and headache.  States her symptoms started Sunday night (5 days ago).  Reports exposure to sick contacts who tested negative for Covid but potentially could be false negative.  Patient went to urgent care for testing.  Rapid test came back negative and PCR is still pending.  Patient is taking azithromycin.  Drinking tea and honey which provides relief for cough and sore throat.  HLD: Pt trying to manage with diet and lifestyle modifications.  States recently has not been as good with watching her diet due to events such as weddings.  Hypothyroid: Reports her endocrinologist changed her medication to" white 50 mg pill" and is doing much better.  States is sleeping better and no longer has arthralgias.      No flowsheet data found.  Depression screen [facility] 2/9 01/08/2020  03/20/2019 03/13/2019 08/22/2018 03/19/2018  Decreased Interest 0 0 0 0 0  Down, Depressed, Hopeless 0 0 0 0 0  PHQ - 2 Score 0 0 0 0 0  Altered sleeping 0 0 0 2 1  Tired, decreased energy 1 0 0 1 1  Change in appetite 0 0 0 0 0  Feeling bad or failure about yourself  0 0 0 0 0  Trouble concentrating 0 0 0 0 0  Moving slowly or fidgety/restless 0 0 0 0 0  Suicidal thoughts 0 0 0 0 0  PHQ-9 Score 1 0 0 3 2  Difficult doing work/chores Not difficult at all - - Not difficult at all Not difficult at all      Impression and Recommendations:     1. Elevated LDL cholesterol level   2. Hypothyroidism, unspecified type   3. Upper respiratory tract infection, unspecified type      Elevated LDL cholesterol level: -Last lipid panel: total cholesterol 215, triglycerides 78, HDL 61, LDL 140 -Recommend to follow a heart healthy diet and monitor saturated and trans fats. -Stay as active as possible. -Will continue to monitor and plan to repeat with CPE.  Hypothyroidism: -Followed by Endocrinology. -Continue current medication regimen.  URI: -Recommend to continue with home supportive therapy and complete antibiotic as prescribed. Patient declined tessalon perles.  -If pending PCR test results positive and interested on infusion therapy advised to reach out.   - As part of my medical decision making, I reviewed the following data within the [place] History obtained from pt /family, CMA notes reviewed and  incorporated if applicable, Labs reviewed, Radiograph/ tests reviewed if applicable and OV notes from prior OV's with me, as well as any other specialists she/he has seen since seeing me last, were all reviewed and used in my medical decision making process today.    - Additionally, when appropriate, discussion had with patient regarding our treatment plan, and their biases/concerns about that plan were used in my medical decision making today.    - The patient agreed with  the plan and demonstrated an understanding of the instructions.   No barriers to understanding were identified.     - The patient was advised to call back or seek an in-person evaluation if the symptoms worsen or if the condition fails to improve as anticipated.   Return in about 6 months (around 01/06/2021) for CPE and FBW.    No orders of the defined types were placed in this encounter.   No orders of the defined types were placed in this encounter.   Medications Discontinued During This Encounter  Medication Reason  . Multiple Vitamin (MULTIVITAMIN) tablet        Time spent on visit including pre-visit chart review and post-visit care was 12 minutes.      The [place] was signed into law in 2016 which includes the topic of electronic health records.  This provides immediate access to information in MyChart.  This includes consultation notes, operative notes, office notes, lab results and pathology reports.  If you have any questions about what you read please let us know at your next visit or call us at the office.  We are right here with you.  Note:  This note was prepared with assistance of Dragon voice recognition software. Occasional wrong-word or sound-a-like substitutions may have occurred due to the inherent limitations of voice recognition software.  __________________________________________________________________________________     Patient Care Team    Relationship Specialty Notifications Start End  Carol Jenkins, [place] PCP - General Physician Assistant  05/03/20      -Vitals obtained; medications/ allergies reconciled;  personal medical, social, Sx etc.histories were updated by CMA, reviewed by me and are reflected in chart   Patient Active Problem List   Diagnosis Date Noted  . Unilateral primary osteoarthritis, right knee 06/07/2020  . Return to work evaluation 06/05/2019  . Contact dermatitis 03/20/2019  . Work related injury 03/20/2019   . BMI 34.0-34.9,adult 03/13/2019  . Blood in stool 08/22/2018  . Elevated LDL cholesterol level 08/22/2018  . Chest tightness 02/26/2018  . Low libido 02/26/2018  . Nausea 11/21/2017  . S/P hysterectomy with oophorectomy 2014- endometriosis 05/28/2017  . Status post colonoscopy with polypectomy 05/28/2017  . H/O mammogram- 7/17- N 05/28/2017  . Epigastric pain- chronic pain 05/28/2017  . Fatigue 05/28/2017  . Healthcare maintenance 01/30/2017  . Toe pain, bilateral 01/30/2017  . Tendonitis, Achilles, left 01/30/2017  . Hypothyroid 01/30/2017  . Fibromyalgia 01/30/2017     Current Meds  Medication Sig  . Ascorbic Acid (VITAMIN C PO) Take by mouth.  Marland Kitchen azithromycin (ZITHROMAX) 250 MG tablet Take 2 tablets by mouth 2 (two) times daily.  . Cholecalciferol (VITAMIN D3) 2000 units capsule Take 4,000 Units by mouth daily.  . Cyanocobalamin (B-12 PO) Take by mouth.  . Magnesium 100 MG TABS   . SYNTHROID 50 MCG tablet TAKE 1 TABLET BY MOUTH IN THE MORNING AND AT BEDTIME (Patient taking differently: Take 100 mcg by mouth daily before breakfast. )     Allergies:  Allergies  Allergen Reactions  . Shellfish Allergy Shortness Of Breath    Facial swelling  . Azithromycin     Has taken recently and no reaction  . Erythromycin     Has taken recently and no reaction     ROS:  See above HPI for pertinent positives and negatives   Objective:   Blood pressure 124/84, temperature 98.7 F (37.1 C), temperature source Oral, height 5\' 6"  (1.676 m), weight 226 lb (102.5 kg).  (if some vitals are omitted, this means that patient was UNABLE to obtain them even though they were asked to get them prior to OV today.  They were asked to call us at their earliest convenience with these once obtained. ) General: A & O * 3; sounds in no acute distress Respiratory: speaking in full sentences, no conversational dyspnea Psych: insight appears good, mood- appears full

## 2020-07-16 DIAGNOSIS — R051 Acute cough: Secondary | ICD-10-CM | POA: Diagnosis not present

## 2020-07-16 DIAGNOSIS — H6983 Other specified disorders of Eustachian tube, bilateral: Secondary | ICD-10-CM | POA: Diagnosis not present

## 2020-07-16 DIAGNOSIS — J209 Acute bronchitis, unspecified: Secondary | ICD-10-CM | POA: Diagnosis not present

## 2020-08-25 ENCOUNTER — Other Ambulatory Visit: Payer: Self-pay | Admitting: Physician Assistant

## 2020-08-25 DIAGNOSIS — E039 Hypothyroidism, unspecified: Secondary | ICD-10-CM

## 2020-08-30 ENCOUNTER — Telehealth: Payer: Self-pay | Admitting: Physician Assistant

## 2020-08-30 NOTE — Telephone Encounter (Signed)
Patient needs a refill on her  SYNTHROID 52 MCG tablet [[id number]]   Order Details Dose, Route, Frequency: As Directed  Dispense Quantity: 180 tablet Refills: 1       Sig: TAKE 1 TABLET BY MOUTH IN THE MORNING AND AT BEDTIME  Patient taking differently: Take 100 mcg by mouth daily before breakfast.       Start Date: 04/06/20 End Date: --  Written Date: 04/06/20 Expiration Date: 04/06/21    Diagnosis Association: Hypothyroidism, unspecified type (E03.9)  Original Order:  levothyroxine (SYNTHROID) 50 MCG tablet [[id number]]   Providers  Ordering and Authorizing Provider:   Mayer Masker, PA-C  [address], [place] [address]  Phone:  [phone]  Fax:  [phone]  DEA #:  [id number]  NPI:  [id number]     Ordering User:  Stan Head, CMA       Pharmacy  [facility] [address] [place] - [address] [place] [address]  Phone:  [phone] Fax:  [phone]  DEA #:  [id number]  DAW Reason: --

## 2020-09-09 ENCOUNTER — Other Ambulatory Visit: Payer: Self-pay

## 2020-09-09 ENCOUNTER — Ambulatory Visit (INDEPENDENT_AMBULATORY_CARE_PROVIDER_SITE_OTHER): Payer: BC Managed Care – PPO | Admitting: Physician Assistant

## 2020-09-09 ENCOUNTER — Encounter: Payer: Self-pay | Admitting: Physician Assistant

## 2020-09-09 VITALS — Ht 66.0 in | Wt 220.0 lb

## 2020-09-09 DIAGNOSIS — U071 COVID-19: Secondary | ICD-10-CM

## 2020-09-09 DIAGNOSIS — E039 Hypothyroidism, unspecified: Secondary | ICD-10-CM

## 2020-09-09 MED ORDER — SYNTHROID 50 MCG PO TABS: ORAL_TABLET | ORAL | 1 refills | Status: DC

## 2020-09-09 NOTE — Progress Notes (Signed)
Telehealth office visit note for Carol Masker, PA-C- at Primary Care at [facility]   I connected with current patient today by telephone and verified that I am speaking with the correct person   . Location of the patient: Home . Location of the provider: Office - This visit type was conducted due to national recommendations for restrictions regarding the COVID-19 Pandemic (e.g. social distancing) in an effort to limit this patient's exposure and mitigate transmission in our community.    - No physical exam could be performed with this format, beyond that communicated to [place] by the patient/ family members as noted.   - Additionally my office staff/ schedulers were to discuss with the patient that there may be a monetary charge related to this service, depending on their medical insurance.  My understanding is that patient understood and consented to proceed.     _________________________________________________________________________________   History of Present Illness: Patient calls in to follow-up after testing positive for Covid.  Reports symptoms started last Saturday evening and symptoms gradually progressed.  Was tested for Covid early Monday morning which resulted positive and received monoclonal infusion (casirivimab/imdevimab) Tuesday afternoon.  Feels some improvement but continues to have runny nose, mild sore throat, fatigue, congestion and productive cough with clear sputum. Does experience shortness of breath with long periods of talking or moderate-rigorous activity which last briefly. Has not had a fever today. Last dose of Tylenol was yesterday. Patient is a traveling Engineer, civil (consulting). Requesting refill of thyroid medication. States she tried 50 mg twice daily but did not notice a difference so is taking 2 tablets in the morning.      No flowsheet data found.  Depression screen [facility] 2/9 09/09/2020 01/08/2020 03/20/2019 03/13/2019 08/22/2018  Decreased Interest 0 0 0 0 0  Down,  Depressed, Hopeless 0 0 0 0 0  PHQ - 2 Score 0 0 0 0 0  Altered sleeping 0 0 0 0 2  Tired, decreased energy 0 1 0 0 1  Change in appetite 0 0 0 0 0  Feeling bad or failure about yourself  0 0 0 0 0  Trouble concentrating 0 0 0 0 0  Moving slowly or fidgety/restless 0 0 0 0 0  Suicidal thoughts 0 0 0 0 0  PHQ-9 Score 0 1 0 0 3  Difficult doing work/chores - Not difficult at all - - Not difficult at all  Some recent data might be hidden      Impression and Recommendations:     1. COVID-19 virus infection   2. Hypothyroidism, unspecified type     COVID-19 virus infection: -Recommend to continue supportive care.  Patient has a prednisone pack that she has not started and recommend to start taking as instructed.  Recommend to take Mucinex. -Continue monitoring symptoms including pulse ox. -Discussed isolation guidelines. Recommend to refer to [facility] for further information.  Hypothyroidism, unspecified type: -Reviewed Endocrinology note.  -Continue current medication regimen (100 mg in am). Provided refills. -Will continue to monitor.  Advised to schedule CPE and FBW in May when aware of new work schedule.    - As part of my medical decision making, I reviewed the following data within the electronic MEDICAL RECORD NUMBER History obtained from pt /family, CMA notes reviewed and incorporated if applicable, Labs reviewed, Radiograph/ tests reviewed if applicable and OV notes from prior OV's with me, as well as any other specialists she/he has seen since seeing me last, were all reviewed  and used in my medical decision making process today.    - Additionally, when appropriate, discussion had with patient regarding our treatment plan, and their biases/concerns about that plan were used in my medical decision making today.    - The patient agreed with the plan and demonstrated an understanding of the instructions.   No barriers to understanding were identified.     - The patient was advised to  call back or seek an in-person evaluation if the symptoms worsen or if the condition fails to improve as anticipated.   Return if symptoms worsen or fail to improve.    No orders of the defined types were placed in this encounter.   Meds ordered this encounter  Medications  . SYNTHROID 50 MCG tablet    Sig: Take 2 tablets by mouth in the morning before breakfast.    Dispense:  180 tablet    Refill:  1    Order Specific Question:   Supervising Provider    Answer:   Beatrice Lecher D [[id number]]    Medications Discontinued During This Encounter  Medication Reason  . SYNTHROID 50 MCG tablet Reorder       Time spent on visit including pre-visit chart review and post-visit care was 16 minutes.      The [place] was signed into law in 2016 which includes the topic of electronic health records.  This provides immediate access to information in MyChart.  This includes consultation notes, operative notes, office notes, lab results and pathology reports.  If you have any questions about what you read please let us know at your next visit or call us at the office.  We are right here with you.  Note:  This note was prepared with assistance of Dragon voice recognition software. Occasional wrong-word or sound-a-like substitutions may have occurred due to the inherent limitations of voice recognition software.  __________________________________________________________________________________     Patient Care Team    Relationship Specialty Notifications Start End  Lorrene Reid, [place] PCP - General Physician Assistant  05/03/20      -Vitals obtained; medications/ allergies reconciled;  personal medical, social, Sx etc.histories were updated by CMA, reviewed by me and are reflected in chart   Patient Active Problem List   Diagnosis Date Noted  . Unilateral primary osteoarthritis, right knee 06/07/2020  . Return to work evaluation 06/05/2019  . Contact dermatitis  03/20/2019  . Work related injury 03/20/2019  . BMI 34.0-34.9,adult 03/13/2019  . Blood in stool 08/22/2018  . Elevated LDL cholesterol level 08/22/2018  . Chest tightness 02/26/2018  . Low libido 02/26/2018  . Nausea 11/21/2017  . S/P hysterectomy with oophorectomy 2014- endometriosis 05/28/2017  . Status post colonoscopy with polypectomy 05/28/2017  . H/O mammogram- 7/17- N 05/28/2017  . Epigastric pain- chronic pain 05/28/2017  . Fatigue 05/28/2017  . Healthcare maintenance 01/30/2017  . Toe pain, bilateral 01/30/2017  . Tendonitis, Achilles, left 01/30/2017  . Hypothyroid 01/30/2017  . Fibromyalgia 01/30/2017     Current Meds  Medication Sig  . Ascorbic Acid (VITAMIN C PO) Take by mouth.  . Cholecalciferol (VITAMIN D3) 2000 units capsule Take 4,000 Units by mouth daily.  . Cyanocobalamin (B-12 PO) Take by mouth.  . Magnesium 100 MG TABS   . [DISCONTINUED] SYNTHROID 50 MCG tablet TAKE 1 TABLET BY MOUTH IN THE MORNING AND AT BEDTIME (Patient taking differently: Take 100 mcg by mouth daily before breakfast.)     Allergies:  Allergies  Allergen Reactions  .  Shellfish Allergy Shortness Of Breath    Facial swelling  . Azithromycin     Has taken recently and no reaction  . Erythromycin     Has taken recently and no reaction     ROS:  See above HPI for pertinent positives and negatives   Objective:   Height 5\' 6"  (1.676 m), weight 220 lb (99.8 kg).  (if some vitals are omitted, this means that patient was UNABLE to obtain them. ) General: A & O * 3; sounds in no acute distress Respiratory: speaking in full sentences, no conversational dyspnea Psych: insight appears good, mood- appears full

## 2020-09-13 ENCOUNTER — Telehealth: Payer: Self-pay | Admitting: Physician Assistant

## 2020-09-13 MED ORDER — AMOXICILLIN-POT CLAVULANATE 875-125 MG PO TABS: 1.0000 | ORAL_TABLET | Freq: Two times a day (BID) | ORAL | 0 refills | Status: DC

## 2020-09-13 NOTE — Addendum Note (Signed)
Addended by: Mickel Crow on: 09/13/2020 08:59 AM   Modules accepted: Orders

## 2020-09-13 NOTE — Telephone Encounter (Signed)
Per Herb Grays sending rx for Augmentin x 7 days. AS, CMA

## 2020-09-13 NOTE — Telephone Encounter (Signed)
Patient thinks she has an ear infection and would like something called in for her headache. Thanks

## 2020-09-13 NOTE — Telephone Encounter (Signed)
Patient would need to be evaluated for ear infection. Make sure patient is covid negative and if she has been tested and is please add her to schedule this am. AS, CMA

## 2020-09-13 NOTE — Telephone Encounter (Signed)
[facility] [address] [facility]

## 2020-09-30 ENCOUNTER — Telehealth: Payer: Self-pay | Admitting: Physician Assistant

## 2020-09-30 NOTE — Telephone Encounter (Signed)
Insurance calling to request medical records, provided HIM phone and fax. thank you

## 2020-11-01 ENCOUNTER — Ambulatory Visit: Payer: Self-pay | Admitting: Internal Medicine

## 2021-01-06 NOTE — Progress Notes (Deleted)
55 y.o. G74P2021 Married Caucasian female here for annual exam.    PCP:  Lorrene Reid, PA-C  No LMP recorded. Patient has had a hysterectomy.           Sexually active: {yes no:314532}  The current method of family planning is status post hysterectomy.    Exercising: {yes no:314532}  {types:19826} Smoker:  no  Health Maintenance: Pap: 2014 normal per patient History of abnormal Pap:  no MMG: 12/04/19 BIRADS 1 negative/density b Colonoscopy:  2017 normal polyps.  States she is due in 2027.   BMD: n/a  Result  n/a TDaP:  *** Gardasil:   no HIV: Neg in preg Hep C: Neg in past Screening Labs:  Hb today: ***, Urine today: ***   reports that she quit smoking about 23 years ago. She has a 15.00 pack-year smoking history. She has never used smokeless tobacco. She reports that she does not drink alcohol and does not use drugs.  Past Medical History:  Diagnosis Date  . Fatty liver 06/2016  . Fibromyalgia   . Gastric polyp   . Gastritis   . Headache syndrome    hemiparegic  . Hyperlipidemia   . Hypothyroidism   . Liver cyst   . Migraines   . Neuromuscular disorder (HCC)    Fibromyalgia  . Partial tear of Achilles tendon   . Thyroid disease   . Tubular adenoma of colon     Past Surgical History:  Procedure Laterality Date  . ABDOMINAL HYSTERECTOMY    . CRUCIATE LIGAMENT REPAIR    . CYSTOSCOPY    . DILATION AND CURETTAGE OF UTERUS    . NASAL SEPTUM SURGERY    . PARTIAL HYSTERECTOMY     with laparoscopy- completed hysterectomy (previously had left salpingo-oophorectomy)  . SALPINGOOPHORECTOMY Left   . TUBAL LIGATION      Current Outpatient Medications  Medication Sig Dispense Refill  . albuterol (VENTOLIN HFA) 108 (90 Base) MCG/ACT inhaler Inhale into the lungs. (Patient not taking: Reported on 09/09/2020)    . amoxicillin-clavulanate (AUGMENTIN) 875-125 MG tablet Take 1 tablet by mouth 2 (two) times daily. 14 tablet 0  . Ascorbic Acid (VITAMIN C PO) Take by mouth.    .  Cholecalciferol (VITAMIN D3) 2000 units capsule Take 4,000 Units by mouth daily.    . Cyanocobalamin (B-12 PO) Take by mouth.    . fluticasone (FLONASE) 50 MCG/ACT nasal spray Place into both nostrils. (Patient not taking: Reported on 09/09/2020)    . Magnesium 100 MG TABS     . SYNTHROID 50 MCG tablet Take 2 tablets by mouth in the morning before breakfast. 180 tablet 1   No current facility-administered medications for this visit.    Family History  Problem Relation Age of Onset  . Alcohol abuse Mother   . Hyperlipidemia Mother   . Hypertension Mother   . Alcohol abuse Father   . Hyperlipidemia Father   . Hypertension Father   . Alcohol abuse Brother   . Diabetes Son   . Lung cancer Maternal Uncle   . Heart attack Paternal Uncle   . Hypothyroidism Paternal Uncle   . Alcohol abuse Maternal Grandmother   . Hyperlipidemia Maternal Grandmother   . Hypertension Maternal Grandmother   . Alcohol abuse Maternal Grandfather   . Cancer Paternal Grandmother        abdominal  . Heart attack Paternal Grandfather   . Alcohol abuse Brother   . Stomach cancer Maternal Uncle   . Heart attack  Paternal Uncle   . Hypothyroidism Paternal Uncle   . Heart attack Paternal Uncle   . Hypothyroidism Paternal Uncle     Review of Systems  Exam:   There were no vitals taken for this visit.    General appearance: alert, cooperative and appears stated age Head: normocephalic, without obvious abnormality, atraumatic Neck: no adenopathy, supple, symmetrical, trachea midline and thyroid normal to inspection and palpation Lungs: clear to auscultation bilaterally Breasts: normal appearance, no masses or tenderness, No nipple retraction or dimpling, No nipple discharge or bleeding, No axillary adenopathy Heart: regular rate and rhythm Abdomen: soft, non-tender; no masses, no organomegaly Extremities: extremities normal, atraumatic, no cyanosis or edema Skin: skin color, texture, turgor normal. No rashes  or lesions Lymph nodes: cervical, supraclavicular, and axillary nodes normal. Neurologic: grossly normal  Pelvic: External genitalia:  no lesions              No abnormal inguinal nodes palpated.              Urethra:  normal appearing urethra with no masses, tenderness or lesions              Bartholins and Skenes: normal                 Vagina: normal appearing vagina with normal color and discharge, no lesions              Cervix: no lesions              Pap taken: {yes no:314532} Bimanual Exam:  Uterus:  normal size, contour, position, consistency, mobility, non-tender              Adnexa: no mass, fullness, tenderness              Rectal exam: {yes no:314532}.  Confirms.              Anus:  normal sphincter tone, no lesions  Chaperone was present for exam.  Assessment:   Well woman visit with normal exam.   Plan: Mammogram screening discussed. Self breast awareness reviewed. Pap and HR HPV as above. Guidelines for Calcium, Vitamin D, regular exercise program including cardiovascular and weight bearing exercise.   Follow up annually and prn.   Additional counseling given.  {yes [id number]. _______ minutes face to face time of which over 50% was spent in counseling.    After visit summary provided.

## 2021-01-10 ENCOUNTER — Ambulatory Visit: Payer: BC Managed Care – PPO | Admitting: Obstetrics and Gynecology

## 2021-01-10 DIAGNOSIS — Z0289 Encounter for other administrative examinations: Secondary | ICD-10-CM

## 2021-03-11 ENCOUNTER — Other Ambulatory Visit: Payer: Self-pay | Admitting: Physician Assistant

## 2021-03-11 DIAGNOSIS — E039 Hypothyroidism, unspecified: Secondary | ICD-10-CM

## 2021-03-11 MED ORDER — SYNTHROID 50 MCG PO TABS: ORAL_TABLET | ORAL | 0 refills | Status: DC

## 2021-04-08 ENCOUNTER — Ambulatory Visit (INDEPENDENT_AMBULATORY_CARE_PROVIDER_SITE_OTHER): Payer: Self-pay | Admitting: Physician Assistant

## 2021-04-08 ENCOUNTER — Other Ambulatory Visit: Payer: Self-pay

## 2021-04-08 ENCOUNTER — Encounter: Payer: Self-pay | Admitting: Physician Assistant

## 2021-04-08 VITALS — BP 94/63 | HR 72 | Temp 98.0°F | Wt 220.3 lb

## 2021-04-08 DIAGNOSIS — Z1321 Encounter for screening for nutritional disorder: Secondary | ICD-10-CM | POA: Diagnosis not present

## 2021-04-08 DIAGNOSIS — Z130 Encounter for screening for diseases of the blood and blood-forming organs and certain disorders involving the immune mechanism: Secondary | ICD-10-CM | POA: Diagnosis not present

## 2021-04-08 DIAGNOSIS — Z13228 Encounter for screening for other metabolic disorders: Secondary | ICD-10-CM | POA: Diagnosis not present

## 2021-04-08 DIAGNOSIS — E7800 Pure hypercholesterolemia, unspecified: Secondary | ICD-10-CM | POA: Diagnosis not present

## 2021-04-08 DIAGNOSIS — Z0000 Encounter for general adult medical examination without abnormal findings: Secondary | ICD-10-CM

## 2021-04-08 DIAGNOSIS — Z1329 Encounter for screening for other suspected endocrine disorder: Secondary | ICD-10-CM | POA: Diagnosis not present

## 2021-04-08 DIAGNOSIS — E039 Hypothyroidism, unspecified: Secondary | ICD-10-CM

## 2021-04-08 NOTE — Patient Instructions (Signed)
Preventive Care 57-57 Years Old, Female Preventive care refers to lifestyle choices and visits with your health care provider that can promote health and wellness. This includes: A yearly physical exam. This is also called an annual wellness visit. Regular dental and eye exams. Immunizations. Screening for certain conditions. Healthy lifestyle choices, such as: Eating a healthy diet. Getting regular exercise. Not using drugs or products that contain nicotine and tobacco. Limiting alcohol use. What can I expect for my preventive care visit? Physical exam Your health care provider will check your: Height and weight. These may be used to calculate your BMI (body mass index). BMI is a measurement that tells if you are at a healthy weight. Heart rate and blood pressure. Body temperature. Skin for abnormal spots. Counseling Your health care provider may ask you questions about your: Past medical problems. Family's medical history. Alcohol, tobacco, and drug use. Emotional well-being. Home life and relationship well-being. Sexual activity. Diet, exercise, and sleep habits. Work and work Statistician. Access to firearms. Method of birth control. Menstrual cycle. Pregnancy history. What immunizations do I need?  Vaccines are usually given at various ages, according to a schedule. Your health care provider will recommend vaccines for you based on your age, medicalhistory, and lifestyle or other factors, such as travel or where you work. What tests do I need? Blood tests Lipid and cholesterol levels. These may be checked every 5 years, or more often if you are over 57 years old. Hepatitis C test. Hepatitis B test. Screening Lung cancer screening. You may have this screening every year starting at age 57 if you have a 30-pack-year history of smoking and currently smoke or have quit within the past 15 years. Colorectal cancer screening. All adults should have this screening starting at  age 57 and continuing until age 57. Your health care provider may recommend screening at age 57 if you are at increased risk. You will have tests every 1-10 years, depending on your results and the type of screening test. Diabetes screening. This is done by checking your blood sugar (glucose) after you have not eaten for a while (fasting). You may have this done every 1-3 years. Mammogram. This may be done every 1-2 years. Talk with your health care provider about when you should start having regular mammograms. This may depend on whether you have a family history of breast cancer. BRCA-related cancer screening. This may be done if you have a family history of breast, ovarian, tubal, or peritoneal cancers. Pelvic exam and Pap test. This may be done every 3 years starting at age 57. Starting at age 57, this may be done every 5 years if you have a Pap test in combination with an HPV test. Other tests STD (sexually transmitted disease) testing, if you are at risk. Bone density scan. This is done to screen for osteoporosis. You may have this scan if you are at high risk for osteoporosis. Talk with your health care provider about your test results, treatment options,and if necessary, the need for more tests. Follow these instructions at home: Eating and drinking  Eat a diet that includes fresh fruits and vegetables, whole grains, lean protein, and low-fat dairy products. Take vitamin and mineral supplements as recommended by your health care provider. Do not drink alcohol if: Your health care provider tells you not to drink. You are pregnant, may be pregnant, or are planning to become pregnant. If you drink alcohol: Limit how much you have to 0-1 drink a day. Be aware  of how much alcohol is in your drink. In the [place] one drink equals one 12 oz bottle of beer (355 mL), one 5 oz glass of wine (148 mL), or one 1 oz glass of hard liquor (44 mL).  Lifestyle Take daily care of your teeth and  gums. Brush your teeth every morning and night with fluoride toothpaste. Floss one time each day. Stay active. Exercise for at least 30 minutes 5 or more days each week. Do not use any products that contain nicotine or tobacco, such as cigarettes, e-cigarettes, and chewing tobacco. If you need help quitting, ask your health care provider. Do not use drugs. If you are sexually active, practice safe sex. Use a condom or other form of protection to prevent STIs (sexually transmitted infections). If you do not wish to become pregnant, use a form of birth control. If you plan to become pregnant, see your health care provider for a prepregnancy visit. If told by your health care provider, take low-dose aspirin daily starting at age 57. Find healthy ways to cope with stress, such as: Meditation, yoga, or listening to music. Journaling. Talking to a trusted person. Spending time with friends and family. Safety Always wear your seat belt while driving or riding in a vehicle. Do not drive: If you have been drinking alcohol. Do not ride with someone who has been drinking. When you are tired or distracted. While texting. Wear a helmet and other protective equipment during sports activities. If you have firearms in your house, make sure you follow all gun safety procedures. What's next? Visit your health care provider once a year for an annual wellness visit. Ask your health care provider how often you should have your eyes and teeth checked. Stay up to date on all vaccines. This information is not intended to replace advice given to you by your health care provider. Make sure you discuss any questions you have with your healthcare provider. Document Revised: 05/25/2020 Document Reviewed: 05/02/2018 Elsevier Patient Education  2022 [employer].

## 2021-04-08 NOTE — Progress Notes (Signed)
Subjective:     Carol Jenkins is a 55 y.o. female and is here for a comprehensive physical exam. The patient reports  noticing easy bruising .  Social History   Socioeconomic History   Marital status: Married    Spouse name: Not on file   Number of children: 2   Years of education: Not on file   Highest education level: Not on file  Occupational History   Occupation: nurse  Tobacco Use   Smoking status: Former    Packs/day: 1.00    Years: 15.00    Pack years: 15.00    Types: Cigarettes    Quit date: 1999    Years since quitting: 23.6   Smokeless tobacco: Never  Vaping Use   Vaping Use: Never used  Substance and Sexual Activity   Alcohol use: No   Drug use: No   Sexual activity: Not Currently    Birth control/protection: Surgical  Other Topics Concern   Not on file  Social History Narrative   Not on file   Social Determinants of Health   Financial Resource Strain: Not on file  Food Insecurity: Not on file  Transportation Needs: Not on file  Physical Activity: Not on file  Stress: Not on file  Social Connections: Not on file  Intimate Partner Violence: Not on file   Health Maintenance  Topic Date Due   PAP SMEAR-Modifier  Never done   Zoster Vaccines- Shingrix (1 of 2) Never done   COVID-19 Vaccine (3 - Booster for Moderna series) 05/30/2020   INFLUENZA VACCINE  04/04/2021   MAMMOGRAM  12/03/2021   COLONOSCOPY (Pts 45-60yr Insurance coverage will need to be confirmed)  01/26/2026   Hepatitis C Screening  Completed   HIV Screening  Completed   Pneumococcal Vaccine [id number] old  Aged Out   HPV VACCINES  Aged Out   TETANUS/TDAP  Discontinued    The following portions of the patient's history were reviewed and updated as appropriate: allergies, current medications, past family history, past medical history, past social history, past surgical history, and problem list.  Review of Systems Pertinent items noted in HPI and remainder of comprehensive ROS  otherwise negative.   Objective:    BP 94/63   Pulse 72   Temp 98 F (36.7 C)   Wt 220 lb 4.8 oz (99.9 kg)   SpO2 97%   BMI 35.56 kg/m  General appearance: alert, cooperative, and no distress Head: Normocephalic, without obvious abnormality, atraumatic Eyes: conjunctivae/corneas clear. PERRL, EOM's intact. Fundi benign. Ears: normal TM's and external ear canals both ears Nose: Nares normal. Septum midline. Mucosa normal. No drainage or sinus tenderness. Throat: lips, mucosa, and tongue normal; teeth and gums normal Neck: no adenopathy, no carotid bruit, no JVD, supple, symmetrical, trachea midline, and thyroid: no thyromegaly  Back: symmetric, no curvature. ROM normal. No CVA tenderness. Lungs: clear to auscultation bilaterally Heart: regular rate and rhythm, S1, S2 normal, no murmur, click, rub or gallop Abdomen: soft, non-tender; bowel sounds normal; no masses,  no organomegaly Extremities: extremities normal, atraumatic, no cyanosis or edema Pulses: 2+ and symmetric Skin: mobility and turgor normal and no edema or ecchymoses - lower leg(s) bilateral, different stages, most resolved Lymph nodes: Cervical adenopathy: normal and Supraclavicular adenopathy: normal Neurologic: Grossly normal    Assessment:    Healthy female exam.     Plan:  -Will obtain fasting labs including CBC w/d to evaluate platelet count. -Followed by OB/GYN ([facility] for female  exam (s/p hysterectomy). Declined mammogram, bone density. -Due for repeat colonoscopy and reports will defer until next year. -Declined Shingrix. -Recommend to continue to stay as active as possible, stay well hydrated and continue weight loss efforts with diet changes.  -Follow up  in 1 year for CPE and FBW or sooner if needed  See After Visit Summary for Counseling Recommendations

## 2021-04-09 LAB — CBC WITH DIFFERENTIAL/PLATELET
Basophils Absolute: 0.1 10*3/uL (ref 0.0–0.2)
Basos: 1 %
EOS (ABSOLUTE): 0.1 10*3/uL (ref 0.0–0.4)
Eos: 1 %
Hematocrit: 41.2 % (ref 34.0–46.6)
Hemoglobin: 13.7 g/dL (ref 11.1–15.9)
Immature Grans (Abs): 0 10*3/uL (ref 0.0–0.1)
Immature Granulocytes: 0 %
Lymphocytes Absolute: 2.1 10*3/uL (ref 0.7–3.1)
Lymphs: 31 %
MCH: 29.1 pg (ref 26.6–33.0)
MCHC: 33.3 g/dL (ref 31.5–35.7)
MCV: 88 fL (ref 79–97)
Monocytes Absolute: 0.5 10*3/uL (ref 0.1–0.9)
Monocytes: 7 %
Neutrophils Absolute: 4 10*3/uL (ref 1.4–7.0)
Neutrophils: 60 %
Platelets: 343 10*3/uL (ref 150–450)
RBC: 4.7 x10E6/uL (ref 3.77–5.28)
RDW: 12.7 % (ref 11.7–15.4)
WBC: 6.8 10*3/uL (ref 3.4–10.8)

## 2021-04-09 LAB — COMPREHENSIVE METABOLIC PANEL
ALT: 18 IU/L (ref 0–32)
AST: 18 IU/L (ref 0–40)
Albumin/Globulin Ratio: 1.6 (ref 1.2–2.2)
Albumin: 4.4 g/dL (ref 3.8–4.9)
Alkaline Phosphatase: 94 IU/L (ref 44–121)
BUN/Creatinine Ratio: 16 (ref 9–23)
BUN: 13 mg/dL (ref 6–24)
Bilirubin Total: 0.5 mg/dL (ref 0.0–1.2)
CO2: 24 mmol/L (ref 20–29)
Calcium: 10.2 mg/dL (ref 8.7–10.2)
Chloride: 101 mmol/L (ref 96–106)
Creatinine, Ser: 0.79 mg/dL (ref 0.57–1.00)
Globulin, Total: 2.7 g/dL (ref 1.5–4.5)
Glucose: 91 mg/dL (ref 65–99)
Potassium: 4.4 mmol/L (ref 3.5–5.2)
Sodium: 140 mmol/L (ref 134–144)
Total Protein: 7.1 g/dL (ref 6.0–8.5)
eGFR: 87 mL/min/{1.73_m2} (ref >59)

## 2021-04-09 LAB — T4, FREE: Free T4: 1.54 ng/dL (ref 0.82–1.77)

## 2021-04-09 LAB — LIPID PANEL
Chol/HDL Ratio: 3.8 ratio (ref 0.0–4.4)
Cholesterol, Total: 229 mg/dL — ABNORMAL HIGH (ref 100–199)
HDL: 60 mg/dL (ref >39)
LDL Chol Calc (NIH): 150 mg/dL — ABNORMAL HIGH (ref 0–99)
Triglycerides: 106 mg/dL (ref 0–149)
VLDL Cholesterol Cal: 19 mg/dL (ref 5–40)

## 2021-04-09 LAB — T3: T3, Total: 107 ng/dL (ref 71–180)

## 2021-04-09 LAB — HEMOGLOBIN A1C
Est. average glucose Bld gHb Est-mCnc: 108 mg/dL
Hgb A1c MFr Bld: 5.4 % (ref 4.8–5.6)

## 2021-04-09 LAB — VITAMIN D 25 HYDROXY (VIT D DEFICIENCY, FRACTURES): Vit D, 25-Hydroxy: 45.2 ng/mL (ref 30.0–100.0)

## 2021-04-09 LAB — TSH: TSH: 0.592 u[IU]/mL (ref 0.450–4.500)

## 2021-05-25 DIAGNOSIS — M25462 Effusion, left knee: Secondary | ICD-10-CM | POA: Diagnosis not present

## 2021-07-03 ENCOUNTER — Other Ambulatory Visit: Payer: Self-pay | Admitting: Physician Assistant

## 2021-07-03 DIAGNOSIS — E039 Hypothyroidism, unspecified: Secondary | ICD-10-CM

## 2021-08-15 DIAGNOSIS — J069 Acute upper respiratory infection, unspecified: Secondary | ICD-10-CM | POA: Diagnosis not present

## 2021-08-17 DIAGNOSIS — J209 Acute bronchitis, unspecified: Secondary | ICD-10-CM | POA: Diagnosis not present

## 2021-09-07 ENCOUNTER — Ambulatory Visit (INDEPENDENT_AMBULATORY_CARE_PROVIDER_SITE_OTHER): Payer: 59 | Admitting: Physician Assistant

## 2021-09-07 ENCOUNTER — Other Ambulatory Visit: Payer: Self-pay

## 2021-09-07 ENCOUNTER — Encounter: Payer: Self-pay | Admitting: Physician Assistant

## 2021-09-07 VITALS — Ht 66.0 in | Wt 220.0 lb

## 2021-09-07 DIAGNOSIS — J988 Other specified respiratory disorders: Secondary | ICD-10-CM | POA: Diagnosis not present

## 2021-09-07 DIAGNOSIS — B9689 Other specified bacterial agents as the cause of diseases classified elsewhere: Secondary | ICD-10-CM | POA: Diagnosis not present

## 2021-09-07 DIAGNOSIS — H6093 Unspecified otitis externa, bilateral: Secondary | ICD-10-CM | POA: Diagnosis not present

## 2021-09-07 MED ORDER — AZITHROMYCIN 250 MG PO TABS: ORAL_TABLET | ORAL | 0 refills | Status: AC

## 2021-09-07 MED ORDER — HYDROCORTISONE-ACETIC ACID 1-2 % OT SOLN: 4.0000 [drp] | Freq: Three times a day (TID) | OTIC | 0 refills | Status: AC

## 2021-09-07 NOTE — Progress Notes (Signed)
Telehealth office visit note for Carol Reid, PA-C- at Primary Care at [facility]   I connected with current patient today by telephone and verified that I am speaking with the correct person    Location of the patient: Home  Location of the provider: Office - This visit type was conducted due to national recommendations for restrictions regarding the COVID-19 Pandemic (e.g. social distancing) in an effort to limit this patient's exposure and mitigate transmission in our community.    - No physical exam could be performed with this format, beyond that communicated to [place] by the patient/ family members as noted.   - Additionally my office staff/ schedulers were to discuss with the patient that there may be a monetary charge related to this service, depending on their medical insurance.  My understanding is that patient understood and consented to proceed.     _________________________________________________________________________________   History of Present Illness: Patient calls in with c/o cough, fever that comes and goes, bilateral ear pain and sore throat.  Symptoms started about 3 to 4 weeks ago.  Patient states went to urgent care December 12 and was treated with doxycycline for 5 days and albuterol inhaler for bronchitis and ear fluid.  States her symptoms got worse and went back to urgent care December 14 and was diagnosed with possible pneumonia.  Patient completed doxycycline, albuterol and prednisone but continues to have symptoms.  Reports started taking Mucinex which has helped.  Also reports itchy ears and decreased hearing. Also feels like right ear is swollen and puffy. Did a COVID test yesterday which resulted negative.  Was tested for flu the first week of her symptoms which resulted negative.     GAD 7 : Generalized Anxiety Score 04/08/2021  Nervous, Anxious, on Edge 0  Control/stop worrying 0  Worry too much - different things 0  Trouble relaxing 0  Restless 0   Easily annoyed or irritable 0  Afraid - awful might happen 0  Total GAD 7 Score 0    Depression screen [facility] 2/9 04/08/2021 09/09/2020 01/08/2020 03/20/2019 03/13/2019  Decreased Interest 0 0 0 0 0  Down, Depressed, Hopeless 0 0 0 0 0  PHQ - 2 Score 0 0 0 0 0  Altered sleeping 0 0 0 0 0  Tired, decreased energy 0 0 1 0 0  Change in appetite 0 0 0 0 0  Feeling bad or failure about yourself  0 0 0 0 0  Trouble concentrating 0 0 0 0 0  Moving slowly or fidgety/restless 0 0 0 0 0  Suicidal thoughts 0 0 0 0 0  PHQ-9 Score 0 0 1 0 0  Difficult doing work/chores - - Not difficult at all - -  Some recent data might be hidden      Impression and Recommendations:     1. Bacterial respiratory infection   2. Otitis externa of both ears, unspecified chronicity, unspecified type     Discussed with patient symptoms have been ongoing for >14 days with mild improvement and possibly doxycycline duration was not long enough to treat for bacterial pneumonia.  Patient prefers to start oral antibiotic before considering chest x-ray for further evaluation.  Will start azithromycin (pt has previously tolerated w/o issues). Pt recently completed corticosteroid therapy so will defer at this time. Will start empiric treatment for possible otitis externa. Advised to continue with decongestant and recommend other supportive care such as use of humidifier. Advised patient if symptoms  fail to improve or worsen recommend obtaining chest x-ray, patient verbalized understanding.    - As part of my medical decision making, I reviewed the following data within the [place] History obtained from pt /family, CMA notes reviewed and incorporated if applicable, Labs reviewed, Radiograph/ tests reviewed if applicable and OV notes from prior OV's with me, as well as any other specialists she/he has seen since seeing me last, were all reviewed and used in my medical decision making process today.    - Additionally,  when appropriate, discussion had with patient regarding our treatment plan, and their biases/concerns about that plan were used in my medical decision making today.    - The patient agreed with the plan and demonstrated an understanding of the instructions.   No barriers to understanding were identified.     - The patient was advised to call back or seek an in-person evaluation if the symptoms worsen or if the condition fails to improve as anticipated.   Return if symptoms worsen or fail to improve.    No orders of the defined types were placed in this encounter.   Meds ordered this encounter  Medications   azithromycin (ZITHROMAX) 250 MG tablet    Sig: Take 2 tablets on day 1, then 1 tablet daily on days 2 through 5    Dispense:  6 tablet    Refill:  0    Order Specific Question:   Supervising Provider    Answer:   Beatrice Lecher D [[id number]]   acetic acid-hydrocortisone (VOSOL-HC) OTIC solution    Sig: Place 4 drops into both ears 3 (three) times daily. For 7-10 days.    Dispense:  10 mL    Refill:  0    Order Specific Question:   Supervising Provider    Answer:   Beatrice Lecher D [[id number]]    Medications Discontinued During This Encounter  Medication Reason   amoxicillin-clavulanate (AUGMENTIN) 875-125 MG tablet Completed Course   fluticasone (FLONASE) 50 MCG/ACT nasal spray Patient Preference       Time spent on telephone encounter was 11 minutes.   Note:  This note was prepared with assistance of Dragon voice recognition software. Occasional wrong-word or sound-a-like substitutions may have occurred due to the inherent limitations of voice recognition software.    The [place] was signed into law in 2016 which includes the topic of electronic health records.  This provides immediate access to information in MyChart.  This includes consultation notes, operative notes, office notes, lab results and pathology reports.  If you have any questions about  what you read please let us know at your next visit or call us at the office.  We are right here with you.   __________________________________________________________________________________     Patient Care Team    Relationship Specialty Notifications Start End  Carol Jenkins, [place] PCP - General Physician Assistant  05/03/20      -Vitals obtained; medications/ allergies reconciled;  personal medical, social, Sx etc.histories were updated by CMA, reviewed by me and are reflected in chart   Patient Active Problem List   Diagnosis Date Noted   Unilateral primary osteoarthritis, right knee 06/07/2020   Return to work evaluation 06/05/2019   Contact dermatitis 03/20/2019   Work related injury 03/20/2019   BMI 34.0-34.9,adult 03/13/2019   Blood in stool 08/22/2018   Elevated LDL cholesterol level 08/22/2018   Chest tightness 02/26/2018   Low libido 02/26/2018   Nausea 11/21/2017  S/P hysterectomy with oophorectomy 2014- endometriosis 05/28/2017   Status post colonoscopy with polypectomy 05/28/2017   H/O mammogram- 7/17- N 05/28/2017   Epigastric pain- chronic pain 05/28/2017   Fatigue 05/28/2017   Healthcare maintenance 01/30/2017   Toe pain, bilateral 01/30/2017   Tendonitis, Achilles, left 01/30/2017   Hypothyroid 01/30/2017   Fibromyalgia 01/30/2017     Current Meds  Medication Sig   acetic acid-hydrocortisone (VOSOL-HC) OTIC solution Place 4 drops into both ears 3 (three) times daily. For 7-10 days.   azithromycin (ZITHROMAX) 250 MG tablet Take 2 tablets on day 1, then 1 tablet daily on days 2 through 5     Allergies:  Allergies  Allergen Reactions   Shellfish Allergy Shortness Of Breath    Facial swelling     ROS:  See above HPI for pertinent positives and negatives   Objective:   Height 5\' 6"  (1.676 m), weight 220 lb (99.8 kg).  (if some vitals are omitted, this means that patient was UNABLE to obtain them. ) General: A & O * 3; sounds in no acute  distress Respiratory: speaking in full sentences, no conversational dyspnea Psych: insight appears good, mood- appears full

## 2021-09-26 ENCOUNTER — Other Ambulatory Visit: Payer: Self-pay

## 2021-09-26 ENCOUNTER — Encounter: Payer: Self-pay | Admitting: Physician Assistant

## 2021-09-26 ENCOUNTER — Ambulatory Visit (INDEPENDENT_AMBULATORY_CARE_PROVIDER_SITE_OTHER): Payer: 59 | Admitting: Physician Assistant

## 2021-09-26 VITALS — BP 113/72 | HR 84 | Temp 98.3°F | Ht 66.0 in | Wt 224.0 lb

## 2021-09-26 DIAGNOSIS — R059 Cough, unspecified: Secondary | ICD-10-CM | POA: Diagnosis not present

## 2021-09-26 DIAGNOSIS — H6593 Unspecified nonsuppurative otitis media, bilateral: Secondary | ICD-10-CM

## 2021-09-26 DIAGNOSIS — M545 Low back pain, unspecified: Secondary | ICD-10-CM

## 2021-09-26 LAB — POCT URINALYSIS DIPSTICK
Bilirubin, UA: NEGATIVE
Glucose, UA: NEGATIVE
Ketones, UA: NEGATIVE
Leukocytes, UA: NEGATIVE
Nitrite, UA: NEGATIVE
Protein, UA: NEGATIVE
Spec Grav, UA: 1.02 (ref 1.010–1.025)
Urobilinogen, UA: 0.2 E.U./dL
pH, UA: 5.5 (ref 5.0–8.0)

## 2021-09-26 NOTE — Patient Instructions (Signed)

## 2021-09-26 NOTE — Progress Notes (Signed)
Acute Office Visit  Subjective:    Patient ID: Carol Jenkins, female    DOB: 05/29/1966, 56 y.o.   MRN: [id number]  Chief Complaint  Patient presents with   Acute Visit    UTI   Cough    HPI Patient is in today for multiple complaints. Continues to have cough since December. Reports cough has been better since yesterday. It used to be constant but has been more intermittent. No wheezing within the last week. Denies nasal congestion or sinus pressure. No fever. Reports bilateral earache more on the right ear than left. No otorrhea. States symptoms get better and then seem to get worse. Feels like has not been able to completely get over with her respiratory symptoms since Covid last year. Takes Mucinex as needed which helps with the cough and clearing her chest. Patient states has a hx of UTI and her last one was about 5 years ago. Last week had back pain and noticed her urine was dark. Also reports an episode of sharp right flank pain which lasted about 30 minutes. Last week also had significant nausea. States she increased her water intake which helped with improve her symptoms. Denies burning with urination or increased urinary frequency from baseline.   Past Medical History:  Diagnosis Date   Fatty liver 06/2016   Fibromyalgia    Gastric polyp    Gastritis    Headache syndrome    hemiparegic   Hyperlipidemia    Hypothyroidism    Liver cyst    Migraines    Neuromuscular disorder (HCC)    Fibromyalgia   Partial tear of Achilles tendon    Thyroid disease    Tubular adenoma of colon     Past Surgical History:  Procedure Laterality Date   ABDOMINAL HYSTERECTOMY     CRUCIATE LIGAMENT REPAIR     CYSTOSCOPY     DILATION AND CURETTAGE OF UTERUS     NASAL SEPTUM SURGERY     PARTIAL HYSTERECTOMY     with laparoscopy- completed hysterectomy (previously had left salpingo-oophorectomy)   SALPINGOOPHORECTOMY Left    TUBAL LIGATION      Family History  Problem Relation  Age of Onset   Alcohol abuse Mother    Hyperlipidemia Mother    Hypertension Mother    Alcohol abuse Father    Hyperlipidemia Father    Hypertension Father    Alcohol abuse Brother    Diabetes Son    Lung cancer Maternal Uncle    Heart attack Paternal Uncle    Hypothyroidism Paternal Uncle    Alcohol abuse Maternal Grandmother    Hyperlipidemia Maternal Grandmother    Hypertension Maternal Grandmother    Alcohol abuse Maternal Grandfather    Cancer Paternal Grandmother        abdominal   Heart attack Paternal Grandfather    Alcohol abuse Brother    Stomach cancer Maternal Uncle    Heart attack Paternal Uncle    Hypothyroidism Paternal Uncle    Heart attack Paternal Uncle    Hypothyroidism Paternal Uncle     Social History   Socioeconomic History   Marital status: Married    Spouse name: Not on file   Number of children: 2   Years of education: Not on file   Highest education level: Not on file  Occupational History   Occupation: nurse  Tobacco Use   Smoking status: Former    Packs/day: 1.00    Years: 15.00    Pack years: 15.00  Types: Cigarettes    Quit date: 58    Years since quitting: 24.0   Smokeless tobacco: Never  Vaping Use   Vaping Use: Never used  Substance and Sexual Activity   Alcohol use: No   Drug use: No   Sexual activity: Not Currently    Birth control/protection: Surgical  Other Topics Concern   Not on file  Social History Narrative   Not on file   Social Determinants of Health   Financial Resource Strain: Not on file  Food Insecurity: Not on file  Transportation Needs: Not on file  Physical Activity: Not on file  Stress: Not on file  Social Connections: Not on file  Intimate Partner Violence: Not on file    Outpatient Medications Prior to Visit  Medication Sig Dispense Refill   acetic acid-hydrocortisone (VOSOL-HC) OTIC solution Place 4 drops into both ears 3 (three) times daily. For 7-10 days. 10 mL 0   albuterol (VENTOLIN  HFA) 108 (90 Base) MCG/ACT inhaler Inhale into the lungs.     Ascorbic Acid (VITAMIN C PO) Take by mouth.     Cholecalciferol (VITAMIN D3) 2000 units capsule Take 4,000 Units by mouth daily.     Cyanocobalamin (B-12 PO) Take by mouth.     Magnesium 100 MG TABS      SYNTHROID 50 MCG tablet TAKE 2 TABLETS BY MOUTH IN THE MORNING BEFORE BREAKFAST. 180 tablet 1   No facility-administered medications prior to visit.    Allergies  Allergen Reactions   Shellfish Allergy Shortness Of Breath    Facial swelling    Review of Systems Review of Systems:  A fourteen system review of systems was performed and found to be positive as per HPI.    Objective:    Physical Exam General:  Well Developed, well nourished, appropriate for stated age.  Neuro:  Alert and oriented,  extra-ocular muscles intact  HEENT:  Normocephalic, atraumatic, neck supple, no sinus tenderness, PERRL, serous fluid of both TM's without swelling, mild erythema of both external ear canals, +Tragus sign (right ear), pink and moist nasal mucosa, no posterior oropharynx erythema Abdomen: Non-distended, no CVA tenderness   Skin:  no gross rash, warm, pink. Cardiac:  RRR, S1 S2 Respiratory:  Rhonchi at lung bases, no wheezing, crackles or rales, Not using accessory muscles, speaking in full sentences- unlabored. Vascular:  Ext warm, no cyanosis apprec.; cap RF less 2 sec. Psych:  No HI/SI, judgement and insight good, Euthymic mood. Full Affect.  BP 113/72    Pulse 84    Temp 98.3 F (36.8 C)    Ht _0  (1.676 m)    Wt 224 lb (101.6 kg)    SpO2 99%    BMI 36.15 kg/m  Wt Readings from Last 3 Encounters:  09/26/21 224 lb (101.6 kg)  09/07/21 220 lb (99.8 kg)  04/08/21 220 lb 4.8 oz (99.9 kg)    Health Maintenance Due  Topic Date Due   PAP SMEAR-Modifier  Never done   Zoster Vaccines- Shingrix (1 of 2) Never done   COVID-19 Vaccine (3 - Booster for Moderna series) 02/23/2020   INFLUENZA VACCINE  04/04/2021    There are  no preventive care reminders to display for this patient.   Lab Results  Component Value Date   TSH 0.592 04/08/2021   Lab Results  Component Value Date   WBC 6.8 04/08/2021   HGB 13.7 04/08/2021   HCT 41.2 04/08/2021   MCV 88 04/08/2021   PLT 343  04/08/2021   Lab Results  Component Value Date   NA 140 04/08/2021   K 4.4 04/08/2021   CO2 24 04/08/2021   GLUCOSE 91 04/08/2021   BUN 13 04/08/2021   CREATININE 0.79 04/08/2021   BILITOT 0.5 04/08/2021   ALKPHOS 94 04/08/2021   AST 18 04/08/2021   ALT 18 04/08/2021   PROT 7.1 04/08/2021   ALBUMIN 4.4 04/08/2021   CALCIUM 10.2 04/08/2021   ANIONGAP 9 03/23/2019   EGFR 87 04/08/2021   Lab Results  Component Value Date   CHOL 229 (H) 04/08/2021   Lab Results  Component Value Date   HDL 60 04/08/2021   Lab Results  Component Value Date   LDLCALC 150 (H) 04/08/2021   Lab Results  Component Value Date   TRIG 106 04/08/2021   Lab Results  Component Value Date   CHOLHDL 3.8 04/08/2021   Lab Results  Component Value Date   HGBA1C 5.4 04/08/2021       Assessment & Plan:   Problem List Items Addressed This Visit   None Visit Diagnoses     Cough, unspecified type    -  Primary   Relevant Orders   DG Chest 2 View   CBC with Differential/Platelet   Acute bilateral low back pain without sciatica       Relevant Orders   POCT urinalysis dipstick (Completed)   Fluid level behind tympanic membrane of both ears          Cough, Fluid level behind tympanic membrane of both ears: -Ongoing cough for about 8 weeks without complete resolution. Symptoms have improved. Patient has been treated for bronchitis and bacterial respiratory infection with corticosteroid therapy and antibiotics (doxycycline and Azithromycin). Recommend further evaluation with chest x-ray, patient is agreeable. Pending chest x-ray results will adjust treatment plan if indicated. Recommend to continue with decongestant as needed. Recommend to use  Flonase or Nasacort for ear symptoms and fluid and/or oral antihistamine. Discussed possible environmental allergen contributing to cough.  -Will collect CBC to evaluate for leukocytosis.   Acute bilateral low back pain: -Resolved. UA collected. Reassurance provided no signs of UTI. Advised trace of blood which raises suspicious for possible kidney stone. If symptoms re-occur then recommend further evaluation. Recommend to continue with increased water intake and stay well hydrated. Patient verbalized understanding.   No orders of the defined types were placed in this encounter.    Lorrene Reid, PA-C

## 2021-09-27 ENCOUNTER — Ambulatory Visit
Admission: RE | Admit: 2021-09-27 | Discharge: 2021-09-27 | Disposition: A | Discharge status: Home / Self Care | Payer: 59 | Source: Ambulatory Visit | Attending: Physician Assistant | Admitting: Physician Assistant

## 2021-09-27 DIAGNOSIS — R059 Cough, unspecified: Secondary | ICD-10-CM

## 2021-09-27 LAB — CBC WITH DIFFERENTIAL/PLATELET
Basophils Absolute: 0.1 10*3/uL (ref 0.0–0.2)
Basos: 1 %
EOS (ABSOLUTE): 0.1 10*3/uL (ref 0.0–0.4)
Eos: 2 %
Hematocrit: 41 % (ref 34.0–46.6)
Hemoglobin: 14 g/dL (ref 11.1–15.9)
Immature Grans (Abs): 0 10*3/uL (ref 0.0–0.1)
Immature Granulocytes: 0 %
Lymphocytes Absolute: 2.8 10*3/uL (ref 0.7–3.1)
Lymphs: 36 %
MCH: 29.9 pg (ref 26.6–33.0)
MCHC: 34.1 g/dL (ref 31.5–35.7)
MCV: 87 fL (ref 79–97)
Monocytes Absolute: 0.6 10*3/uL (ref 0.1–0.9)
Monocytes: 7 %
Neutrophils Absolute: 4.2 10*3/uL (ref 1.4–7.0)
Neutrophils: 54 %
Platelets: 360 10*3/uL (ref 150–450)
RBC: 4.69 x10E6/uL (ref 3.77–5.28)
RDW: 12.6 % (ref 11.7–15.4)
WBC: 7.8 10*3/uL (ref 3.4–10.8)

## 2021-09-28 ENCOUNTER — Encounter: Payer: Self-pay | Admitting: Physician Assistant

## 2021-09-28 DIAGNOSIS — R9389 Abnormal findings on diagnostic imaging of other specified body structures: Secondary | ICD-10-CM

## 2021-10-04 ENCOUNTER — Ambulatory Visit
Admission: RE | Admit: 2021-10-04 | Discharge: 2021-10-04 | Disposition: A | Discharge status: Home / Self Care | Payer: 59 | Source: Ambulatory Visit | Attending: Physician Assistant | Admitting: Physician Assistant

## 2021-10-04 DIAGNOSIS — R9389 Abnormal findings on diagnostic imaging of other specified body structures: Secondary | ICD-10-CM

## 2021-10-04 DIAGNOSIS — R918 Other nonspecific abnormal finding of lung field: Secondary | ICD-10-CM | POA: Diagnosis not present

## 2021-10-04 DIAGNOSIS — J984 Other disorders of lung: Secondary | ICD-10-CM | POA: Diagnosis not present

## 2021-10-27 DIAGNOSIS — Z90710 Acquired absence of both cervix and uterus: Secondary | ICD-10-CM | POA: Diagnosis not present

## 2021-10-27 DIAGNOSIS — M797 Fibromyalgia: Secondary | ICD-10-CM | POA: Diagnosis not present

## 2021-10-27 DIAGNOSIS — Z90721 Acquired absence of ovaries, unilateral: Secondary | ICD-10-CM | POA: Diagnosis not present

## 2021-10-27 DIAGNOSIS — E7800 Pure hypercholesterolemia, unspecified: Secondary | ICD-10-CM | POA: Diagnosis not present

## 2021-10-27 DIAGNOSIS — E039 Hypothyroidism, unspecified: Secondary | ICD-10-CM | POA: Diagnosis not present

## 2021-10-27 DIAGNOSIS — Z79899 Other long term (current) drug therapy: Secondary | ICD-10-CM | POA: Diagnosis not present

## 2021-10-27 DIAGNOSIS — Z7689 Persons encountering health services in other specified circumstances: Secondary | ICD-10-CM | POA: Diagnosis not present

## 2021-11-21 ENCOUNTER — Encounter: Payer: Self-pay | Admitting: Physician Assistant

## 2021-12-08 ENCOUNTER — Other Ambulatory Visit: Payer: Self-pay | Admitting: Family Medicine

## 2021-12-08 DIAGNOSIS — Z1231 Encounter for screening mammogram for malignant neoplasm of breast: Secondary | ICD-10-CM

## 2021-12-15 ENCOUNTER — Ambulatory Visit
Admission: RE | Admit: 2021-12-15 | Discharge: 2021-12-15 | Disposition: A | Discharge status: Home / Self Care | Payer: 59 | Source: Ambulatory Visit | Attending: Family Medicine | Admitting: Family Medicine

## 2021-12-15 DIAGNOSIS — Z1231 Encounter for screening mammogram for malignant neoplasm of breast: Secondary | ICD-10-CM

## 2022-05-11 DIAGNOSIS — E559 Vitamin D deficiency, unspecified: Secondary | ICD-10-CM | POA: Diagnosis not present

## 2022-05-11 DIAGNOSIS — Z0000 Encounter for general adult medical examination without abnormal findings: Secondary | ICD-10-CM | POA: Diagnosis not present

## 2022-05-11 DIAGNOSIS — Z130 Encounter for screening for diseases of the blood and blood-forming organs and certain disorders involving the immune mechanism: Secondary | ICD-10-CM | POA: Diagnosis not present

## 2022-05-11 DIAGNOSIS — E538 Deficiency of other specified B group vitamins: Secondary | ICD-10-CM | POA: Diagnosis not present

## 2022-05-11 DIAGNOSIS — Z13220 Encounter for screening for lipoid disorders: Secondary | ICD-10-CM | POA: Diagnosis not present

## 2022-05-11 DIAGNOSIS — Z1329 Encounter for screening for other suspected endocrine disorder: Secondary | ICD-10-CM | POA: Diagnosis not present

## 2022-05-11 DIAGNOSIS — E039 Hypothyroidism, unspecified: Secondary | ICD-10-CM | POA: Diagnosis not present

## 2022-05-11 DIAGNOSIS — Z79890 Hormone replacement therapy (postmenopausal): Secondary | ICD-10-CM | POA: Diagnosis not present

## 2022-05-11 DIAGNOSIS — Z13228 Encounter for screening for other metabolic disorders: Secondary | ICD-10-CM | POA: Diagnosis not present

## 2022-05-11 DIAGNOSIS — R7989 Other specified abnormal findings of blood chemistry: Secondary | ICD-10-CM | POA: Diagnosis not present

## 2022-07-06 DIAGNOSIS — E039 Hypothyroidism, unspecified: Secondary | ICD-10-CM | POA: Diagnosis not present

## 2022-08-22 IMAGING — MR MR KNEE*R* W/O CM
7 series · 40 of 40 positions shown · non-contrast
Comparison: Right knee x-rays dated May 24, 2020.

CLINICAL DATA: Medial knee pain and weakness since grandson jumped
on her right knee 3 months ago. No prior surgery.

EXAM:
MRI OF THE RIGHT KNEE WITHOUT CONTRAST
TECHNIQUE: Multiplanar, multisequence MR imaging of the knee was performed. No
intravenous contrast was administered.

[Series 3: T2 fat-sat · axial · 4.0mm · 0.53mm/px · z∈[-101,+69]mm · 6 of 35 slices shown (1 of 3)]
[im 1/35]
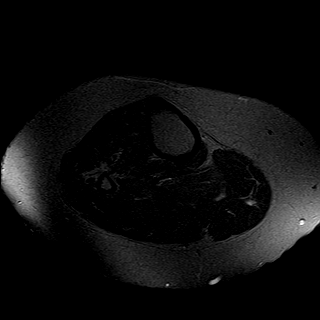
[im 7/35]
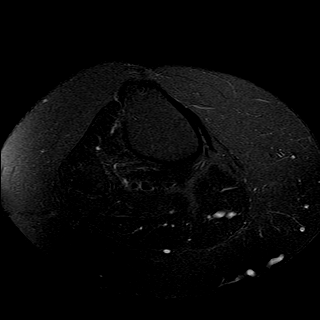
[im 14/35]
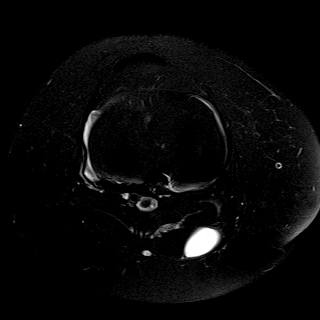
[im 21/35]
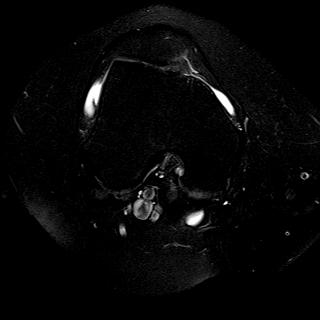
[im 28/35]
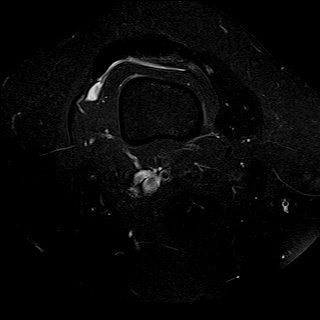
[im 35/35]
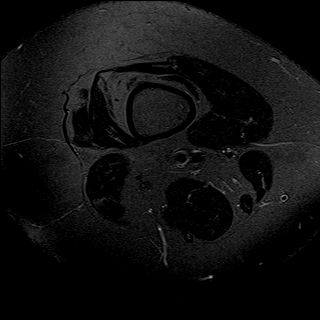

[Series 4: T1 · coronal · 4.0mm · 0.62mm/px · 6 of 29 slices shown]
[im 1/29]
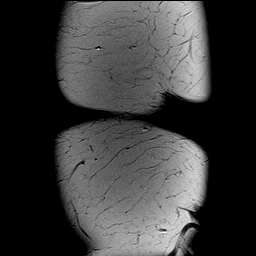
[im 6/29]
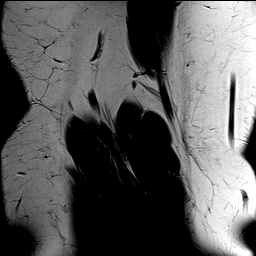
[im 12/29]
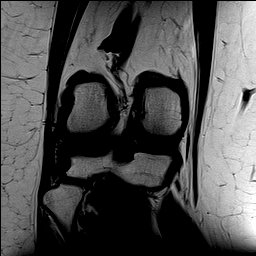
[im 17/29]
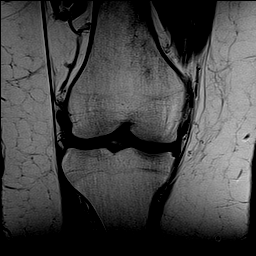
[im 23/29]
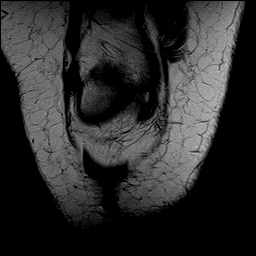
[im 29/29]
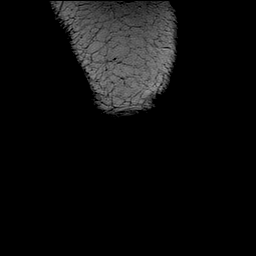

[Series 5: T2 fat-sat · coronal · 4.0mm · 0.62mm/px · 6 of 29 slices shown (2 of 3)]
[im 1/29]
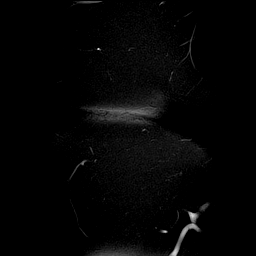
[im 6/29]
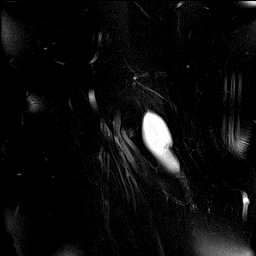
[im 12/29]
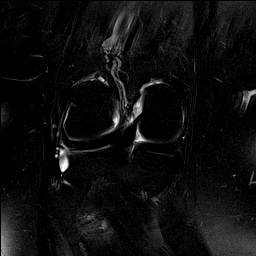
[im 17/29]
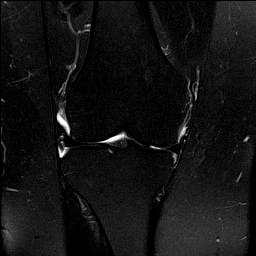
[im 23/29]
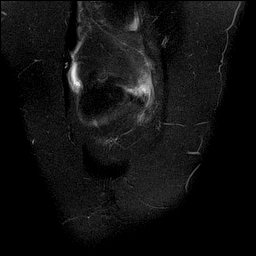
[im 29/29]
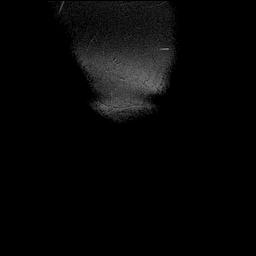

[Series 6: PD fat-sat · coronal · 4.0mm · 0.62mm/px · 6 of 28 slices shown (1 of 3)]
[im 1/28]
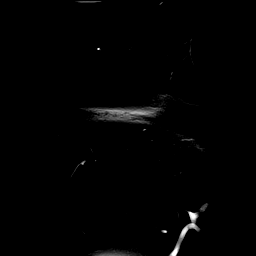
[im 6/28]
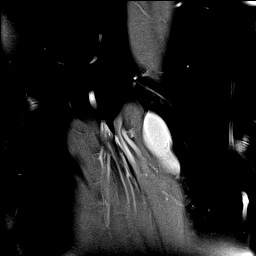
[im 11/28]
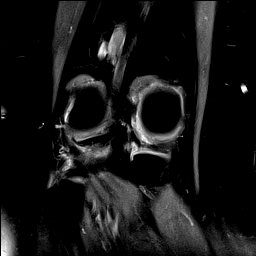
[im 17/28]
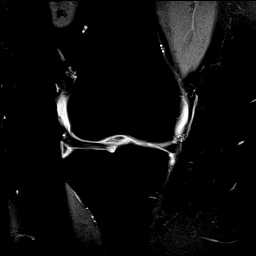
[im 22/28]
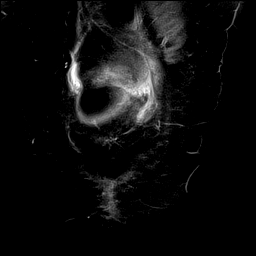
[im 28/28]
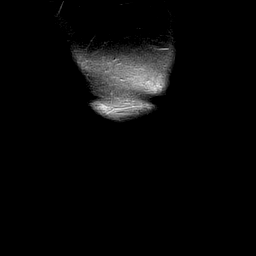

[Series 7: PD fat-sat · sagittal · 3.0mm · 0.62mm/px · 6 of 30 slices shown (2 of 3)]
[im 1/30]
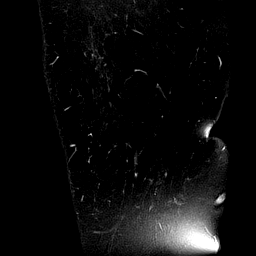
[im 6/30]
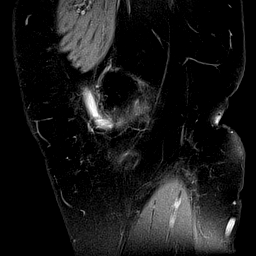
[im 12/30]
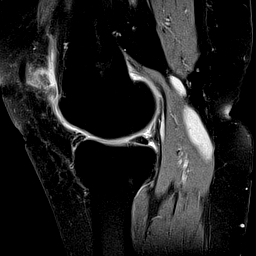
[im 18/30]
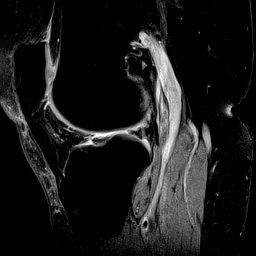
[im 24/30]
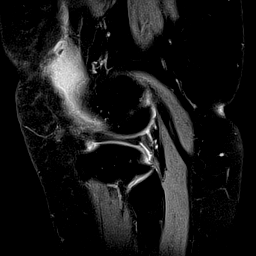
[im 30/30]
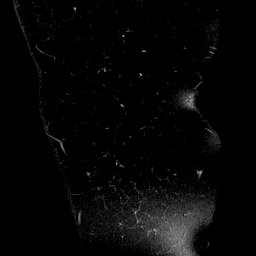

[Series 8: T2 fat-sat · sagittal · 3.0mm · 0.62mm/px · 6 of 30 slices shown (3 of 3)]
[im 1/30]
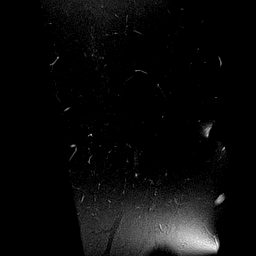
[im 6/30]
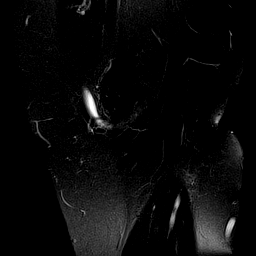
[im 12/30]
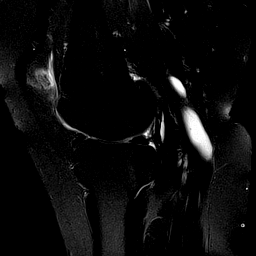
[im 18/30]
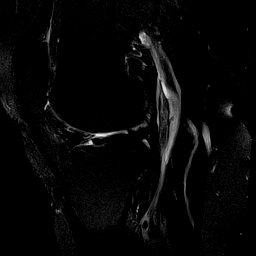
[im 24/30]
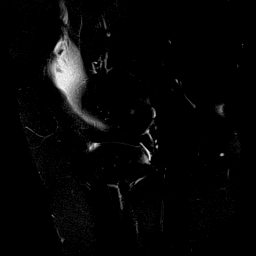
[im 30/30]
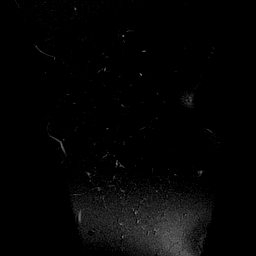

[Series 9: PD fat-sat · coronal · 2.0mm · 0.62mm/px · 4 of 19 slices shown (3 of 3)]
[im 1/19]
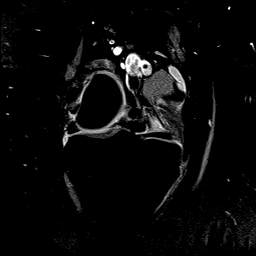
[im 7/19]
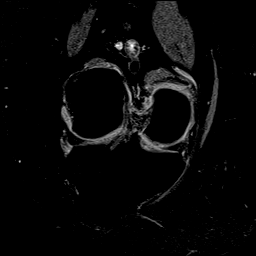
[im 13/19]
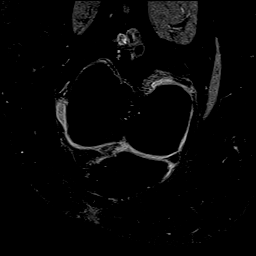
[im 19/19]
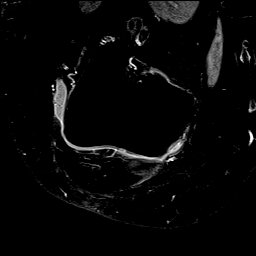

[40 of 40 positions shown; findings below may reference images not displayed]

FINDINGS: MENISCI

Medial meniscus:  Intact.

Lateral meniscus:  Intact.

LIGAMENTS

Cruciates:  Intact ACL and PCL.

Collaterals: Medial collateral ligament is intact. Lateral
collateral ligament complex is intact.

CARTILAGE

Patellofemoral: Tiny full-thickness cartilage defect over the
superior trochlear groove.

Medial: High-grade partial and near full-thickness cartilage loss
over the central weight-bearing medial femoral condyle.

Lateral:  No chondral defect.

Joint:  Trace joint effusion.  Minimal edema within Hoffa's fat.

Popliteal Fossa:  Small Baker cyst.  Intact popliteus tendon.

Extensor Mechanism: Intact quadriceps tendon and patellar tendon.
Intact medial and lateral patellar retinaculum. Intact MPFL.

Bones: No focal marrow signal abnormality. No fracture or
dislocation.

Other: None.
IMPRESSION: 1. No meniscal or ligamentous injury.
2. Mild medial compartment osteoarthritis.
3. Small Baker cyst.

## 2022-10-08 DIAGNOSIS — J0140 Acute pansinusitis, unspecified: Secondary | ICD-10-CM | POA: Diagnosis not present

## 2022-11-08 DIAGNOSIS — E039 Hypothyroidism, unspecified: Secondary | ICD-10-CM | POA: Diagnosis not present

## 2022-12-18 DIAGNOSIS — R519 Headache, unspecified: Secondary | ICD-10-CM | POA: Diagnosis not present

## 2022-12-18 DIAGNOSIS — R6883 Chills (without fever): Secondary | ICD-10-CM | POA: Diagnosis not present

## 2022-12-18 DIAGNOSIS — R070 Pain in throat: Secondary | ICD-10-CM | POA: Diagnosis not present

## 2022-12-18 DIAGNOSIS — R5383 Other fatigue: Secondary | ICD-10-CM | POA: Diagnosis not present

## 2022-12-18 DIAGNOSIS — R509 Fever, unspecified: Secondary | ICD-10-CM | POA: Diagnosis not present

## 2022-12-18 DIAGNOSIS — H9203 Otalgia, bilateral: Secondary | ICD-10-CM | POA: Diagnosis not present

## 2022-12-18 DIAGNOSIS — M791 Myalgia, unspecified site: Secondary | ICD-10-CM | POA: Diagnosis not present

## 2022-12-19 DIAGNOSIS — R519 Headache, unspecified: Secondary | ICD-10-CM | POA: Diagnosis not present

## 2022-12-19 DIAGNOSIS — H9203 Otalgia, bilateral: Secondary | ICD-10-CM | POA: Diagnosis not present

## 2022-12-19 DIAGNOSIS — R509 Fever, unspecified: Secondary | ICD-10-CM | POA: Diagnosis not present

## 2022-12-19 DIAGNOSIS — M791 Myalgia, unspecified site: Secondary | ICD-10-CM | POA: Diagnosis not present

## 2022-12-19 DIAGNOSIS — R070 Pain in throat: Secondary | ICD-10-CM | POA: Diagnosis not present

## 2022-12-19 DIAGNOSIS — R5383 Other fatigue: Secondary | ICD-10-CM | POA: Diagnosis not present

## 2022-12-19 DIAGNOSIS — R6883 Chills (without fever): Secondary | ICD-10-CM | POA: Diagnosis not present
